# Patient Record
Sex: Female | Born: 1955
Health system: Southern US, Community
[De-identification: ages and names within clinical notes are randomized; demographics above are authoritative.]

## PROBLEM LIST (undated history)

## (undated) DIAGNOSIS — E119 Type 2 diabetes mellitus without complications: Secondary | ICD-10-CM

## (undated) DIAGNOSIS — D219 Benign neoplasm of connective and other soft tissue, unspecified: Secondary | ICD-10-CM

## (undated) DIAGNOSIS — M1990 Unspecified osteoarthritis, unspecified site: Secondary | ICD-10-CM

## (undated) DIAGNOSIS — R51 Headache: Secondary | ICD-10-CM

## (undated) DIAGNOSIS — Z8719 Personal history of other diseases of the digestive system: Secondary | ICD-10-CM

## (undated) DIAGNOSIS — Z86718 Personal history of other venous thrombosis and embolism: Secondary | ICD-10-CM

## (undated) DIAGNOSIS — Z8489 Family history of other specified conditions: Secondary | ICD-10-CM

## (undated) DIAGNOSIS — N870 Mild cervical dysplasia: Secondary | ICD-10-CM

## (undated) DIAGNOSIS — I83899 Varicose veins of unspecified lower extremities with other complications: Secondary | ICD-10-CM

## (undated) DIAGNOSIS — H269 Unspecified cataract: Secondary | ICD-10-CM

## (undated) DIAGNOSIS — L719 Rosacea, unspecified: Secondary | ICD-10-CM

## (undated) DIAGNOSIS — Z85528 Personal history of other malignant neoplasm of kidney: Secondary | ICD-10-CM

## (undated) DIAGNOSIS — E041 Nontoxic single thyroid nodule: Secondary | ICD-10-CM

## (undated) DIAGNOSIS — D131 Benign neoplasm of stomach: Secondary | ICD-10-CM

## (undated) DIAGNOSIS — R350 Frequency of micturition: Secondary | ICD-10-CM

## (undated) DIAGNOSIS — J309 Allergic rhinitis, unspecified: Secondary | ICD-10-CM

## (undated) DIAGNOSIS — E042 Nontoxic multinodular goiter: Secondary | ICD-10-CM

## (undated) DIAGNOSIS — R002 Palpitations: Secondary | ICD-10-CM

## (undated) DIAGNOSIS — E785 Hyperlipidemia, unspecified: Secondary | ICD-10-CM

## (undated) DIAGNOSIS — N2889 Other specified disorders of kidney and ureter: Secondary | ICD-10-CM

## (undated) DIAGNOSIS — F411 Generalized anxiety disorder: Secondary | ICD-10-CM

## (undated) DIAGNOSIS — R197 Diarrhea, unspecified: Secondary | ICD-10-CM

## (undated) DIAGNOSIS — D126 Benign neoplasm of colon, unspecified: Secondary | ICD-10-CM

## (undated) DIAGNOSIS — L900 Lichen sclerosus et atrophicus: Secondary | ICD-10-CM

## (undated) DIAGNOSIS — I10 Essential (primary) hypertension: Secondary | ICD-10-CM

## (undated) DIAGNOSIS — M8580 Other specified disorders of bone density and structure, unspecified site: Secondary | ICD-10-CM

## (undated) DIAGNOSIS — K219 Gastro-esophageal reflux disease without esophagitis: Secondary | ICD-10-CM

## (undated) DIAGNOSIS — N390 Urinary tract infection, site not specified: Secondary | ICD-10-CM

## (undated) HISTORY — DX: Benign neoplasm of connective and other soft tissue, unspecified: D21.9

## (undated) HISTORY — DX: Personal history of other venous thrombosis and embolism: Z86.718

## (undated) HISTORY — DX: Allergic rhinitis, unspecified: J30.9

## (undated) HISTORY — DX: Nontoxic multinodular goiter: E04.2

## (undated) HISTORY — DX: Rosacea, unspecified: L71.9

## (undated) HISTORY — DX: Diarrhea, unspecified: R19.7

## (undated) HISTORY — DX: Unspecified cataract: H26.9

## (undated) HISTORY — DX: Unspecified osteoarthritis, unspecified site: M19.90

## (undated) HISTORY — DX: Personal history of other malignant neoplasm of kidney: Z85.528

## (undated) HISTORY — DX: Benign neoplasm of stomach: D13.1

## (undated) HISTORY — PX: OTHER SURGICAL HISTORY: SHX169

## (undated) HISTORY — DX: Frequency of micturition: R35.0

## (undated) HISTORY — DX: Type 2 diabetes mellitus without complications: E11.9

## (undated) HISTORY — DX: Urinary tract infection, site not specified: N39.0

## (undated) HISTORY — DX: Benign neoplasm of colon, unspecified: D12.6

## (undated) HISTORY — DX: Palpitations: R00.2

## (undated) HISTORY — DX: Varicose veins of unspecified lower extremity with other complications: I83.899

## (undated) HISTORY — DX: Lichen sclerosus et atrophicus: L90.0

## (undated) HISTORY — DX: Mild cervical dysplasia: N87.0

## (undated) HISTORY — PX: COLPOSCOPY: SHX161

## (undated) HISTORY — DX: Essential (primary) hypertension: I10

## (undated) HISTORY — PX: BIOPSY THYROID: PRO38

## (undated) HISTORY — DX: Hyperlipidemia, unspecified: E78.5

## (undated) HISTORY — DX: Other specified disorders of bone density and structure, unspecified site: M85.80

## (undated) HISTORY — DX: Generalized anxiety disorder: F41.1

## (undated) HISTORY — DX: Gastro-esophageal reflux disease without esophagitis: K21.9

## (undated) HISTORY — DX: Nontoxic single thyroid nodule: E04.1

## (undated) HISTORY — DX: Personal history of other diseases of the digestive system: Z87.19

## (undated) HISTORY — DX: Headache: R51

---
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
## 1986-09-20 HISTORY — PX: OTHER SURGICAL HISTORY: SHX169

## 1992-09-20 HISTORY — PX: DERMOID CYST  EXCISION: SHX1452

## 1994-09-20 HISTORY — PX: OTHER SURGICAL HISTORY: SHX169

## 1995-11-16 HISTORY — PX: FLEXIBLE SIGMOIDOSCOPY: SHX1649

## 1997-12-23 ENCOUNTER — Ambulatory Visit (HOSPITAL_COMMUNITY): Admission: RE | Admit: 1997-12-23 | Discharge: 1997-12-23 | Payer: Self-pay | Admitting: Obstetrics and Gynecology

## 1999-05-29 ENCOUNTER — Encounter: Payer: Self-pay | Admitting: Obstetrics and Gynecology

## 1999-05-29 ENCOUNTER — Ambulatory Visit (HOSPITAL_COMMUNITY): Admission: RE | Admit: 1999-05-29 | Discharge: 1999-05-29 | Payer: Self-pay | Admitting: Obstetrics and Gynecology

## 1999-07-27 ENCOUNTER — Other Ambulatory Visit: Admission: RE | Admit: 1999-07-27 | Discharge: 1999-07-27 | Payer: Self-pay | Admitting: Obstetrics and Gynecology

## 1999-10-01 ENCOUNTER — Ambulatory Visit (HOSPITAL_COMMUNITY): Admission: RE | Admit: 1999-10-01 | Discharge: 1999-10-01 | Payer: Self-pay | Admitting: Gastroenterology

## 2001-01-23 ENCOUNTER — Ambulatory Visit (HOSPITAL_COMMUNITY): Admission: RE | Admit: 2001-01-23 | Discharge: 2001-01-23 | Payer: Self-pay | Admitting: Obstetrics and Gynecology

## 2001-01-23 ENCOUNTER — Encounter: Payer: Self-pay | Admitting: Obstetrics and Gynecology

## 2001-01-25 ENCOUNTER — Encounter: Admission: RE | Admit: 2001-01-25 | Discharge: 2001-01-25 | Payer: Self-pay | Admitting: Obstetrics and Gynecology

## 2001-01-25 ENCOUNTER — Encounter: Payer: Self-pay | Admitting: Obstetrics and Gynecology

## 2001-03-17 ENCOUNTER — Other Ambulatory Visit: Admission: RE | Admit: 2001-03-17 | Discharge: 2001-03-17 | Payer: Self-pay | Admitting: Obstetrics and Gynecology

## 2002-01-11 ENCOUNTER — Encounter (INDEPENDENT_AMBULATORY_CARE_PROVIDER_SITE_OTHER): Payer: Self-pay

## 2002-01-11 ENCOUNTER — Ambulatory Visit (HOSPITAL_COMMUNITY): Admission: RE | Admit: 2002-01-11 | Discharge: 2002-01-11 | Payer: Self-pay | Admitting: Obstetrics and Gynecology

## 2002-01-11 DIAGNOSIS — N840 Polyp of corpus uteri: Secondary | ICD-10-CM | POA: Insufficient documentation

## 2002-04-16 ENCOUNTER — Other Ambulatory Visit: Admission: RE | Admit: 2002-04-16 | Discharge: 2002-04-16 | Payer: Self-pay | Admitting: Obstetrics and Gynecology

## 2002-06-08 ENCOUNTER — Encounter: Admission: RE | Admit: 2002-06-08 | Discharge: 2002-06-08 | Payer: Self-pay | Admitting: Obstetrics and Gynecology

## 2002-06-08 ENCOUNTER — Encounter: Payer: Self-pay | Admitting: Obstetrics and Gynecology

## 2003-05-02 ENCOUNTER — Other Ambulatory Visit: Admission: RE | Admit: 2003-05-02 | Discharge: 2003-05-02 | Payer: Self-pay | Admitting: Obstetrics and Gynecology

## 2003-09-16 ENCOUNTER — Encounter: Admission: RE | Admit: 2003-09-16 | Discharge: 2003-09-16 | Payer: Self-pay | Admitting: Obstetrics and Gynecology

## 2003-09-21 HISTORY — PX: APPENDECTOMY: SHX54

## 2003-09-25 ENCOUNTER — Encounter: Admission: RE | Admit: 2003-09-25 | Discharge: 2003-09-25 | Payer: Self-pay | Admitting: Obstetrics and Gynecology

## 2004-02-17 ENCOUNTER — Encounter: Admission: RE | Admit: 2004-02-17 | Discharge: 2004-02-17 | Payer: Self-pay | Admitting: Internal Medicine

## 2004-05-29 ENCOUNTER — Other Ambulatory Visit: Admission: RE | Admit: 2004-05-29 | Discharge: 2004-05-29 | Payer: Self-pay | Admitting: Obstetrics and Gynecology

## 2004-08-10 ENCOUNTER — Ambulatory Visit: Payer: Self-pay | Admitting: Internal Medicine

## 2004-09-20 HISTORY — PX: OTHER SURGICAL HISTORY: SHX169

## 2004-10-14 ENCOUNTER — Ambulatory Visit: Payer: Self-pay | Admitting: Internal Medicine

## 2004-10-22 ENCOUNTER — Ambulatory Visit: Payer: Self-pay

## 2004-11-23 ENCOUNTER — Ambulatory Visit: Payer: Self-pay | Admitting: Internal Medicine

## 2004-11-23 ENCOUNTER — Encounter: Admission: RE | Admit: 2004-11-23 | Discharge: 2004-11-23 | Payer: Self-pay | Admitting: Obstetrics and Gynecology

## 2005-02-02 ENCOUNTER — Ambulatory Visit: Payer: Self-pay | Admitting: Internal Medicine

## 2005-03-10 ENCOUNTER — Ambulatory Visit: Payer: Self-pay | Admitting: Internal Medicine

## 2005-04-21 ENCOUNTER — Ambulatory Visit: Payer: Self-pay | Admitting: Internal Medicine

## 2005-04-22 ENCOUNTER — Ambulatory Visit: Payer: Self-pay | Admitting: Internal Medicine

## 2005-04-26 ENCOUNTER — Ambulatory Visit: Payer: Self-pay | Admitting: Internal Medicine

## 2005-04-29 ENCOUNTER — Ambulatory Visit: Payer: Self-pay | Admitting: Internal Medicine

## 2005-05-04 ENCOUNTER — Ambulatory Visit: Payer: Self-pay | Admitting: Internal Medicine

## 2005-05-07 ENCOUNTER — Ambulatory Visit: Payer: Self-pay | Admitting: Internal Medicine

## 2005-05-10 ENCOUNTER — Ambulatory Visit: Payer: Self-pay | Admitting: Internal Medicine

## 2005-05-14 ENCOUNTER — Ambulatory Visit: Payer: Self-pay | Admitting: Internal Medicine

## 2005-05-18 ENCOUNTER — Ambulatory Visit: Payer: Self-pay | Admitting: Internal Medicine

## 2005-05-21 ENCOUNTER — Ambulatory Visit: Payer: Self-pay | Admitting: Internal Medicine

## 2005-05-25 ENCOUNTER — Ambulatory Visit: Payer: Self-pay | Admitting: Internal Medicine

## 2005-05-28 ENCOUNTER — Ambulatory Visit: Payer: Self-pay | Admitting: Internal Medicine

## 2005-06-01 ENCOUNTER — Ambulatory Visit: Payer: Self-pay | Admitting: Internal Medicine

## 2005-06-04 ENCOUNTER — Ambulatory Visit: Payer: Self-pay | Admitting: Internal Medicine

## 2005-06-07 ENCOUNTER — Ambulatory Visit: Payer: Self-pay | Admitting: Internal Medicine

## 2005-06-09 ENCOUNTER — Other Ambulatory Visit: Admission: RE | Admit: 2005-06-09 | Discharge: 2005-06-09 | Payer: Self-pay | Admitting: Obstetrics and Gynecology

## 2005-06-09 ENCOUNTER — Ambulatory Visit: Payer: Self-pay | Admitting: Internal Medicine

## 2005-06-11 ENCOUNTER — Ambulatory Visit: Payer: Self-pay | Admitting: Internal Medicine

## 2005-06-14 ENCOUNTER — Ambulatory Visit: Payer: Self-pay | Admitting: Internal Medicine

## 2005-06-21 ENCOUNTER — Ambulatory Visit: Payer: Self-pay | Admitting: Internal Medicine

## 2005-06-25 ENCOUNTER — Ambulatory Visit: Payer: Self-pay | Admitting: Internal Medicine

## 2005-06-28 ENCOUNTER — Ambulatory Visit: Payer: Self-pay | Admitting: Internal Medicine

## 2005-06-29 ENCOUNTER — Ambulatory Visit: Payer: Self-pay | Admitting: Internal Medicine

## 2005-07-02 ENCOUNTER — Ambulatory Visit: Payer: Self-pay | Admitting: Internal Medicine

## 2005-07-05 ENCOUNTER — Ambulatory Visit: Payer: Self-pay | Admitting: Internal Medicine

## 2005-07-09 ENCOUNTER — Ambulatory Visit: Payer: Self-pay | Admitting: Internal Medicine

## 2005-07-12 ENCOUNTER — Ambulatory Visit: Payer: Self-pay | Admitting: Internal Medicine

## 2005-07-16 ENCOUNTER — Ambulatory Visit: Payer: Self-pay | Admitting: Internal Medicine

## 2005-07-19 ENCOUNTER — Ambulatory Visit: Payer: Self-pay | Admitting: Internal Medicine

## 2005-07-23 ENCOUNTER — Ambulatory Visit: Payer: Self-pay | Admitting: Internal Medicine

## 2005-07-26 ENCOUNTER — Ambulatory Visit: Payer: Self-pay | Admitting: Internal Medicine

## 2005-07-30 ENCOUNTER — Ambulatory Visit: Payer: Self-pay | Admitting: Internal Medicine

## 2005-08-02 ENCOUNTER — Ambulatory Visit: Payer: Self-pay | Admitting: Internal Medicine

## 2005-08-09 ENCOUNTER — Ambulatory Visit: Payer: Self-pay | Admitting: Internal Medicine

## 2005-08-16 ENCOUNTER — Ambulatory Visit: Payer: Self-pay | Admitting: Internal Medicine

## 2005-08-23 ENCOUNTER — Ambulatory Visit: Payer: Self-pay | Admitting: Internal Medicine

## 2005-08-30 ENCOUNTER — Ambulatory Visit: Payer: Self-pay | Admitting: Internal Medicine

## 2005-09-02 ENCOUNTER — Ambulatory Visit: Payer: Self-pay | Admitting: Internal Medicine

## 2005-09-06 ENCOUNTER — Ambulatory Visit: Payer: Self-pay | Admitting: Internal Medicine

## 2005-09-14 ENCOUNTER — Ambulatory Visit: Payer: Self-pay | Admitting: Internal Medicine

## 2005-09-20 HISTORY — PX: CYSTOSCOPY: SUR368

## 2005-09-21 ENCOUNTER — Ambulatory Visit: Payer: Self-pay | Admitting: Internal Medicine

## 2005-09-27 ENCOUNTER — Ambulatory Visit: Payer: Self-pay | Admitting: Internal Medicine

## 2005-09-30 ENCOUNTER — Ambulatory Visit: Payer: Self-pay | Admitting: Internal Medicine

## 2005-10-04 ENCOUNTER — Ambulatory Visit: Payer: Self-pay | Admitting: Internal Medicine

## 2005-10-14 ENCOUNTER — Ambulatory Visit: Payer: Self-pay | Admitting: Internal Medicine

## 2005-10-21 ENCOUNTER — Ambulatory Visit: Payer: Self-pay | Admitting: Internal Medicine

## 2005-10-28 ENCOUNTER — Ambulatory Visit: Payer: Self-pay | Admitting: Internal Medicine

## 2005-11-08 ENCOUNTER — Ambulatory Visit: Payer: Self-pay | Admitting: Internal Medicine

## 2005-11-11 ENCOUNTER — Ambulatory Visit: Payer: Self-pay | Admitting: Internal Medicine

## 2005-11-17 ENCOUNTER — Ambulatory Visit: Payer: Self-pay | Admitting: Internal Medicine

## 2005-11-26 ENCOUNTER — Ambulatory Visit: Payer: Self-pay | Admitting: Internal Medicine

## 2005-12-03 ENCOUNTER — Ambulatory Visit: Payer: Self-pay | Admitting: Internal Medicine

## 2005-12-10 ENCOUNTER — Ambulatory Visit: Payer: Self-pay | Admitting: Internal Medicine

## 2005-12-13 ENCOUNTER — Ambulatory Visit: Payer: Self-pay | Admitting: Internal Medicine

## 2005-12-14 ENCOUNTER — Encounter: Admission: RE | Admit: 2005-12-14 | Discharge: 2005-12-14 | Payer: Self-pay | Admitting: Obstetrics and Gynecology

## 2005-12-22 ENCOUNTER — Ambulatory Visit: Payer: Self-pay | Admitting: Internal Medicine

## 2005-12-28 ENCOUNTER — Ambulatory Visit: Payer: Self-pay | Admitting: Internal Medicine

## 2006-01-07 ENCOUNTER — Ambulatory Visit: Payer: Self-pay | Admitting: Internal Medicine

## 2006-01-14 ENCOUNTER — Ambulatory Visit: Payer: Self-pay | Admitting: Internal Medicine

## 2006-01-21 ENCOUNTER — Ambulatory Visit: Payer: Self-pay | Admitting: Internal Medicine

## 2006-01-25 ENCOUNTER — Ambulatory Visit: Payer: Self-pay | Admitting: Internal Medicine

## 2006-01-25 ENCOUNTER — Ambulatory Visit: Payer: Self-pay | Admitting: *Deleted

## 2006-02-11 ENCOUNTER — Ambulatory Visit: Payer: Self-pay | Admitting: Internal Medicine

## 2006-02-15 ENCOUNTER — Ambulatory Visit: Payer: Self-pay | Admitting: Internal Medicine

## 2006-02-25 ENCOUNTER — Ambulatory Visit: Payer: Self-pay | Admitting: Internal Medicine

## 2006-03-03 ENCOUNTER — Ambulatory Visit: Payer: Self-pay | Admitting: Internal Medicine

## 2006-03-11 ENCOUNTER — Ambulatory Visit: Payer: Self-pay | Admitting: Internal Medicine

## 2006-03-24 ENCOUNTER — Ambulatory Visit: Payer: Self-pay | Admitting: Internal Medicine

## 2006-04-01 ENCOUNTER — Ambulatory Visit: Payer: Self-pay | Admitting: Internal Medicine

## 2006-04-08 ENCOUNTER — Ambulatory Visit: Payer: Self-pay | Admitting: Internal Medicine

## 2006-04-15 ENCOUNTER — Ambulatory Visit: Payer: Self-pay | Admitting: Internal Medicine

## 2006-04-29 ENCOUNTER — Ambulatory Visit: Payer: Self-pay | Admitting: Internal Medicine

## 2006-05-06 ENCOUNTER — Ambulatory Visit: Payer: Self-pay | Admitting: Internal Medicine

## 2006-05-13 ENCOUNTER — Ambulatory Visit: Payer: Self-pay | Admitting: Internal Medicine

## 2006-05-20 ENCOUNTER — Ambulatory Visit: Payer: Self-pay | Admitting: Internal Medicine

## 2006-05-24 ENCOUNTER — Ambulatory Visit: Payer: Self-pay | Admitting: Internal Medicine

## 2006-06-10 ENCOUNTER — Other Ambulatory Visit: Admission: RE | Admit: 2006-06-10 | Discharge: 2006-06-10 | Payer: Self-pay | Admitting: Obstetrics and Gynecology

## 2006-06-10 ENCOUNTER — Ambulatory Visit: Payer: Self-pay | Admitting: Internal Medicine

## 2006-06-22 ENCOUNTER — Ambulatory Visit: Payer: Self-pay | Admitting: Internal Medicine

## 2006-06-30 ENCOUNTER — Ambulatory Visit: Payer: Self-pay | Admitting: Internal Medicine

## 2006-07-08 ENCOUNTER — Ambulatory Visit: Payer: Self-pay | Admitting: Internal Medicine

## 2006-07-22 ENCOUNTER — Ambulatory Visit: Payer: Self-pay | Admitting: Internal Medicine

## 2006-07-28 ENCOUNTER — Ambulatory Visit: Payer: Self-pay | Admitting: Internal Medicine

## 2006-08-05 ENCOUNTER — Ambulatory Visit: Payer: Self-pay | Admitting: Internal Medicine

## 2006-08-17 ENCOUNTER — Ambulatory Visit: Payer: Self-pay | Admitting: Internal Medicine

## 2006-08-22 ENCOUNTER — Ambulatory Visit: Payer: Self-pay | Admitting: Internal Medicine

## 2006-08-29 ENCOUNTER — Ambulatory Visit: Payer: Self-pay | Admitting: Internal Medicine

## 2006-09-02 ENCOUNTER — Ambulatory Visit: Payer: Self-pay | Admitting: Internal Medicine

## 2006-09-09 ENCOUNTER — Ambulatory Visit: Payer: Self-pay | Admitting: Internal Medicine

## 2006-09-16 ENCOUNTER — Ambulatory Visit: Payer: Self-pay | Admitting: Internal Medicine

## 2006-09-23 ENCOUNTER — Ambulatory Visit: Payer: Self-pay | Admitting: Internal Medicine

## 2006-09-30 ENCOUNTER — Ambulatory Visit: Payer: Self-pay | Admitting: Internal Medicine

## 2006-10-03 ENCOUNTER — Ambulatory Visit (HOSPITAL_COMMUNITY): Admission: RE | Admit: 2006-10-03 | Discharge: 2006-10-04 | Payer: Self-pay | Admitting: Urology

## 2006-10-14 ENCOUNTER — Ambulatory Visit: Payer: Self-pay | Admitting: Internal Medicine

## 2006-10-18 ENCOUNTER — Ambulatory Visit: Payer: Self-pay | Admitting: Internal Medicine

## 2006-11-04 ENCOUNTER — Ambulatory Visit: Payer: Self-pay | Admitting: Internal Medicine

## 2006-11-08 ENCOUNTER — Ambulatory Visit: Payer: Self-pay | Admitting: Pulmonary Disease

## 2006-11-08 LAB — CONVERTED CEMR LAB
ALT: 48 units/L — ABNORMAL HIGH (ref 0–40)
AST: 45 units/L — ABNORMAL HIGH (ref 0–37)
Albumin: 4.3 g/dL (ref 3.5–5.2)
Alkaline Phosphatase: 95 units/L (ref 39–117)
BUN: 9 mg/dL (ref 6–23)
Basophils Absolute: 0.1 10*3/uL (ref 0.0–0.1)
Basophils Relative: 1.1 % — ABNORMAL HIGH (ref 0.0–1.0)
Bilirubin, Direct: 0.1 mg/dL (ref 0.0–0.3)
CO2: 29 meq/L (ref 19–32)
Calcium: 10.2 mg/dL (ref 8.4–10.5)
Chloride: 105 meq/L (ref 96–112)
Creatinine, Ser: 0.7 mg/dL (ref 0.4–1.2)
Eosinophils Absolute: 0.2 10*3/uL (ref 0.0–0.6)
Eosinophils Relative: 2.6 % (ref 0.0–5.0)
GFR calc Af Amer: 114 mL/min
GFR calc non Af Amer: 94 mL/min
Glucose, Bld: 96 mg/dL (ref 70–99)
HCT: 36.7 % (ref 36.0–46.0)
Hemoglobin: 12.9 g/dL (ref 12.0–15.0)
Lymphocytes Relative: 29.8 % (ref 12.0–46.0)
MCHC: 35.2 g/dL (ref 30.0–36.0)
MCV: 81.6 fL (ref 78.0–100.0)
Mono Screen: NEGATIVE
Monocytes Absolute: 0.6 10*3/uL (ref 0.2–0.7)
Monocytes Relative: 7.6 % (ref 3.0–11.0)
Neutro Abs: 5.1 10*3/uL (ref 1.4–7.7)
Neutrophils Relative %: 58.9 % (ref 43.0–77.0)
Platelets: 260 10*3/uL (ref 150–400)
Potassium: 4.1 meq/L (ref 3.5–5.1)
RBC: 4.5 M/uL (ref 3.87–5.11)
RDW: 13.3 % (ref 11.5–14.6)
Sodium: 143 meq/L (ref 135–145)
Streptococcus, Group A Screen (Direct): NEGATIVE
Streptococcus, Group A Screen (Direct): NEGATIVE
TSH: 1.5 microintl units/mL (ref 0.35–5.50)
Total Bilirubin: 0.6 mg/dL (ref 0.3–1.2)
Total Protein: 7.4 g/dL (ref 6.0–8.3)
WBC: 8.5 10*3/uL (ref 4.5–10.5)

## 2006-11-11 ENCOUNTER — Ambulatory Visit: Payer: Self-pay | Admitting: Internal Medicine

## 2006-11-15 ENCOUNTER — Ambulatory Visit: Payer: Self-pay | Admitting: Internal Medicine

## 2006-11-16 ENCOUNTER — Ambulatory Visit: Payer: Self-pay | Admitting: Internal Medicine

## 2006-11-16 LAB — CONVERTED CEMR LAB
Bilirubin Urine: NEGATIVE
Cholesterol: 180 mg/dL (ref 0–200)
Crystals: NEGATIVE
Direct LDL: 107.6 mg/dL
HDL: 42.2 mg/dL (ref >39.0)
Ketones, ur: NEGATIVE mg/dL
Mucus, UA: NEGATIVE
Nitrite: NEGATIVE
RBC / HPF: NONE SEEN
Specific Gravity, Urine: <=1.005 (ref 1.000–1.03)
Total CHOL/HDL Ratio: 4.3
Total Protein, Urine: NEGATIVE mg/dL
Triglycerides: 283 mg/dL (ref 0–149)
Urine Glucose: NEGATIVE mg/dL
Urobilinogen, UA: 0.2 (ref 0.0–1.0)
VLDL: 57 mg/dL — ABNORMAL HIGH (ref 0–40)
pH: 6 (ref 5.0–8.0)

## 2006-11-21 ENCOUNTER — Ambulatory Visit: Payer: Self-pay | Admitting: Internal Medicine

## 2006-11-25 ENCOUNTER — Ambulatory Visit: Payer: Self-pay | Admitting: Internal Medicine

## 2006-12-02 ENCOUNTER — Ambulatory Visit: Payer: Self-pay | Admitting: Internal Medicine

## 2006-12-06 ENCOUNTER — Encounter: Payer: Self-pay | Admitting: Cardiology

## 2006-12-06 ENCOUNTER — Ambulatory Visit: Payer: Self-pay

## 2006-12-08 ENCOUNTER — Ambulatory Visit: Payer: Self-pay | Admitting: Internal Medicine

## 2006-12-16 ENCOUNTER — Ambulatory Visit: Payer: Self-pay | Admitting: Internal Medicine

## 2006-12-23 ENCOUNTER — Ambulatory Visit: Payer: Self-pay | Admitting: Internal Medicine

## 2006-12-27 ENCOUNTER — Ambulatory Visit: Payer: Self-pay | Admitting: Internal Medicine

## 2007-01-03 ENCOUNTER — Ambulatory Visit: Payer: Self-pay | Admitting: Internal Medicine

## 2007-01-03 ENCOUNTER — Encounter: Admission: RE | Admit: 2007-01-03 | Discharge: 2007-01-03 | Payer: Self-pay | Admitting: Obstetrics and Gynecology

## 2007-01-10 ENCOUNTER — Ambulatory Visit: Payer: Self-pay | Admitting: Internal Medicine

## 2007-01-20 ENCOUNTER — Ambulatory Visit: Payer: Self-pay | Admitting: Internal Medicine

## 2007-02-03 ENCOUNTER — Ambulatory Visit: Payer: Self-pay | Admitting: Internal Medicine

## 2007-02-16 ENCOUNTER — Ambulatory Visit: Payer: Self-pay | Admitting: Internal Medicine

## 2007-02-24 ENCOUNTER — Ambulatory Visit: Payer: Self-pay | Admitting: Internal Medicine

## 2007-03-08 ENCOUNTER — Ambulatory Visit: Payer: Self-pay | Admitting: Internal Medicine

## 2007-03-08 LAB — CONVERTED CEMR LAB
Bilirubin Urine: NEGATIVE
Crystals: NEGATIVE
Hemoglobin, Urine: NEGATIVE
Ketones, ur: NEGATIVE mg/dL
Leukocytes, UA: NEGATIVE
Mucus, UA: NEGATIVE
Nitrite: NEGATIVE
RBC / HPF: NONE SEEN
Specific Gravity, Urine: 1.02 (ref 1.000–1.03)
Total Protein, Urine: NEGATIVE mg/dL
Urine Glucose: NEGATIVE mg/dL
Urobilinogen, UA: 0.2 (ref 0.0–1.0)
pH: 6 (ref 5.0–8.0)

## 2007-03-23 ENCOUNTER — Ambulatory Visit: Payer: Self-pay | Admitting: Internal Medicine

## 2007-04-06 ENCOUNTER — Encounter: Payer: Self-pay | Admitting: Endocrinology

## 2007-04-06 DIAGNOSIS — J309 Allergic rhinitis, unspecified: Secondary | ICD-10-CM

## 2007-04-06 DIAGNOSIS — E785 Hyperlipidemia, unspecified: Secondary | ICD-10-CM | POA: Insufficient documentation

## 2007-04-06 DIAGNOSIS — F411 Generalized anxiety disorder: Secondary | ICD-10-CM

## 2007-04-06 DIAGNOSIS — I10 Essential (primary) hypertension: Secondary | ICD-10-CM | POA: Insufficient documentation

## 2007-04-06 HISTORY — DX: Essential (primary) hypertension: I10

## 2007-04-06 HISTORY — DX: Hyperlipidemia, unspecified: E78.5

## 2007-04-06 HISTORY — DX: Generalized anxiety disorder: F41.1

## 2007-04-06 HISTORY — DX: Allergic rhinitis, unspecified: J30.9

## 2007-04-07 ENCOUNTER — Ambulatory Visit: Payer: Self-pay | Admitting: Internal Medicine

## 2007-04-14 ENCOUNTER — Ambulatory Visit: Payer: Self-pay | Admitting: Internal Medicine

## 2007-04-26 ENCOUNTER — Ambulatory Visit: Payer: Self-pay | Admitting: Internal Medicine

## 2007-04-28 ENCOUNTER — Ambulatory Visit: Payer: Self-pay | Admitting: Internal Medicine

## 2007-04-28 LAB — CONVERTED CEMR LAB
ALT: 54 units/L — ABNORMAL HIGH (ref 0–35)
AST: 52 units/L — ABNORMAL HIGH (ref 0–37)
Albumin: 4.4 g/dL (ref 3.5–5.2)
Alkaline Phosphatase: 84 units/L (ref 39–117)
Amylase: 76 units/L (ref 27–131)
BUN: 12 mg/dL (ref 6–23)
Basophils Absolute: 0.1 10*3/uL (ref 0.0–0.1)
Basophils Relative: 1 % (ref 0.0–1.0)
Bilirubin Urine: NEGATIVE
Bilirubin, Direct: 0.1 mg/dL (ref 0.0–0.3)
CO2: 29 meq/L (ref 19–32)
Calcium: 9.9 mg/dL (ref 8.4–10.5)
Chloride: 103 meq/L (ref 96–112)
Creatinine, Ser: 0.6 mg/dL (ref 0.4–1.2)
Crystals: NEGATIVE
Eosinophils Absolute: 0.2 10*3/uL (ref 0.0–0.6)
Eosinophils Relative: 3.1 % (ref 0.0–5.0)
GFR calc Af Amer: 136 mL/min
GFR calc non Af Amer: 112 mL/min
Glucose, Bld: 121 mg/dL — ABNORMAL HIGH (ref 70–99)
H Pylori IgG: NEGATIVE
HCT: 34.7 % — ABNORMAL LOW (ref 36.0–46.0)
Hemoglobin, Urine: NEGATIVE
Hemoglobin: 12 g/dL (ref 12.0–15.0)
Ketones, ur: NEGATIVE mg/dL
Lipase: 40 units/L (ref 11.0–59.0)
Lymphocytes Relative: 34.5 % (ref 12.0–46.0)
MCHC: 34.6 g/dL (ref 30.0–36.0)
MCV: 82 fL (ref 78.0–100.0)
Monocytes Absolute: 0.6 10*3/uL (ref 0.2–0.7)
Monocytes Relative: 8.1 % (ref 3.0–11.0)
Mucus, UA: NEGATIVE
Neutro Abs: 3.9 10*3/uL (ref 1.4–7.7)
Neutrophils Relative %: 53.3 % (ref 43.0–77.0)
Nitrite: NEGATIVE
Platelets: 263 10*3/uL (ref 150–400)
Potassium: 3.8 meq/L (ref 3.5–5.1)
RBC: 4.24 M/uL (ref 3.87–5.11)
RDW: 13.8 % (ref 11.5–14.6)
Sodium: 140 meq/L (ref 135–145)
Specific Gravity, Urine: 1.01 (ref 1.000–1.03)
Total Bilirubin: 0.6 mg/dL (ref 0.3–1.2)
Total Protein, Urine: NEGATIVE mg/dL
Total Protein: 7.2 g/dL (ref 6.0–8.3)
Urine Glucose: NEGATIVE mg/dL
Urobilinogen, UA: 0.2 (ref 0.0–1.0)
WBC: 7.3 10*3/uL (ref 4.5–10.5)
pH: 6 (ref 5.0–8.0)

## 2007-05-05 ENCOUNTER — Ambulatory Visit: Payer: Self-pay | Admitting: Internal Medicine

## 2007-05-08 ENCOUNTER — Ambulatory Visit: Payer: Self-pay | Admitting: Internal Medicine

## 2007-05-08 ENCOUNTER — Encounter: Admission: RE | Admit: 2007-05-08 | Discharge: 2007-05-08 | Payer: Self-pay | Admitting: Internal Medicine

## 2007-05-19 ENCOUNTER — Ambulatory Visit: Payer: Self-pay | Admitting: Internal Medicine

## 2007-05-26 ENCOUNTER — Ambulatory Visit: Payer: Self-pay | Admitting: Internal Medicine

## 2007-06-09 ENCOUNTER — Ambulatory Visit: Payer: Self-pay | Admitting: Internal Medicine

## 2007-06-12 ENCOUNTER — Other Ambulatory Visit: Admission: RE | Admit: 2007-06-12 | Discharge: 2007-06-12 | Payer: Self-pay | Admitting: Obstetrics and Gynecology

## 2007-06-20 ENCOUNTER — Ambulatory Visit: Payer: Self-pay | Admitting: Internal Medicine

## 2007-07-06 DIAGNOSIS — Z8719 Personal history of other diseases of the digestive system: Secondary | ICD-10-CM | POA: Insufficient documentation

## 2007-07-06 HISTORY — DX: Personal history of other diseases of the digestive system: Z87.19

## 2007-07-07 ENCOUNTER — Ambulatory Visit: Payer: Self-pay | Admitting: Internal Medicine

## 2007-07-14 ENCOUNTER — Ambulatory Visit: Payer: Self-pay | Admitting: Internal Medicine

## 2007-07-28 ENCOUNTER — Ambulatory Visit: Payer: Self-pay | Admitting: Internal Medicine

## 2007-08-04 ENCOUNTER — Ambulatory Visit: Payer: Self-pay | Admitting: Internal Medicine

## 2007-08-04 ENCOUNTER — Encounter: Payer: Self-pay | Admitting: Internal Medicine

## 2007-08-04 DIAGNOSIS — K219 Gastro-esophageal reflux disease without esophagitis: Secondary | ICD-10-CM

## 2007-08-04 DIAGNOSIS — D126 Benign neoplasm of colon, unspecified: Secondary | ICD-10-CM

## 2007-08-04 DIAGNOSIS — D131 Benign neoplasm of stomach: Secondary | ICD-10-CM | POA: Insufficient documentation

## 2007-08-04 HISTORY — DX: Benign neoplasm of stomach: D13.1

## 2007-08-04 HISTORY — DX: Gastro-esophageal reflux disease without esophagitis: K21.9

## 2007-08-04 HISTORY — DX: Benign neoplasm of colon, unspecified: D12.6

## 2007-08-10 LAB — HM COLONOSCOPY

## 2007-08-11 ENCOUNTER — Ambulatory Visit: Payer: Self-pay | Admitting: Internal Medicine

## 2007-08-24 ENCOUNTER — Ambulatory Visit: Payer: Self-pay | Admitting: Internal Medicine

## 2007-09-01 ENCOUNTER — Encounter: Admission: RE | Admit: 2007-09-01 | Discharge: 2007-09-01 | Payer: Self-pay | Admitting: Otolaryngology

## 2007-09-01 ENCOUNTER — Ambulatory Visit: Payer: Self-pay | Admitting: Internal Medicine

## 2007-09-07 ENCOUNTER — Ambulatory Visit: Payer: Self-pay | Admitting: Internal Medicine

## 2007-09-15 ENCOUNTER — Ambulatory Visit: Payer: Self-pay | Admitting: Internal Medicine

## 2007-09-25 ENCOUNTER — Ambulatory Visit (HOSPITAL_COMMUNITY): Admission: RE | Admit: 2007-09-25 | Discharge: 2007-09-25 | Payer: Self-pay | Admitting: Internal Medicine

## 2007-10-06 ENCOUNTER — Ambulatory Visit: Payer: Self-pay | Admitting: Internal Medicine

## 2007-10-20 ENCOUNTER — Ambulatory Visit: Payer: Self-pay | Admitting: Internal Medicine

## 2007-10-25 ENCOUNTER — Ambulatory Visit: Payer: Self-pay | Admitting: Internal Medicine

## 2007-10-30 ENCOUNTER — Ambulatory Visit: Payer: Self-pay | Admitting: Internal Medicine

## 2007-11-06 ENCOUNTER — Ambulatory Visit: Payer: Self-pay | Admitting: Internal Medicine

## 2007-11-06 ENCOUNTER — Ambulatory Visit: Payer: Self-pay | Admitting: Pulmonary Disease

## 2007-11-15 ENCOUNTER — Telehealth (INDEPENDENT_AMBULATORY_CARE_PROVIDER_SITE_OTHER): Payer: Self-pay | Admitting: *Deleted

## 2007-11-17 ENCOUNTER — Ambulatory Visit: Payer: Self-pay | Admitting: Internal Medicine

## 2007-11-21 ENCOUNTER — Encounter: Payer: Self-pay | Admitting: Internal Medicine

## 2007-11-30 DIAGNOSIS — N946 Dysmenorrhea, unspecified: Secondary | ICD-10-CM | POA: Insufficient documentation

## 2007-11-30 DIAGNOSIS — N390 Urinary tract infection, site not specified: Secondary | ICD-10-CM | POA: Insufficient documentation

## 2007-11-30 DIAGNOSIS — L719 Rosacea, unspecified: Secondary | ICD-10-CM | POA: Insufficient documentation

## 2007-11-30 DIAGNOSIS — N809 Endometriosis, unspecified: Secondary | ICD-10-CM | POA: Insufficient documentation

## 2007-11-30 HISTORY — DX: Urinary tract infection, site not specified: N39.0

## 2007-11-30 HISTORY — DX: Rosacea, unspecified: L71.9

## 2007-12-15 ENCOUNTER — Ambulatory Visit: Payer: Self-pay | Admitting: Internal Medicine

## 2007-12-22 ENCOUNTER — Ambulatory Visit: Payer: Self-pay | Admitting: Internal Medicine

## 2007-12-28 ENCOUNTER — Ambulatory Visit: Payer: Self-pay | Admitting: Internal Medicine

## 2008-01-02 ENCOUNTER — Ambulatory Visit: Payer: Self-pay | Admitting: Internal Medicine

## 2008-01-03 ENCOUNTER — Ambulatory Visit: Payer: Self-pay | Admitting: Internal Medicine

## 2008-01-04 ENCOUNTER — Encounter: Admission: RE | Admit: 2008-01-04 | Discharge: 2008-01-04 | Payer: Self-pay | Admitting: Obstetrics and Gynecology

## 2008-01-17 ENCOUNTER — Ambulatory Visit: Payer: Self-pay | Admitting: Internal Medicine

## 2008-01-30 ENCOUNTER — Encounter: Payer: Self-pay | Admitting: Internal Medicine

## 2008-02-02 ENCOUNTER — Ambulatory Visit: Payer: Self-pay | Admitting: Internal Medicine

## 2008-02-15 ENCOUNTER — Ambulatory Visit: Payer: Self-pay | Admitting: Internal Medicine

## 2008-03-08 ENCOUNTER — Ambulatory Visit: Payer: Self-pay | Admitting: Internal Medicine

## 2008-04-19 ENCOUNTER — Ambulatory Visit: Payer: Self-pay | Admitting: Internal Medicine

## 2008-05-03 ENCOUNTER — Ambulatory Visit: Payer: Self-pay | Admitting: Internal Medicine

## 2008-05-17 ENCOUNTER — Ambulatory Visit: Payer: Self-pay | Admitting: Internal Medicine

## 2008-05-31 ENCOUNTER — Ambulatory Visit: Payer: Self-pay | Admitting: Internal Medicine

## 2008-06-07 ENCOUNTER — Ambulatory Visit: Payer: Self-pay | Admitting: Internal Medicine

## 2008-06-21 ENCOUNTER — Ambulatory Visit: Payer: Self-pay | Admitting: Internal Medicine

## 2008-06-28 ENCOUNTER — Ambulatory Visit: Payer: Self-pay | Admitting: Internal Medicine

## 2008-07-04 DIAGNOSIS — Z860100 Personal history of colon polyps, unspecified: Secondary | ICD-10-CM | POA: Insufficient documentation

## 2008-07-05 ENCOUNTER — Ambulatory Visit: Payer: Self-pay | Admitting: Internal Medicine

## 2008-07-08 LAB — CONVERTED CEMR LAB
ALT: 49 units/L — ABNORMAL HIGH (ref 0–35)
AST: 36 units/L (ref 0–37)
Albumin: 4 g/dL (ref 3.5–5.2)
Alkaline Phosphatase: 100 units/L (ref 39–117)
BUN: 14 mg/dL (ref 6–23)
Basophils Absolute: 0.1 10*3/uL (ref 0.0–0.1)
Basophils Relative: 1 % (ref 0.0–3.0)
Bilirubin Urine: NEGATIVE
Bilirubin, Direct: 0.1 mg/dL (ref 0.0–0.3)
CO2: 28 meq/L (ref 19–32)
Calcium: 9.1 mg/dL (ref 8.4–10.5)
Chloride: 105 meq/L (ref 96–112)
Cholesterol: 117 mg/dL (ref 0–200)
Creatinine, Ser: 0.7 mg/dL (ref 0.4–1.2)
Crystals: NEGATIVE
Direct LDL: 56.4 mg/dL
Eosinophils Absolute: 0.2 10*3/uL (ref 0.0–0.7)
Eosinophils Relative: 3.6 % (ref 0.0–5.0)
GFR calc Af Amer: 113 mL/min
GFR calc non Af Amer: 93 mL/min
Glucose, Bld: 105 mg/dL — ABNORMAL HIGH (ref 70–99)
HCT: 37 % (ref 36.0–46.0)
HDL: 27.6 mg/dL — ABNORMAL LOW (ref >39.0)
Hemoglobin, Urine: NEGATIVE
Hemoglobin: 12.4 g/dL (ref 12.0–15.0)
Ketones, ur: NEGATIVE mg/dL
Lymphocytes Relative: 42.7 % (ref 12.0–46.0)
MCHC: 33.4 g/dL (ref 30.0–36.0)
MCV: 80.1 fL (ref 78.0–100.0)
Monocytes Absolute: 0.5 10*3/uL (ref 0.1–1.0)
Monocytes Relative: 8.2 % (ref 3.0–12.0)
Mucus, UA: NEGATIVE
Neutro Abs: 2.8 10*3/uL (ref 1.4–7.7)
Neutrophils Relative %: 44.5 % (ref 43.0–77.0)
Nitrite: NEGATIVE
Platelets: 246 10*3/uL (ref 150–400)
Potassium: 3.5 meq/L (ref 3.5–5.1)
RBC / HPF: NONE SEEN
RBC: 4.62 M/uL (ref 3.87–5.11)
RDW: 13.2 % (ref 11.5–14.6)
Sodium: 141 meq/L (ref 135–145)
Specific Gravity, Urine: <=1.005 (ref 1.000–1.03)
TSH: 0.87 microintl units/mL (ref 0.35–5.50)
Total Bilirubin: 0.5 mg/dL (ref 0.3–1.2)
Total CHOL/HDL Ratio: 4.2
Total Protein, Urine: NEGATIVE mg/dL
Total Protein: 7.1 g/dL (ref 6.0–8.3)
Triglycerides: 264 mg/dL (ref 0–149)
Urine Glucose: NEGATIVE mg/dL
Urobilinogen, UA: 0.2 (ref 0.0–1.0)
VLDL: 53 mg/dL — ABNORMAL HIGH (ref 0–40)
Vit D, 1,25-Dihydroxy: 58 (ref 30–89)
WBC: 6.2 10*3/uL (ref 4.5–10.5)
pH: 5.5 (ref 5.0–8.0)

## 2008-07-11 ENCOUNTER — Encounter: Payer: Self-pay | Admitting: Internal Medicine

## 2008-07-11 ENCOUNTER — Encounter: Admission: RE | Admit: 2008-07-11 | Discharge: 2008-07-11 | Payer: Self-pay | Admitting: Internal Medicine

## 2008-07-18 ENCOUNTER — Ambulatory Visit: Payer: Self-pay | Admitting: Internal Medicine

## 2008-07-18 ENCOUNTER — Other Ambulatory Visit: Admission: RE | Admit: 2008-07-18 | Discharge: 2008-07-18 | Payer: Self-pay | Admitting: Obstetrics and Gynecology

## 2008-07-18 ENCOUNTER — Encounter: Payer: Self-pay | Admitting: Obstetrics and Gynecology

## 2008-07-18 ENCOUNTER — Ambulatory Visit: Payer: Self-pay | Admitting: Obstetrics and Gynecology

## 2008-07-26 ENCOUNTER — Ambulatory Visit: Payer: Self-pay | Admitting: Internal Medicine

## 2008-07-30 ENCOUNTER — Encounter: Admission: RE | Admit: 2008-07-30 | Discharge: 2008-07-30 | Payer: Self-pay | Admitting: Internal Medicine

## 2008-07-30 ENCOUNTER — Encounter (INDEPENDENT_AMBULATORY_CARE_PROVIDER_SITE_OTHER): Payer: Self-pay | Admitting: Interventional Radiology

## 2008-07-30 ENCOUNTER — Other Ambulatory Visit: Admission: RE | Admit: 2008-07-30 | Discharge: 2008-07-30 | Payer: Self-pay | Admitting: Interventional Radiology

## 2008-07-30 ENCOUNTER — Encounter: Payer: Self-pay | Admitting: Internal Medicine

## 2008-08-02 ENCOUNTER — Ambulatory Visit: Payer: Self-pay | Admitting: Internal Medicine

## 2008-08-06 ENCOUNTER — Telehealth: Payer: Self-pay | Admitting: Internal Medicine

## 2008-08-07 ENCOUNTER — Telehealth: Payer: Self-pay | Admitting: Internal Medicine

## 2008-08-08 ENCOUNTER — Ambulatory Visit: Payer: Self-pay | Admitting: Internal Medicine

## 2008-08-22 ENCOUNTER — Telehealth (INDEPENDENT_AMBULATORY_CARE_PROVIDER_SITE_OTHER): Payer: Self-pay | Admitting: *Deleted

## 2008-08-30 ENCOUNTER — Ambulatory Visit: Payer: Self-pay | Admitting: Internal Medicine

## 2008-09-02 ENCOUNTER — Ambulatory Visit: Payer: Self-pay | Admitting: Internal Medicine

## 2008-09-11 ENCOUNTER — Ambulatory Visit: Payer: Self-pay | Admitting: Internal Medicine

## 2008-09-11 DIAGNOSIS — J019 Acute sinusitis, unspecified: Secondary | ICD-10-CM | POA: Insufficient documentation

## 2008-09-11 DIAGNOSIS — R350 Frequency of micturition: Secondary | ICD-10-CM | POA: Insufficient documentation

## 2008-09-11 LAB — CONVERTED CEMR LAB
Bacteria, UA: NEGATIVE
Bilirubin Urine: NEGATIVE
Crystals: NEGATIVE
Ketones, ur: NEGATIVE mg/dL
Nitrite: NEGATIVE
Specific Gravity, Urine: 1.02 (ref 1.000–1.03)
Squamous Epithelial / HPF: NEGATIVE /lpf
Urine Glucose: NEGATIVE mg/dL
Urobilinogen, UA: 0.2 (ref 0.0–1.0)
pH: 6 (ref 5.0–8.0)

## 2008-09-12 ENCOUNTER — Encounter: Payer: Self-pay | Admitting: Internal Medicine

## 2008-09-27 ENCOUNTER — Ambulatory Visit: Payer: Self-pay | Admitting: Internal Medicine

## 2008-10-11 ENCOUNTER — Ambulatory Visit: Payer: Self-pay | Admitting: Internal Medicine

## 2008-11-08 ENCOUNTER — Ambulatory Visit: Payer: Self-pay | Admitting: Internal Medicine

## 2008-11-13 ENCOUNTER — Ambulatory Visit: Payer: Self-pay | Admitting: Internal Medicine

## 2008-11-13 ENCOUNTER — Ambulatory Visit: Payer: Self-pay | Admitting: Cardiology

## 2008-11-13 DIAGNOSIS — R519 Headache, unspecified: Secondary | ICD-10-CM | POA: Insufficient documentation

## 2008-11-13 DIAGNOSIS — R51 Headache: Secondary | ICD-10-CM | POA: Insufficient documentation

## 2008-11-13 HISTORY — DX: Headache: R51

## 2008-12-06 ENCOUNTER — Ambulatory Visit: Payer: Self-pay | Admitting: Internal Medicine

## 2008-12-13 ENCOUNTER — Ambulatory Visit: Payer: Self-pay | Admitting: Internal Medicine

## 2009-01-03 ENCOUNTER — Ambulatory Visit: Payer: Self-pay | Admitting: Internal Medicine

## 2009-01-10 ENCOUNTER — Ambulatory Visit: Payer: Self-pay | Admitting: Internal Medicine

## 2009-01-17 ENCOUNTER — Ambulatory Visit: Payer: Self-pay | Admitting: Internal Medicine

## 2009-01-17 ENCOUNTER — Encounter: Admission: RE | Admit: 2009-01-17 | Discharge: 2009-01-17 | Payer: Self-pay | Admitting: Obstetrics and Gynecology

## 2009-02-07 ENCOUNTER — Ambulatory Visit: Payer: Self-pay | Admitting: Internal Medicine

## 2009-02-14 ENCOUNTER — Ambulatory Visit: Payer: Self-pay | Admitting: Internal Medicine

## 2009-02-21 ENCOUNTER — Ambulatory Visit: Payer: Self-pay | Admitting: Internal Medicine

## 2009-03-03 ENCOUNTER — Ambulatory Visit: Payer: Self-pay | Admitting: Obstetrics and Gynecology

## 2009-03-10 ENCOUNTER — Ambulatory Visit: Payer: Self-pay | Admitting: Endocrinology

## 2009-03-10 ENCOUNTER — Ambulatory Visit: Payer: Self-pay | Admitting: Internal Medicine

## 2009-03-10 DIAGNOSIS — E042 Nontoxic multinodular goiter: Secondary | ICD-10-CM | POA: Insufficient documentation

## 2009-03-10 DIAGNOSIS — R002 Palpitations: Secondary | ICD-10-CM | POA: Insufficient documentation

## 2009-03-10 HISTORY — DX: Palpitations: R00.2

## 2009-03-10 HISTORY — DX: Nontoxic multinodular goiter: E04.2

## 2009-03-28 ENCOUNTER — Ambulatory Visit: Payer: Self-pay | Admitting: Internal Medicine

## 2009-04-04 ENCOUNTER — Ambulatory Visit: Payer: Self-pay | Admitting: Internal Medicine

## 2009-04-07 ENCOUNTER — Encounter: Admission: RE | Admit: 2009-04-07 | Discharge: 2009-04-07 | Payer: Self-pay | Admitting: Endocrinology

## 2009-04-14 ENCOUNTER — Telehealth: Payer: Self-pay | Admitting: Internal Medicine

## 2009-05-16 ENCOUNTER — Ambulatory Visit: Payer: Self-pay | Admitting: Internal Medicine

## 2009-05-22 ENCOUNTER — Ambulatory Visit: Payer: Self-pay | Admitting: Internal Medicine

## 2009-05-29 ENCOUNTER — Ambulatory Visit: Payer: Self-pay | Admitting: Internal Medicine

## 2009-05-30 ENCOUNTER — Ambulatory Visit: Payer: Self-pay | Admitting: Internal Medicine

## 2009-06-06 ENCOUNTER — Ambulatory Visit: Payer: Self-pay | Admitting: Internal Medicine

## 2009-06-13 ENCOUNTER — Ambulatory Visit: Payer: Self-pay | Admitting: Internal Medicine

## 2009-06-19 ENCOUNTER — Ambulatory Visit: Payer: Self-pay | Admitting: Internal Medicine

## 2009-06-19 DIAGNOSIS — H6690 Otitis media, unspecified, unspecified ear: Secondary | ICD-10-CM | POA: Insufficient documentation

## 2009-06-19 DIAGNOSIS — L989 Disorder of the skin and subcutaneous tissue, unspecified: Secondary | ICD-10-CM | POA: Insufficient documentation

## 2009-07-04 ENCOUNTER — Ambulatory Visit: Payer: Self-pay | Admitting: Internal Medicine

## 2009-07-11 ENCOUNTER — Ambulatory Visit: Payer: Self-pay | Admitting: Internal Medicine

## 2009-07-25 ENCOUNTER — Ambulatory Visit: Payer: Self-pay | Admitting: Internal Medicine

## 2009-08-04 ENCOUNTER — Telehealth (INDEPENDENT_AMBULATORY_CARE_PROVIDER_SITE_OTHER): Payer: Self-pay | Admitting: *Deleted

## 2009-08-05 ENCOUNTER — Other Ambulatory Visit: Admission: RE | Admit: 2009-08-05 | Discharge: 2009-08-05 | Payer: Self-pay | Admitting: Obstetrics and Gynecology

## 2009-08-05 ENCOUNTER — Ambulatory Visit: Payer: Self-pay | Admitting: Obstetrics and Gynecology

## 2009-08-07 ENCOUNTER — Ambulatory Visit: Payer: Self-pay | Admitting: Internal Medicine

## 2009-08-29 ENCOUNTER — Ambulatory Visit: Payer: Self-pay | Admitting: Internal Medicine

## 2009-08-29 LAB — CONVERTED CEMR LAB
ALT: 57 units/L — ABNORMAL HIGH (ref 0–35)
AST: 43 units/L — ABNORMAL HIGH (ref 0–37)
Albumin: 4.2 g/dL (ref 3.5–5.2)
Alkaline Phosphatase: 100 units/L (ref 39–117)
BUN: 11 mg/dL (ref 6–23)
Basophils Absolute: 0.1 10*3/uL (ref 0.0–0.1)
Basophils Relative: 1.3 % (ref 0.0–3.0)
Bilirubin Urine: NEGATIVE
Bilirubin, Direct: 0.1 mg/dL (ref 0.0–0.3)
CO2: 29 meq/L (ref 19–32)
Calcium: 9.4 mg/dL (ref 8.4–10.5)
Chloride: 107 meq/L (ref 96–112)
Cholesterol: 160 mg/dL (ref 0–200)
Creatinine, Ser: 0.8 mg/dL (ref 0.4–1.2)
Eosinophils Absolute: 0.3 10*3/uL (ref 0.0–0.7)
Eosinophils Relative: 4 % (ref 0.0–5.0)
GFR calc non Af Amer: 79.59 mL/min (ref >60)
Glucose, Bld: 98 mg/dL (ref 70–99)
HCT: 38.4 % (ref 36.0–46.0)
HDL: 45.3 mg/dL (ref >39.00)
Hemoglobin, Urine: NEGATIVE
Hemoglobin: 12.7 g/dL (ref 12.0–15.0)
Ketones, ur: NEGATIVE mg/dL
LDL Cholesterol: 94 mg/dL (ref 0–99)
Leukocytes, UA: NEGATIVE
Lymphocytes Relative: 34.6 % (ref 12.0–46.0)
Lymphs Abs: 2.5 10*3/uL (ref 0.7–4.0)
MCHC: 33.1 g/dL (ref 30.0–36.0)
MCV: 84.8 fL (ref 78.0–100.0)
Monocytes Absolute: 0.5 10*3/uL (ref 0.1–1.0)
Monocytes Relative: 6.9 % (ref 3.0–12.0)
Neutro Abs: 3.7 10*3/uL (ref 1.4–7.7)
Neutrophils Relative %: 53.2 % (ref 43.0–77.0)
Nitrite: NEGATIVE
Platelets: 234 10*3/uL (ref 150.0–400.0)
Potassium: 3.9 meq/L (ref 3.5–5.1)
RBC: 4.53 M/uL (ref 3.87–5.11)
RDW: 13.3 % (ref 11.5–14.6)
Sodium: 143 meq/L (ref 135–145)
Specific Gravity, Urine: 1.01 (ref 1.000–1.030)
TSH: 0.74 microintl units/mL (ref 0.35–5.50)
Total Bilirubin: 0.7 mg/dL (ref 0.3–1.2)
Total CHOL/HDL Ratio: 4
Total Protein, Urine: NEGATIVE mg/dL
Total Protein: 7.3 g/dL (ref 6.0–8.3)
Triglycerides: 105 mg/dL (ref 0.0–149.0)
Urine Glucose: NEGATIVE mg/dL
Urobilinogen, UA: 0.2 (ref 0.0–1.0)
VLDL: 21 mg/dL (ref 0.0–40.0)
WBC: 7.1 10*3/uL (ref 4.5–10.5)
pH: 7 (ref 5.0–8.0)

## 2009-09-01 ENCOUNTER — Encounter: Payer: Self-pay | Admitting: Internal Medicine

## 2009-09-03 LAB — CONVERTED CEMR LAB
HCV Ab: NEGATIVE
Hep A IgM: NEGATIVE
Hep B C IgM: NEGATIVE
Hepatitis B Surface Ag: NEGATIVE

## 2009-09-15 ENCOUNTER — Ambulatory Visit: Payer: Self-pay | Admitting: Internal Medicine

## 2009-10-01 ENCOUNTER — Telehealth: Payer: Self-pay | Admitting: Internal Medicine

## 2009-10-10 ENCOUNTER — Ambulatory Visit: Payer: Self-pay | Admitting: Internal Medicine

## 2009-11-07 ENCOUNTER — Ambulatory Visit: Payer: Self-pay | Admitting: Internal Medicine

## 2009-11-26 ENCOUNTER — Ambulatory Visit: Payer: Self-pay | Admitting: Internal Medicine

## 2009-11-26 DIAGNOSIS — B373 Candidiasis of vulva and vagina: Secondary | ICD-10-CM | POA: Insufficient documentation

## 2009-11-26 DIAGNOSIS — B3731 Acute candidiasis of vulva and vagina: Secondary | ICD-10-CM | POA: Insufficient documentation

## 2009-12-20 ENCOUNTER — Ambulatory Visit: Payer: Self-pay | Admitting: Family Medicine

## 2010-03-03 ENCOUNTER — Ambulatory Visit: Payer: Self-pay | Admitting: Obstetrics and Gynecology

## 2010-03-09 ENCOUNTER — Encounter: Admission: RE | Admit: 2010-03-09 | Discharge: 2010-03-09 | Payer: Self-pay | Admitting: Obstetrics and Gynecology

## 2010-03-09 LAB — HM MAMMOGRAPHY

## 2010-03-10 ENCOUNTER — Ambulatory Visit: Payer: Self-pay | Admitting: Obstetrics and Gynecology

## 2010-07-23 ENCOUNTER — Telehealth: Payer: Self-pay | Admitting: Internal Medicine

## 2010-08-04 ENCOUNTER — Telehealth: Payer: Self-pay | Admitting: Internal Medicine

## 2010-08-12 ENCOUNTER — Ambulatory Visit: Payer: Self-pay | Admitting: Obstetrics and Gynecology

## 2010-08-12 ENCOUNTER — Other Ambulatory Visit: Admission: RE | Admit: 2010-08-12 | Discharge: 2010-08-12 | Payer: Self-pay | Admitting: Obstetrics and Gynecology

## 2010-08-12 ENCOUNTER — Encounter: Payer: Self-pay | Admitting: Internal Medicine

## 2010-08-24 ENCOUNTER — Ambulatory Visit: Payer: Self-pay | Admitting: Obstetrics and Gynecology

## 2010-08-31 ENCOUNTER — Telehealth: Payer: Self-pay | Admitting: Internal Medicine

## 2010-09-16 ENCOUNTER — Encounter: Payer: Self-pay | Admitting: Internal Medicine

## 2010-09-19 ENCOUNTER — Inpatient Hospital Stay: Payer: Self-pay | Admitting: Internal Medicine

## 2010-09-22 ENCOUNTER — Encounter: Payer: Self-pay | Admitting: Internal Medicine

## 2010-09-23 ENCOUNTER — Other Ambulatory Visit: Payer: Self-pay | Admitting: Internal Medicine

## 2010-09-23 ENCOUNTER — Ambulatory Visit
Admission: RE | Admit: 2010-09-23 | Discharge: 2010-09-23 | Payer: Self-pay | Source: Home / Self Care | Attending: Internal Medicine | Admitting: Internal Medicine

## 2010-09-23 DIAGNOSIS — N10 Acute tubulo-interstitial nephritis: Secondary | ICD-10-CM | POA: Insufficient documentation

## 2010-09-23 DIAGNOSIS — R197 Diarrhea, unspecified: Secondary | ICD-10-CM | POA: Insufficient documentation

## 2010-09-23 HISTORY — DX: Diarrhea, unspecified: R19.7

## 2010-09-23 LAB — CBC WITH DIFFERENTIAL/PLATELET
Basophils Absolute: 0.1 10*3/uL (ref 0.0–0.1)
Basophils Relative: 0.4 % (ref 0.0–3.0)
Eosinophils Absolute: 0.4 10*3/uL (ref 0.0–0.7)
Eosinophils Relative: 3.1 % (ref 0.0–5.0)
HCT: 34.8 % — ABNORMAL LOW (ref 36.0–46.0)
Hemoglobin: 11.8 g/dL — ABNORMAL LOW (ref 12.0–15.0)
Lymphocytes Relative: 23.4 % (ref 12.0–46.0)
Lymphs Abs: 2.8 10*3/uL (ref 0.7–4.0)
MCHC: 34 g/dL (ref 30.0–36.0)
MCV: 82 fl (ref 78.0–100.0)
Monocytes Absolute: 0.8 10*3/uL (ref 0.1–1.0)
Monocytes Relative: 7 % (ref 3.0–12.0)
Neutro Abs: 8 10*3/uL — ABNORMAL HIGH (ref 1.4–7.7)
Neutrophils Relative %: 66.1 % (ref 43.0–77.0)
Platelets: 387 10*3/uL (ref 150.0–400.0)
RBC: 4.24 Mil/uL (ref 3.87–5.11)
RDW: 14.9 % — ABNORMAL HIGH (ref 11.5–14.6)
WBC: 12.1 10*3/uL — ABNORMAL HIGH (ref 4.5–10.5)

## 2010-09-23 LAB — URINALYSIS, ROUTINE W REFLEX MICROSCOPIC
Bilirubin Urine: NEGATIVE
Ketones, ur: NEGATIVE
Leukocytes, UA: NEGATIVE
Nitrite: NEGATIVE
Specific Gravity, Urine: <=1.005 (ref 1.000–1.030)
Total Protein, Urine: NEGATIVE
Urine Glucose: NEGATIVE
Urobilinogen, UA: 0.2 (ref 0.0–1.0)
pH: 5.5 (ref 5.0–8.0)

## 2010-09-23 LAB — BASIC METABOLIC PANEL
BUN: 10 mg/dL (ref 6–23)
CO2: 28 mEq/L (ref 19–32)
Calcium: 9.4 mg/dL (ref 8.4–10.5)
Chloride: 102 mEq/L (ref 96–112)
Creatinine, Ser: 0.5 mg/dL (ref 0.4–1.2)
GFR: 136.35 mL/min (ref >60.00)
Glucose, Bld: 136 mg/dL — ABNORMAL HIGH (ref 70–99)
Potassium: 4.3 mEq/L (ref 3.5–5.1)
Sodium: 138 mEq/L (ref 135–145)

## 2010-09-24 ENCOUNTER — Encounter: Payer: Self-pay | Admitting: Internal Medicine

## 2010-09-24 LAB — HEMOGLOBIN A1C: Hgb A1c MFr Bld: 7.3 % — ABNORMAL HIGH (ref 4.6–6.5)

## 2010-09-25 ENCOUNTER — Ambulatory Visit: Admit: 2010-09-25 | Payer: Self-pay | Admitting: Gynecology

## 2010-10-11 ENCOUNTER — Encounter: Payer: Self-pay | Admitting: Internal Medicine

## 2010-10-18 LAB — CONVERTED CEMR LAB: Pap Smear: NORMAL

## 2010-10-19 ENCOUNTER — Ambulatory Visit: Admit: 2010-10-19 | Payer: Self-pay | Admitting: Internal Medicine

## 2010-10-20 NOTE — Assessment & Plan Note (Signed)
Summary: CONGESTION/ EAR ACHE/SORE THROAT/ NO FEVER/NWS  #   Vital Signs:  Patient profile:   55 year old female Height:      62 inches Weight:      186.50 pounds BMI:     34.23 O2 Sat:      96 % on Room air Temp:     97.6 degrees F oral Pulse rate:   77 / minute BP sitting:   142 / 90  (left arm) Cuff size:   regular  Vitals Entered ByZella Ball Ewing (November 26, 2009 2:39 PM)  O2 Flow:  Room air CC: congestion, ear ache, sore throat, yeast infection/RE   Primary Care Provider:  Oliver Barre  CC:  congestion, ear ache, sore throat, and yeast infection/RE.  History of Present Illness: here with 3 days acute onset mild to mod facial pain, pressure, fever and congestion with greenish d/c;  no ST; and Pt denies CP, sob, doe, wheezing, orthopnea, pnd, worsening LE edema, palps, dizziness or syncope   Problems Prior to Update: 1)  Sinusitis- Acute-nos  (ICD-461.9) 2)  Skin Lesion  (ICD-709.9) 3)  Otitis Media, Left  (ICD-382.9) 4)  Goiter, Multinodular  (ICD-241.1) 5)  Palpitations  (ICD-785.1) 6)  Headache  (ICD-784.0) 7)  Urinary Frequency  (ICD-788.41) 8)  Sinusitis- Acute-nos  (ICD-461.9) 9)  Preventive Health Care  (ICD-V70.0) 10)  Colonic Polyps, Hx of  (ICD-V12.72) 11)  Gastric Polyp  (ICD-211.1) 12)  Colonic Polyps  (ICD-211.3) 13)  Endometrial Polyp  (ICD-621.0) 14)  Dysmenorrhea  (ICD-625.3) 15)  Rosacea  (ICD-695.3) 16)  Endometriosis  (ICD-617.9) 17)  Uti's, Recurrent  (ICD-599.0) 18)  Hx of Ischemic Colitis, Hx of  (ICD-V12.79) 19)  Hypertension  (ICD-401.9) 20)  Hyperlipidemia  (ICD-272.4) 21)  Gerd  (ICD-530.81) 22)  Anxiety  (ICD-300.00) 23)  ? of Abnormal Chest Xray  (ICD-793.1) 24)  Allergic Rhinitis  (ICD-477.9)  Medications Prior to Update: 1)  Klor-Con M20 20 Meq  Tbcr (Potassium Chloride Crys Cr) .... Take 1 Two Times A Day 2)  Crestor 20 Mg  Tabs (Rosuvastatin Calcium) .... Take 1 By Mouth Once Daily 3)  Adult Aspirin Ec Low Strength 81 Mg  Tbec  (Aspirin) .... Take 1 By Mouth Qd 4)  Diovan Hct 320-25 Mg  Tabs (Valsartan-Hydrochlorothiazide) .... Take 1 By Mouth Once Daily 5)  Prilosec 20 Mg  Cpdr (Omeprazole) .... Take 1 Tablet By Mouth Two Times A Day 6)  Mucinex 600 Mg  Tb12 (Guaifenesin) .... Two Times A Day As Needed 7)  Afrin Nasal Spray 0.05 %  Soln (Oxymetazoline Hcl) .... As Needed 8)  Zyrtec Allergy 10 Mg  Tabs (Cetirizine Hcl) .... Take 1 By Mouth Qd 9)  Allergy Vaccine 1:10 Gh 10)  Multivitamins  Tabs (Multiple Vitamin) .... Take 1 By Mouth Once Daily 11)  Vitamin D 1000 Unit Tabs (Cholecalciferol) .... Take 1 By Mouth Once Daily 12)  Ciprodex 0.3-0.1 % Susp (Ciprofloxacin-Dexamethasone) .Marland Kitchen.. 1 - 2 Drops in Ear With Cotton, Twice Daily 13)  Zithromax Z-Pak 250 Mg Tabs (Azithromycin) .... Take As Directed  Current Medications (verified): 1)  Klor-Con M20 20 Meq  Tbcr (Potassium Chloride Crys Cr) .... Take 1 Two Times A Day 2)  Crestor 20 Mg  Tabs (Rosuvastatin Calcium) .... Take 1 By Mouth Once Daily 3)  Adult Aspirin Ec Low Strength 81 Mg  Tbec (Aspirin) .... Take 1 By Mouth Qd 4)  Diovan Hct 320-25 Mg  Tabs (Valsartan-Hydrochlorothiazide) .... Take 1 By Mouth  Once Daily 5)  Prilosec 20 Mg  Cpdr (Omeprazole) .... Take 1 Tablet By Mouth Two Times A Day 6)  Mucinex 600 Mg  Tb12 (Guaifenesin) .... Two Times A Day As Needed 7)  Afrin Nasal Spray 0.05 %  Soln (Oxymetazoline Hcl) .... As Needed 8)  Zyrtec Allergy 10 Mg  Tabs (Cetirizine Hcl) .... Take 1 By Mouth Qd 9)  Allergy Vaccine 1:10 Gh 10)  Multivitamins  Tabs (Multiple Vitamin) .... Take 1 By Mouth Once Daily 11)  Vitamin D 1000 Unit Tabs (Cholecalciferol) .... Take 1 By Mouth Once Daily 12)  Ciprodex 0.3-0.1 % Susp (Ciprofloxacin-Dexamethasone) .Marland Kitchen.. 1 - 2 Drops in Ear With Cotton, Twice Daily 13)  Doxycycline Hyclate 100 Mg Caps (Doxycycline Hyclate) .Marland Kitchen.. 1po Two Times A Day 14)  Fluconazole 100 Mg Tabs (Fluconazole) .Marland Kitchen.. 1 By Mouth Two Times A Day  Allergies  (verified): 1)  ! Lipitor 2)  ! Ceftin 3)  ! * Avelox 4)  ! Cipro 5)  ! * Latex 6)  ! Theressa Stamps  Past History:  Past Medical History: Last updated: 07/04/2008 Hx of ISCHEMIC COLITIS, HX OF (ICD-V12.79) HYPERTENSION (ICD-401.9) HYPERLIPIDEMIA (ICD-272.4) GERD (ICD-530.81) ANXIETY (ICD-300.00) ALLERGIC RHINITIS (ICD-477.9) hx of endometriosis glucose intolerance Colonic polyps, hx of - adenomatous hx of gastric polyp rosacea recurrent UTI's  Past Surgical History: Last updated: 11/30/2007 Dermoid Cyst; Rt Ovary (1994) Uterine Fibroids Removed (1996) Right Foot Neuroma (1988) Removed Skull Cyst (1973) Flex Sigmoidoscopy (11/16/1995) EKG (11/15/2006) Appendectomy (2005) Bladder Tack Cystoscopy (2007) Lt pectoral/subclavicular abscess drainage (2006)  Social History: Last updated: 07/04/2008 Patient never smoked.  Positive history of passive tobacco smoke exposure- as a child single no children 4 yrs college work - Artist - [employer] Alcohol use-yes  Risk Factors: Smoking Status: never (01/03/2008) Passive Smoke Exposure: yes (01/03/2008)  Review of Systems       all otherwise negative per pt -  except incidently with vaginal yeast infection it seems with typical d/c  Physical Exam  General:  alert and overweight-appearing.  , miild ill  Head:  normocephalic and atraumatic.   Eyes:  vision grossly intact, pupils equal, and pupils round.   Ears:  bilat tm's mild red, sinus tender bilat Nose:  nasal dischargemucosal pallor and airflow obstruction.   Mouth:  pharyngeal erythema and fair dentition.   Neck:  supple and cervical lymphadenopathy.   Lungs:  normal respiratory effort and normal breath sounds.   Heart:  normal rate and regular rhythm.   Abdomen:  soft, non-tender, and normal bowel sounds.   Genitalia:  deferrred Extremities:  no edema, no erythema    Impression & Recommendations:  Problem # 1:  SINUSITIS- ACUTE-NOS (ICD-461.9)  Her  updated medication list for this problem includes:    Mucinex 600 Mg Tb12 (Guaifenesin) .Marland Kitchen..Marland Kitchen Two times a day as needed    Afrin Nasal Spray 0.05 % Soln (Oxymetazoline hcl) .Marland Kitchen... As needed    Doxycycline Hyclate 100 Mg Caps (Doxycycline hyclate) .Marland Kitchen... 1po two times a day treat as above, f/u any worsening signs or symptoms   Problem # 2:  VAGINITIS, CANDIDAL (ICD-112.1)  Her updated medication list for this problem includes:    Fluconazole 100 Mg Tabs (Fluconazole) .Marland Kitchen... 1 by mouth two times a day treat as above, f/u any worsening signs or symptoms   Problem # 3:  HYPERTENSION (ICD-401.9)  Her updated medication list for this problem includes:    Diovan Hct 320-25 Mg Tabs (Valsartan-hydrochlorothiazide) .Marland Kitchen... Take 1 by mouth once  daily  BP today: 142/90 Prior BP: 120/80 (09/15/2009)  Labs Reviewed: K+: 3.9 (08/29/2009) Creat: : 0.8 (08/29/2009)   Chol: 160 (08/29/2009)   HDL: 45.30 (08/29/2009)   LDL: 94 (08/29/2009)   TG: 105.0 (08/29/2009) mild elev today, likely situational, ok to follow, continue same treatment   Complete Medication List: 1)  Klor-con M20 20 Meq Tbcr (Potassium chloride crys cr) .... Take 1 two times a day 2)  Crestor 20 Mg Tabs (Rosuvastatin calcium) .... Take 1 by mouth once daily 3)  Adult Aspirin Ec Low Strength 81 Mg Tbec (Aspirin) .... Take 1 by mouth qd 4)  Diovan Hct 320-25 Mg Tabs (Valsartan-hydrochlorothiazide) .... Take 1 by mouth once daily 5)  Prilosec 20 Mg Cpdr (Omeprazole) .... Take 1 tablet by mouth two times a day 6)  Mucinex 600 Mg Tb12 (Guaifenesin) .... Two times a day as needed 7)  Afrin Nasal Spray 0.05 % Soln (Oxymetazoline hcl) .... As needed 8)  Zyrtec Allergy 10 Mg Tabs (Cetirizine hcl) .... Take 1 by mouth qd 9)  Allergy Vaccine 1:10 Gh  10)  Multivitamins Tabs (Multiple vitamin) .... Take 1 by mouth once daily 11)  Vitamin D 1000 Unit Tabs (Cholecalciferol) .... Take 1 by mouth once daily 12)  Ciprodex 0.3-0.1 % Susp  (Ciprofloxacin-dexamethasone) .Marland Kitchen.. 1 - 2 drops in ear with cotton, twice daily 13)  Doxycycline Hyclate 100 Mg Caps (Doxycycline hyclate) .Marland Kitchen.. 1po two times a day 14)  Fluconazole 100 Mg Tabs (Fluconazole) .Marland Kitchen.. 1 by mouth two times a day  Patient Instructions: 1)  Please take all new medications as prescribed 2)  Continue all previous medications as before this visit  3)  You can also use Mucinex OTC or it's generic for congestion  4)  Please schedule a follow-up appointment as needed.  Prescriptions: FLUCONAZOLE 100 MG TABS (FLUCONAZOLE) 1 by mouth two times a day  #14 x 0   Entered and Authorized by:   Corwin Levins MD   Signed by:   Corwin Levins MD on 11/26/2009   Method used:   Print then Give to Patient   RxID:   [id number] DOXYCYCLINE HYCLATE 100 MG CAPS (DOXYCYCLINE HYCLATE) 1po two times a day  #20 x 0   Entered and Authorized by:   Corwin Levins MD   Signed by:   Corwin Levins MD on 11/26/2009   Method used:   Print then Give to Patient   RxID:   [id number]

## 2010-10-20 NOTE — Miscellaneous (Signed)
Summary: Injection Record/[facility]  Injection Record/[facility]   Imported By: Sherian Rein 02/10/2010 12:28:14  _____________________________________________________________________  External Attachment:    Type:   Image     Comment:   External Document

## 2010-10-20 NOTE — Progress Notes (Signed)
Summary: cough / congestion  Phone Note Call from Patient Call back at [phone]   Caller: Patient Call For: young Summary of Call: coughing [facility] [address] [facility] [phone] Initial call taken by: Rickard Patience,  October 01, 2009 11:28 AM  Follow-up for Phone Call        called, spoke with pt.  c/o sore throat, chest and head congestion, dry cough, chest tightness, SOB with activity, occasionally loosing voice, and a little bit of yellow mucus since the weekend.  denies fever and wheezing.  Taking a decongestant at night and mucinex-helping a little bit.  requesting med to be called in.  Will forward to CY-please advise. Thanks! Allergies-lipitor, ceftin, avelox, cipro CV [facility] Follow-up by: Gweneth Dimitri RN,  October 01, 2009 11:56 AM  Additional Follow-up for Phone Call Additional follow up Details #1::        Per CDY- give Zpak # 1 take as directed no refills.Reynaldo Minium CMA  October 01, 2009 1:28 PM      Appended Document: cough / congestion-Send RX Medications Added ZITHROMAX Z-PAK 250 MG TABS (AZITHROMYCIN) take as directed          Clinical Lists Changes  Medications: Added new medication of ZITHROMAX Z-PAK 250 MG TABS (AZITHROMYCIN) take as directed - Signed Rx of ZITHROMAX Z-PAK 250 MG TABS (AZITHROMYCIN) take as directed;  #1 pak x 0;  Signed;  Entered by: Reynaldo Minium CMA;  Authorized by: Waymon Budge MD;  Method used: [id number] [address] [place], [place]  [address], Ph: [phone] or [phone], Fax: [phone]    Prescriptions: ZITHROMAX Z-PAK 250 MG TABS (AZITHROMYCIN) take as directed  #1 pak x 0   Entered by:   Reynaldo Minium CMA   Authorized by:   Waymon Budge MD   Signed by:   Reynaldo Minium CMA on 10/02/2009   Method used:   [facility]        [id number] ([facility]ail[facility]       [address]       [place], [place]  [address]       Ph:  [phone] or [phone]       Fax: [phone]   RxID:   [id number]

## 2010-10-20 NOTE — Progress Notes (Signed)
Summary: ALLERGY  Phone Note Outgoing Call   Call placed by: T.Scott Call placed to: Patient Details for Reason: Last shot 11-26-09 Summary of Call: When Mrs.Brackeen came in for her last shot (11-26-09) she said she'd be back when she felt better. She's been having alot of upper respiratory problems. We have not seen or heard from her since. I had called her a couple of times before her March shot because it had been a few wks. then since she'd been in for a shot. FYI Initial call taken by: Dimas Millin,  July 23, 2010 10:21 AM  Follow-up for Phone Call        Noted Follow-up by: Waymon Budge MD,  July 23, 2010 12:51 PM

## 2010-10-20 NOTE — Progress Notes (Signed)
Summary: Rx refill req  Phone Note Refill Request Message from:  Patient on August 04, 2010 10:36 AM  Refills Requested: Medication #1:  DIOVAN HCT 320-25 MG  TABS TAKE 1 by mouth once daily   Dosage confirmed as above?Dosage Confirmed   Supply Requested: 3 months Pt is requesting 3 mth Rx to [facility] until CPX in Feb 2011   Method Requested: Electronic Initial call taken by: Margaret Pyle, CMA,  August 04, 2010 10:37 AM    Prescriptions: DIOVAN HCT 320-25 MG  TABS (VALSARTAN-HYDROCHLOROTHIAZIDE) TAKE 1 by mouth once daily  #90 x 0   Entered by:   Margaret Pyle, CMA   Authorized by:   Corwin Levins MD   Signed by:   Margaret Pyle, CMA on 08/04/2010   Method used:   Electronically to        [facility] (mail[facility]             , [place]         Ph: [phone]       Fax: [phone]   RxID:   [id number]

## 2010-10-20 NOTE — Assessment & Plan Note (Signed)
Summary: sinusitis   Vital Signs:  Patient profile:   55 year old female Height:      62 inches Weight:      185 pounds BMI:     33.96 O2 Sat:      96 % on Room air Temp:     97.6 degrees F oral Pulse rate:   98 / minute BP sitting:   136 / 84  (left arm) Cuff size:   regular  Vitals Entered By: Payton Spark CMA (December 20, 2009 12:47 PM)  O2 Flow:  Room air CC: Chest congestion, cough, and R ear pain x 2 days.    Primary Care Provider:  Corwin Levins MD  CC:  Chest congestion, cough, and and R ear pain x 2 days. Marland Kitchen  History of Present Illness: 55 yo WF presents for head and chest congestion that started 4 days ago.  She has R sided facial and ear pressure.  She had chills last night.  Has fatigue.  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  Has a sore throat.  Has some rhinorrhea.  She has a HA.  No documented fevers.  No GI upset.  Cough is dry.  Able to sleep.  She is taking Mucinex and saline.  Avoids decongestants due to elevated BPs.    Has hx of asthma and allergies.  She has not had SOB or wheezing.  Current Medications (verified): 1)  Klor-Con M20 20 Meq  Tbcr (Potassium Chloride Crys Cr) .... Take 1 Two Times A Day 2)  Crestor 20 Mg  Tabs (Rosuvastatin Calcium) .... Take 1 By Mouth Once Daily 3)  Adult Aspirin Ec Low Strength 81 Mg  Tbec (Aspirin) .... Take 1 By Mouth Qd 4)  Diovan Hct 320-25 Mg  Tabs (Valsartan-Hydrochlorothiazide) .... Take 1 By Mouth Once Daily 5)  Prilosec 20 Mg  Cpdr (Omeprazole) .... Take 1 Tablet By Mouth Two Times A Day 6)  Mucinex 600 Mg  Tb12 (Guaifenesin) .... Two Times A Day As Needed 7)  Afrin Nasal Spray 0.05 %  Soln (Oxymetazoline Hcl) .... As Needed 8)  Zyrtec Allergy 10 Mg  Tabs (Cetirizine Hcl) .... Take 1 By Mouth Qd 9)  Allergy Vaccine 1:10 Gh 10)  Multivitamins  Tabs (Multiple Vitamin) .... Take 1 By Mouth Once Daily 11)  Vitamin D 1000 Unit Tabs (Cholecalciferol) .... Take 1 By Mouth Once Daily 12)  Fluconazole 100 Mg Tabs (Fluconazole) .Marland Kitchen.. 1 By  Mouth Two Times A Day  Allergies (verified): 1)  ! Lipitor 2)  ! Ceftin 3)  ! * Avelox 4)  ! Cipro 5)  ! * Latex 6)  ! Theressa Stamps  Past History:  Past Medical History: Reviewed history from 07/04/2008 and no changes required. Hx of ISCHEMIC COLITIS, HX OF (ICD-V12.79) HYPERTENSION (ICD-401.9) HYPERLIPIDEMIA (ICD-272.4) GERD (ICD-530.81) ANXIETY (ICD-300.00) ALLERGIC RHINITIS (ICD-477.9) hx of endometriosis glucose intolerance Colonic polyps, hx of - adenomatous hx of gastric polyp rosacea recurrent UTI's  Past Surgical History: Reviewed history from 11/30/2007 and no changes required. Dermoid Cyst; Rt Ovary (1994) Uterine Fibroids Removed (1996) Right Foot Neuroma (1988) Removed Skull Cyst (4401) Flex Sigmoidoscopy (11/16/1995) EKG (11/15/2006) Appendectomy (2005) Bladder Tack Cystoscopy (2007) Lt pectoral/subclavicular abscess drainage (2006)  Family History: Reviewed history from 09/15/2009 and no changes required. Parents -COPD, allergies, asthma, heart disease Lung cancer- mother, grandfather uncle with crohn's disease no thyroid dz brother with severe PAD  Social History: Reviewed history from 07/04/2008 and no changes required. Patient never smoked.  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   Head:  normocephalic and atraumatic.  R sided maxillary sinus TTP Eyes:  conjunctiva clear Ears:  EACs patent; TMs translucent and gray with good cone of light and bony landmarks.  Nose:  clear rhinorrhea Mouth:  throat is mildly injected, clear postnasal drip Neck:  no masses.   Lungs:  Normal respiratory effort, chest expands symmetrically. Lungs are clear to auscultation, no crackles or wheezes. dry cough Heart:  Normal rate and regular  rhythm. S1 and S2 normal without gallop, murmur, click, rub or other extra sounds. Skin:  color normal.   Cervical Nodes:  shotty anterior cervical chain LA   Impression & Recommendations:  Problem # 1:  SINUSITIS- ACUTE-NOS (ICD-461.9) Secondary to underlying allergies. Treat with Amoxicillin x 10 days.  Stay on allergy meds.  Use Benzonate for cough.  F/U with Dr Jonny Ruiz if not improved in 10 days. The following medications were removed from the medication list:    Doxycycline Hyclate 100 Mg Caps (Doxycycline hyclate) .Marland Kitchen... 1po two times a day Her updated medication list for this problem includes:    Mucinex 600 Mg Tb12 (Guaifenesin) .Marland Kitchen..Marland Kitchen Two times a day as needed    Afrin Nasal Spray 0.05 % Soln (Oxymetazoline hcl) .Marland Kitchen... As needed    Amoxicillin 875 Mg Tabs (Amoxicillin) .Marland Kitchen... 1 tab by mouth q 12 hrs x 10 days    Benzonatate 200 Mg Caps (Benzonatate) .Marland Kitchen... 1 capsule by mouth three times a day as needed cough  Complete Medication List: 1)  Klor-con M20 20 Meq Tbcr (Potassium chloride crys cr) .... Take 1 two times a day 2)  Crestor 20 Mg Tabs (Rosuvastatin calcium) .... Take 1 by mouth once daily 3)  Adult Aspirin Ec Low Strength 81 Mg Tbec (Aspirin) .... Take 1 by mouth qd 4)  Diovan Hct 320-25 Mg Tabs (Valsartan-hydrochlorothiazide) .... Take 1 by mouth once daily 5)  Prilosec 20 Mg Cpdr (Omeprazole) .... Take 1 tablet by mouth two times a day 6)  Mucinex 600 Mg Tb12 (Guaifenesin) .... Two times a day as needed 7)  Afrin Nasal Spray 0.05 % Soln (Oxymetazoline hcl) .... As needed 8)  Zyrtec Allergy 10 Mg Tabs (Cetirizine hcl) .... Take 1 by mouth qd 9)  Allergy Vaccine 1:10 Gh  10)  Multivitamins Tabs (Multiple vitamin) .... Take 1 by mouth once daily 11)  Vitamin D 1000 Unit Tabs (Cholecalciferol) .... Take 1 by mouth once daily 12)  Fluconazole 100 Mg Tabs (Fluconazole) .Marland Kitchen.. 1 by mouth two times a day 13)  Amoxicillin 875 Mg Tabs (Amoxicillin) .Marland Kitchen.. 1 tab by mouth q 12 hrs x 10  days 14)  Benzonatate 200 Mg Caps (Benzonatate) .Marland Kitchen.. 1 capsule by mouth three times a day as needed cough  Patient Instructions: 1)  Take 10 days of high dose Amoxicillin for sinusitis. 2)  Use Benzonate as needed for cough. 3)  Stay on Zyrtec in the evenings for underlying allergies.   4)  Call Dr Jonny Ruiz if not improved in 10 days. Prescriptions: BENZONATATE 200 MG CAPS (BENZONATATE) 1 capsule by mouth three times a day as needed cough  #24 x 0   Entered and Authorized by:   Seymour Bars DO   Signed by:   Seymour Bars DO  on 12/20/2009   Method used:   [facility]        [id number]       [address]       [place], [place]  [address]       Ph: [phone] or [phone]       Fax: [phone]   RxID:   [id number] AMOXICILLIN 875 MG TABS (AMOXICILLIN) 1 tab by mouth q 12 hrs x 10 days  #20 x 0   Entered and Authorized by:   Seymour Bars DO   Signed by:   Seymour Bars DO on 12/20/2009   Method used:   [facility]        [id number]       [address]       [place], [place]  [address]       Ph: [phone] or [phone]       Fax: [phone]   RxID:   [id number]

## 2010-10-22 ENCOUNTER — Encounter (INDEPENDENT_AMBULATORY_CARE_PROVIDER_SITE_OTHER): Payer: Self-pay | Admitting: *Deleted

## 2010-10-22 ENCOUNTER — Other Ambulatory Visit: Payer: Self-pay | Admitting: Internal Medicine

## 2010-10-22 ENCOUNTER — Other Ambulatory Visit: Payer: 59

## 2010-10-22 DIAGNOSIS — Z0000 Encounter for general adult medical examination without abnormal findings: Secondary | ICD-10-CM

## 2010-10-22 DIAGNOSIS — R7309 Other abnormal glucose: Secondary | ICD-10-CM

## 2010-10-22 LAB — BASIC METABOLIC PANEL
CO2: 28 mEq/L (ref 19–32)
Calcium: 9.5 mg/dL (ref 8.4–10.5)
GFR: 86.71 mL/min (ref >60.00)
Potassium: 4.3 mEq/L (ref 3.5–5.1)
Sodium: 136 mEq/L (ref 135–145)

## 2010-10-22 LAB — HEPATIC FUNCTION PANEL
Albumin: 4.3 g/dL (ref 3.5–5.2)
Bilirubin, Direct: 0.1 mg/dL (ref 0.0–0.3)
Total Protein: 7.3 g/dL (ref 6.0–8.3)

## 2010-10-22 LAB — URINALYSIS
Ketones, ur: NEGATIVE
Specific Gravity, Urine: <=1.005 (ref 1.000–1.030)
Total Protein, Urine: NEGATIVE
Urine Glucose: NEGATIVE
pH: 6.5 (ref 5.0–8.0)

## 2010-10-22 LAB — CBC WITH DIFFERENTIAL/PLATELET
Basophils Relative: 0.9 % (ref 0.0–3.0)
Hemoglobin: 12.6 g/dL (ref 12.0–15.0)
Lymphocytes Relative: 34.2 % (ref 12.0–46.0)
MCHC: 35.3 g/dL (ref 30.0–36.0)
Monocytes Relative: 6.8 % (ref 3.0–12.0)
Neutro Abs: 4.3 10*3/uL (ref 1.4–7.7)
RBC: 4.28 Mil/uL (ref 3.87–5.11)

## 2010-10-22 LAB — LIPID PANEL
Cholesterol: 124 mg/dL (ref 0–200)
Triglycerides: 149 mg/dL (ref 0.0–149.0)

## 2010-10-22 NOTE — Letter (Signed)
[facility]   [facility]   Imported By: Lennie Odor 09/29/2010 10:51:41  _____________________________________________________________________  External Attachment:    Type:   Image     Comment:   External Document

## 2010-10-22 NOTE — Progress Notes (Signed)
  Phone Note Call from Patient Call back at Home Phone [phone]   Caller: Patient--[phone] @work  Call For: Corwin Levins MD Summary of Call: Pt states she has 4 pills left of Diovan. [facility] informed pt they did not receive fax 11/15 for request for refill.[phone] [address]/[place]). Initial call taken by: Verdell Face,  August 31, 2010 10:14 AM    Prescriptions: DIOVAN HCT 320-25 MG  TABS (VALSARTAN-HYDROCHLOROTHIAZIDE) TAKE 1 by mouth once daily  #90 x 1   Entered by:   Zella Ball Ewing CMA (AAMA)   Authorized by:   Corwin Levins MD   Signed by:   Scharlene Gloss CMA (AAMA) on 08/31/2010   Method used:   [facility]ed to ...       [id number]       [address]       [place], [place]  [address]       Ph: [phone] or [phone]       Fax: [phone]   RxID:   [id number]

## 2010-10-22 NOTE — Letter (Signed)
Summary: Patient Discharge Instruction / [facility]  Patient Discharge Instruction / [facility]   Imported By: Lennie Odor 09/29/2010 16:36:24  _____________________________________________________________________  External Attachment:    Type:   Image     Comment:   External Document

## 2010-10-22 NOTE — Assessment & Plan Note (Signed)
Summary: post hosp/cd   Vital Signs:  Patient profile:   55 year old female Height:      61.5 inches Weight:      173.50 pounds BMI:     32.37 O2 Sat:      98 % on Room air Temp:     97.8 degrees F oral Pulse rate:   80 / minute BP sitting:   140 / 90  (left arm) Cuff size:   regular  Vitals Entered By: Zella Ball Ewing CMA Duncan Dull) (September 23, 2010 2:18 PM)  O2 Flow:  Room air CC: Post Hospital/RE   Primary Care Provider:  Corwin Levins MD  CC:  Post Hospital/RE.  History of Present Illness: here with rather involved recent hx;  pt relates how she has had recurrent vaginal yeast infections over the past 4 months with fluconazole oral and vag creams;  had an intiial UTI in november 2011 tx with macrobid (? etiology, not clear if cx done),  improved then seemed to recur again dec 29 with cloudy urine, but GYN was out of the office, urine cx was obtained however (? results) and pt tx and took 2 days bactrim but seemed to get worse with fever, chills, sweats and n/v; presented to ER at [facility] and admiitted  - tx for left pyelonephritis, dehydration and prerenal insufficiency;  tx with IVF and IV antibx (not sure of name); hosp course complicated by further diarrhea from the contrast used for CT abd (no results available, but neg per pt for abscess ,obstruction, stone);  dishcarged jan 3, but despite one dose of spectracef given at d/c  has had subjective fever, chills, nausea and loose/watery stools (better with immodium, no blood) last PM and this AM;  fortunately no rash, or increased abd pain or flank pain.  Denies further vomiting since last PM and tool po ok today, or dizziness/orthostasis today.  No hx of c diff.   Pt denies CP, worsening sob, doe, wheezing, orthopnea, pnd, worsening LE edema, palps, dizziness or syncope  Pt denies new neuro symptoms such as headache, facial or extremity weakness Pt denies polydipsia, polyuria.    Problems Prior to Update: 1)  Diarrhea, Acute   (ICD-787.91) 2)  Pyelonephritis, Acute  (ICD-590.10) 3)  Vaginitis, Candidal  (ICD-112.1) 4)  Sinusitis- Acute-nos  (ICD-461.9) 5)  Skin Lesion  (ICD-709.9) 6)  Otitis Media, Left  (ICD-382.9) 7)  Goiter, Multinodular  (ICD-241.1) 8)  Palpitations  (ICD-785.1) 9)  Headache  (ICD-784.0) 10)  Urinary Frequency  (ICD-788.41) 11)  Sinusitis- Acute-nos  (ICD-461.9) 12)  Preventive Health Care  (ICD-V70.0) 13)  Colonic Polyps, Hx of  (ICD-V12.72) 14)  Gastric Polyp  (ICD-211.1) 15)  Colonic Polyps  (ICD-211.3) 16)  Endometrial Polyp  (ICD-621.0) 17)  Dysmenorrhea  (ICD-625.3) 18)  Rosacea  (ICD-695.3) 19)  Endometriosis  (ICD-617.9) 20)  Uti's, Recurrent  (ICD-599.0) 21)  Hx of Ischemic Colitis, Hx of  (ICD-V12.79) 22)  Hypertension  (ICD-401.9) 23)  Hyperlipidemia  (ICD-272.4) 24)  Gerd  (ICD-530.81) 25)  Anxiety  (ICD-300.00) 26)  ? of Abnormal Chest Xray  (ICD-793.1) 27)  Allergic Rhinitis  (ICD-477.9)  Medications Prior to Update: 1)  Klor-Con M20 20 Meq  Tbcr (Potassium Chloride Crys Cr) .... Take 1 Two Times A Day 2)  Crestor 20 Mg  Tabs (Rosuvastatin Calcium) .... Take 1 By Mouth Once Daily 3)  Adult Aspirin Ec Low Strength 81 Mg  Tbec (Aspirin) .... Take 1 By Mouth Qd 4)  Diovan Hct 320-25 Mg  Tabs (Valsartan-Hydrochlorothiazide) .... Take 1 By Mouth Once Daily 5)  Prilosec 20 Mg  Cpdr (Omeprazole) .... Take 1 Tablet By Mouth Two Times A Day 6)  Mucinex 600 Mg  Tb12 (Guaifenesin) .... Two Times A Day As Needed 7)  Afrin Nasal Spray 0.05 %  Soln (Oxymetazoline Hcl) .... As Needed 8)  Zyrtec Allergy 10 Mg  Tabs (Cetirizine Hcl) .... Take 1 By Mouth Qd 9)  Allergy Vaccine 1:10 Gh 10)  Multivitamins  Tabs (Multiple Vitamin) .... Take 1 By Mouth Once Daily 11)  Vitamin D 1000 Unit Tabs (Cholecalciferol) .... Take 1 By Mouth Once Daily 12)  Fluconazole 100 Mg Tabs (Fluconazole) .Marland Kitchen.. 1 By Mouth Two Times A Day 13)  Amoxicillin 875 Mg Tabs (Amoxicillin) .Marland Kitchen.. 1 Tab By Mouth Q 12 Hrs  X 10 Days 14)  Benzonatate 200 Mg Caps (Benzonatate) .Marland Kitchen.. 1 Capsule By Mouth Three Times A Day As Needed Cough  Current Medications (verified): 1)  Klor-Con M20 20 Meq  Tbcr (Potassium Chloride Crys Cr) .... Take 1 Two Times A Day 2)  Crestor 20 Mg  Tabs (Rosuvastatin Calcium) .... Take 1 By Mouth Once Daily 3)  Adult Aspirin Ec Low Strength 81 Mg  Tbec (Aspirin) .... Take 1 By Mouth Qd 4)  Diovan Hct 320-25 Mg  Tabs (Valsartan-Hydrochlorothiazide) .... Take 1 By Mouth Once Daily 5)  Prilosec 20 Mg  Cpdr (Omeprazole) .... Take 1 Tablet By Mouth Two Times A Day 6)  Mucinex 600 Mg  Tb12 (Guaifenesin) .... Two Times A Day As Needed 7)  Afrin Nasal Spray 0.05 %  Soln (Oxymetazoline Hcl) .... As Needed 8)  Zyrtec Allergy 10 Mg  Tabs (Cetirizine Hcl) .... Take 1 By Mouth Qd 9)  Allergy Vaccine 1:10 Gh 10)  Multivitamins  Tabs (Multiple Vitamin) .... Take 1 By Mouth Once Daily 11)  Vitamin D 1000 Unit Tabs (Cholecalciferol) .... Take 1 By Mouth Once Daily 12)  Cefditoren Pivoxil 400 Mg Tabs (Cefditoren Pivoxil) .Marland Kitchen.. 1 By Mouth Two Times A Day For 6 Days 13)  Promethazine Hcl 25 Mg Tabs (Promethazine Hcl) .Marland Kitchen.. 1po Q 6 Hrs As Needed Nausea  Allergies (verified): 1)  ! Lipitor 2)  ! Ceftin 3)  ! * Avelox 4)  ! Cipro 5)  ! * Latex 6)  ! Theressa Stamps  Past History:  Past Medical History: Last updated: 07/04/2008 Hx of ISCHEMIC COLITIS, HX OF (ICD-V12.79) HYPERTENSION (ICD-401.9) HYPERLIPIDEMIA (ICD-272.4) GERD (ICD-530.81) ANXIETY (ICD-300.00) ALLERGIC RHINITIS (ICD-477.9) hx of endometriosis glucose intolerance Colonic polyps, hx of - adenomatous hx of gastric polyp rosacea recurrent UTI's  Past Surgical History: Last updated: 11/30/2007 Dermoid Cyst; Rt Ovary (1994) Uterine Fibroids Removed (1996) Right Foot Neuroma (1988) Removed Skull Cyst (1973) Flex Sigmoidoscopy (11/16/1995) EKG (11/15/2006) Appendectomy (2005) Bladder Tack Cystoscopy (2007) Lt pectoral/subclavicular abscess  drainage (2006)  Social History: Last updated: 07/04/2008 Patient never smoked.  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   Eyes:  vision grossly intact, pupils equal, and pupils round.   Ears:  R ear normal and L ear normal.   Nose:  no external deformity  and no nasal discharge.   Mouth:  no gingival abnormalities and pharynx pink and moist.   Neck:  supple and no masses.   Lungs:  normal respiratory effort and normal breath sounds.   Heart:  normal rate and regular rhythm.   Abdomen:  soft and normal bowel sounds.  with mild tender low mid abd , without guarding or rebound Msk:  no joint tenderness and no joint swelling.   Extremities:  no edema, no erythema    Impression & Recommendations:  Problem # 1:  PYELONEPHRITIS, ACUTE (ICD-590.10)  improved overall except last night (first night home post d/c) had subjective fever, chills, ongoing nausea and cont'd loose/watery stools - will advise cont the spectracef for now, but check f/u urine studies today,  phenergan for noausea ; consider change to levoquin if feels worse in the next 1-2 days;  also check cbc, bmp today  Orders: TLB-BMP (Basic Metabolic Panel-BMET) (80048-METABOL) TLB-CBC Platelet - w/Differential (85025-CBCD) TLB-Udip w/ Micro (81001-URINE) T-Culture, Urine ([id number])  Problem # 2:  DIARRHEA, ACUTE (ICD-787.91)  with hosp exposure will check c diff by PCR,  but may be related to mult antibx recently;  for align OTC probiotic, and immodium prn  Orders: T-C diff by PCR (96295)  Problem # 3:  HYPERTENSION (ICD-401.9)  Her updated  medication list for this problem includes:    Diovan Hct 320-25 Mg Tabs (Valsartan-hydrochlorothiazide) .Marland Kitchen... Take 1 by mouth once daily  BP today: 140/90 Prior BP: 136/84 (12/20/2009)  Labs Reviewed: K+: 3.9 (08/29/2009) Creat: : 0.8 (08/29/2009)   Chol: 160 (08/29/2009)   HDL: 45.30 (08/29/2009)   LDL: 94 (08/29/2009)   TG: 105.0 (08/29/2009) stable overall by hx and exam, ok to continue meds/tx as is   Problem # 4:  VAGINITIS, CANDIDAL (ICD-112.1)  The following medications were removed from the medication list:    Fluconazole 100 Mg Tabs (Fluconazole) .Marland Kitchen... 1 by mouth two times a day asympt now per pt;  Continue all previous medications as before this visit , pt to call if further med needed or f/u GYN  Complete Medication List: 1)  Klor-con M20 20 Meq Tbcr (Potassium chloride crys cr) .... Take 1 two times a day 2)  Crestor 20 Mg Tabs (Rosuvastatin calcium) .... Take 1 by mouth once daily 3)  Adult Aspirin Ec Low Strength 81 Mg Tbec (Aspirin) .... Take 1 by mouth qd 4)  Diovan Hct 320-25 Mg Tabs (Valsartan-hydrochlorothiazide) .... Take 1 by mouth once daily 5)  Prilosec 20 Mg Cpdr (Omeprazole) .... Take 1 tablet by mouth two times a day 6)  Mucinex 600 Mg Tb12 (Guaifenesin) .... Two times a day as needed 7)  Afrin Nasal Spray 0.05 % Soln (Oxymetazoline hcl) .... As needed 8)  Zyrtec Allergy 10 Mg Tabs (Cetirizine hcl) .... Take 1 by mouth qd 9)  Allergy Vaccine 1:10 Gh  10)  Multivitamins Tabs (Multiple vitamin) .... Take 1 by mouth once daily 11)  Vitamin D 1000 Unit Tabs (Cholecalciferol) .... Take 1 by mouth once daily 12)  Cefditoren Pivoxil 400 Mg Tabs (Cefditoren pivoxil) .Marland Kitchen.. 1 by mouth two times a day for 6 days 13)  Promethazine Hcl 25 Mg Tabs (Promethazine hcl) .Marland Kitchen.. 1po q 6 hrs as needed nausea  Patient Instructions: 1)  Please go to the Lab in the basement for your blood and/or urine tests today , and stool test 2)  Please call the number on the [facility] Card for  results of your testing  3)  Please take all new medications as prescribed - the phenergan for nausea 4)  Continue all previous medications as before this visit  5)  you can also take the probiotic OTC, and immodium as needed  6)  If you feel obviously worse in the next few days, we can consider change to different antibiotic, although I hope to not do this 7)  Please schedule a follow-up appointment in 1 month as planned for your yearly exam 8)  Please call if you need the fluconazole for yeast infectoin Prescriptions: PROMETHAZINE HCL 25 MG TABS (PROMETHAZINE HCL) 1po q 6 hrs as needed nausea  #40 x 1   Entered and Authorized by:   Corwin Levins MD   Signed by:   Corwin Levins MD on 09/23/2010   Method used:   Print then Give to Patient   RxID:   [id number]    Orders Added: 1)  T-C diff by PCR [81755] 2)  TLB-BMP (Basic Metabolic Panel-BMET) [80048-METABOL] 3)  TLB-CBC Platelet - w/Differential [85025-CBCD] 4)  TLB-Udip w/ Micro [81001-URINE] 5)  T-Culture, Urine [[id number]] 6)  Est. Patient Level IV [84132]

## 2010-10-26 ENCOUNTER — Encounter (INDEPENDENT_AMBULATORY_CARE_PROVIDER_SITE_OTHER): Payer: 59 | Admitting: Internal Medicine

## 2010-10-26 ENCOUNTER — Encounter: Payer: Self-pay | Admitting: Internal Medicine

## 2010-10-26 DIAGNOSIS — E119 Type 2 diabetes mellitus without complications: Secondary | ICD-10-CM | POA: Insufficient documentation

## 2010-10-26 DIAGNOSIS — Z0000 Encounter for general adult medical examination without abnormal findings: Secondary | ICD-10-CM

## 2010-10-28 ENCOUNTER — Other Ambulatory Visit: Payer: Self-pay | Admitting: Internal Medicine

## 2010-10-28 DIAGNOSIS — E042 Nontoxic multinodular goiter: Secondary | ICD-10-CM

## 2010-11-04 ENCOUNTER — Ambulatory Visit
Admission: RE | Admit: 2010-11-04 | Discharge: 2010-11-04 | Disposition: A | Discharge status: Home / Self Care | Payer: 59 | Source: Ambulatory Visit | Attending: Internal Medicine | Admitting: Internal Medicine

## 2010-11-04 ENCOUNTER — Encounter: Payer: Self-pay | Admitting: Internal Medicine

## 2010-11-04 DIAGNOSIS — E042 Nontoxic multinodular goiter: Secondary | ICD-10-CM

## 2010-11-04 DIAGNOSIS — E041 Nontoxic single thyroid nodule: Secondary | ICD-10-CM | POA: Insufficient documentation

## 2010-11-04 HISTORY — DX: Nontoxic single thyroid nodule: E04.1

## 2010-11-05 ENCOUNTER — Other Ambulatory Visit: Payer: Self-pay | Admitting: Internal Medicine

## 2010-11-05 ENCOUNTER — Other Ambulatory Visit: Payer: Self-pay | Admitting: Emergency Medicine

## 2010-11-05 DIAGNOSIS — E041 Nontoxic single thyroid nodule: Secondary | ICD-10-CM

## 2010-11-05 NOTE — Assessment & Plan Note (Signed)
Summary: PHYSICAL--STC   Vital Signs:  Patient profile:   55 year old female Height:      61.5 inches Weight:      174 pounds BMI:     32.46 O2 Sat:      95 % on Room air Temp:     98.0 degrees F oral Pulse rate:   78 / minute BP sitting:   98 / 60  (left arm) Cuff size:   regular  Vitals Entered By: Margaret Pyle, CMA (October 26, 2010 1:30 PM)  O2 Flow:  Room air  CC: Physical   Primary Care Provider:  Corwin Levins MD  CC:  Physical.  History of Present Illness: here for wellness, and f/u - overall doing ok;  Pt denies CP, worsening sob, doe, wheezing, orthopnea, pnd, worsening LE edema, palps, dizziness or syncope  Pt denies new neuro symptoms such as headache, facial or extremity weakness  Pt denies polydipsia, polyuria, or low sugar symptoms such as shakiness improved with eating.  Overall good compliance with meds, trying to follow low chol, DM diet.   Never took the metformin, joined the [facility] and trying to bemore acitve and lost several lbs - wt down to 174 down from usual about 184;  Overall good compliance with meds, and good tolerability.  No fever, wt loss, night sweats, loss of appetite or other constitutional symptoms  Denies worsening depressive symptoms, suicidal ideation, or panic.   Pt states good ability with ADL's, low fall risk, home safety reviewed and adequate, no significant change in hearing or vision, trying to follow lower chol diet, and occasionally active only with regular excercise.   did give up on her allergy shot with dr young due to not feeling much and cost of transportation.  Also with recurrent yeast infections for over the past yr - tx per GYN - dr Oletha Blend  Preventive Screening-Counseling & Management      Drug Use:  no.    Problems Prior to Update: 1)  Diarrhea, Acute  (ICD-787.91) 2)  Pyelonephritis, Acute  (ICD-590.10) 3)  Vaginitis, Candidal  (ICD-112.1) 4)  Sinusitis- Acute-nos  (ICD-461.9) 5)  Skin Lesion  (ICD-709.9) 6)   Otitis Media, Left  (ICD-382.9) 7)  Goiter, Multinodular  (ICD-241.1) 8)  Palpitations  (ICD-785.1) 9)  Headache  (ICD-784.0) 10)  Urinary Frequency  (ICD-788.41) 11)  Sinusitis- Acute-nos  (ICD-461.9) 12)  Preventive Health Care  (ICD-V70.0) 13)  Colonic Polyps, Hx of  (ICD-V12.72) 14)  Gastric Polyp  (ICD-211.1) 15)  Colonic Polyps  (ICD-211.3) 16)  Endometrial Polyp  (ICD-621.0) 17)  Dysmenorrhea  (ICD-625.3) 18)  Rosacea  (ICD-695.3) 19)  Endometriosis  (ICD-617.9) 20)  Uti's, Recurrent  (ICD-599.0) 21)  Hx of Ischemic Colitis, Hx of  (ICD-V12.79) 22)  Hypertension  (ICD-401.9) 23)  Hyperlipidemia  (ICD-272.4) 24)  Gerd  (ICD-530.81) 25)  Anxiety  (ICD-300.00) 26)  ? of Abnormal Chest Xray  (ICD-793.1) 27)  Allergic Rhinitis  (ICD-477.9)  Medications Prior to Update: 1)  Klor-Con M20 20 Meq  Tbcr (Potassium Chloride Crys Cr) .... Take 1 Two Times A Day 2)  Crestor 20 Mg  Tabs (Rosuvastatin Calcium) .... Take 1 By Mouth Once Daily 3)  Adult Aspirin Ec Low Strength 81 Mg  Tbec (Aspirin) .... Take 1 By Mouth Qd 4)  Diovan Hct 320-25 Mg  Tabs (Valsartan-Hydrochlorothiazide) .... Take 1 By Mouth Once Daily 5)  Prilosec 20 Mg  Cpdr (Omeprazole) .... Take 1 Tablet By Mouth Two Times A  Day 6)  Mucinex 600 Mg  Tb12 (Guaifenesin) .... Two Times A Day As Needed 7)  Afrin Nasal Spray 0.05 %  Soln (Oxymetazoline Hcl) .... As Needed 8)  Zyrtec Allergy 10 Mg  Tabs (Cetirizine Hcl) .... Take 1 By Mouth Qd 9)  Allergy Vaccine 1:10 Gh 10)  Multivitamins  Tabs (Multiple Vitamin) .... Take 1 By Mouth Once Daily 11)  Vitamin D 1000 Unit Tabs (Cholecalciferol) .... Take 1 By Mouth Once Daily 12)  Cefditoren Pivoxil 400 Mg Tabs (Cefditoren Pivoxil) .Marland Kitchen.. 1 By Mouth Two Times A Day For 6 Days 13)  Promethazine Hcl 25 Mg Tabs (Promethazine Hcl) .Marland Kitchen.. 1po Q 6 Hrs As Needed Nausea 14)  Metformin Hcl 500 Mg Tabs (Metformin Hcl) .Marland Kitchen.. 1po Once Daily  Current Medications (verified): 1)  Klor-Con M20 20  Meq  Tbcr (Potassium Chloride Crys Cr) .... Take 1 Two Times A Day 2)  Crestor 20 Mg  Tabs (Rosuvastatin Calcium) .... Take 1 By Mouth Once Daily 3)  Adult Aspirin Ec Low Strength 81 Mg  Tbec (Aspirin) .... Take 1 By Mouth Qd 4)  Diovan Hct 320-25 Mg  Tabs (Valsartan-Hydrochlorothiazide) .... Take 1 By Mouth Once Daily 5)  Prilosec 20 Mg  Cpdr (Omeprazole) .... Take 1 Tablet By Mouth Two Times A Day 6)  Mucinex 600 Mg  Tb12 (Guaifenesin) .... Two Times A Day As Needed 7)  Afrin Nasal Spray 0.05 %  Soln (Oxymetazoline Hcl) .... As Needed 8)  Zyrtec Allergy 10 Mg  Tabs (Cetirizine Hcl) .... Take 1 By Mouth Qd 9)  Allergy Vaccine 1:10 Gh 10)  Multivitamins  Tabs (Multiple Vitamin) .... Take 1 By Mouth Once Daily 11)  Vitamin D 1000 Unit Tabs (Cholecalciferol) .... Take 1 By Mouth Once Daily  Allergies (verified): 1)  ! Lipitor 2)  ! Ceftin 3)  ! * Avelox 4)  ! Cipro 5)  ! * Latex 6)  ! * Crab  Past History:  Past Surgical History: Last updated: 11/30/2007 Dermoid Cyst; Rt Ovary (1994) Uterine Fibroids Removed (1996) Right Foot Neuroma (1988) Removed Skull Cyst (1973) Flex Sigmoidoscopy (11/16/1995) EKG (11/15/2006) Appendectomy (2005) Bladder Tack Cystoscopy (2007) Lt pectoral/subclavicular abscess drainage (2006)  Family History: Last updated: 09/15/2009 Parents -COPD, allergies, asthma, heart disease Lung cancer- mother, grandfather uncle with crohn's disease no thyroid dz brother with severe PAD  Social History: Last updated: 10/26/2010 Patient never smoked.  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         all otherwise negative per pt -  Physical Exam  General:  alert and overweight-appearing. Head:  normocephalic and atraumatic.   Eyes:  vision grossly intact, pupils equal, and pupils round.   Ears:  R ear normal and L ear normal.   Nose:  no external deformity and no nasal discharge.   Mouth:  no gingival abnormalities and pharynx pink and moist.   Neck:  supple and no masses.   Lungs:  normal respiratory effort and normal breath sounds.   Heart:  normal rate and regular rhythm.   Abdomen:  soft, non-tender, and normal bowel sounds.   Msk:  no joint tenderness and no joint swelling.   Extremities:  no edema, no erythema  Neurologic:  strength normal in all extremities and gait normal.   Skin:  color normal and no rashes.   Psych:  not anxious appearing and not depressed appearing.     Impression & Recommendations:  Problem # 1:  Preventive Health Care (ICD-V70.0) Overall doing well, age appropriate education and counseling updated, referral for preventive services and immunizations addressed, dietary counseling and  smoking status adressed , most recent labs reviewed I have personally reviewed and have noted 1.The patient's medical and social history 2.Their use of alcohol, tobacco or illicit drugs 3.Their current medications and supplements 4. Functional ability including ADL's, fall risk, home safety risk, hearing & visual impairment  5.Diet and physical activities 6.Evidence for depression or mood disorders The patients weight, height, BMI  have been recorded in the chart I have made referrals, counseling and provided education to the patient based review of the above   Problem # 2:  GOITER, MULTINODULAR (ICD-241.1)  due for f/u  - will order repeat thyroid u/s  Orders: Radiology Referral (Radiology)  Problem # 3:  DIABETES MELLITUS, TYPE II (ICD-250.00)  The following medications were removed from the medication list:    Metformin Hcl 500 Mg Tabs (Metformin hcl) .Marland Kitchen... 1po once daily Her updated medication list for this problem includes:    Adult Aspirin Ec Low Strength 81 Mg Tbec (Aspirin) .Marland Kitchen... Take 1 by mouth qd    Diovan Hct 320-25 Mg Tabs (Valsartan-hydrochlorothiazide) .Marland Kitchen... Take 1 by mouth once daily  Labs Reviewed: Creat: 0.7 (10/22/2010)    Reviewed HgBA1c results: 6.6 (10/22/2010)  7.3 (09/23/2010) stable overall by hx and exam, ok to continue meds/tx as is  - no meds needed at this time - Pt to cont DM diet, excercise, wt control efforts  Complete Medication List: 1)  Klor-con M20 20 Meq Tbcr (Potassium chloride crys cr) .... Take 1 two times a day 2)  Crestor 20 Mg Tabs (Rosuvastatin calcium) .... Take 1 by mouth once daily 3)  Adult Aspirin Ec Low Strength 81 Mg Tbec (Aspirin) .... Take 1 by mouth qd 4)  Diovan Hct 320-25 Mg Tabs (Valsartan-hydrochlorothiazide) .... Take 1 by mouth once daily 5)  Prilosec 20 Mg Cpdr (Omeprazole) .... Take 1 tablet by mouth two times a day 6)  Mucinex 600 Mg Tb12 (Guaifenesin) .... Two times a day as needed 7)  Afrin Nasal Spray 0.05 % Soln  (Oxymetazoline hcl) .... As needed 8)  Zyrtec Allergy 10 Mg Tabs (Cetirizine hcl) .... Take 1 by mouth qd 9)  Allergy Vaccine 1:10 Gh  10)  Multivitamins Tabs (Multiple vitamin) .... Take 1 by mouth once daily 11)  Vitamin D 1000 Unit Tabs (Cholecalciferol) .... Take 1 by mouth once daily  Patient Instructions: 1)  You will be contacted about the referral(s) to: thyroid u/s 2)  Continue  all previous medications as before this visit 3)  Please schedule a follow-up appointment in 6 months with: 4)  BMP prior to visit, ICD-9: 250.02 5)  Lipid Panel prior to visit, ICD-9: 6)  HbgA1C prior to visit, ICD-9: Prescriptions: DIOVAN HCT 320-25 MG  TABS (VALSARTAN-HYDROCHLOROTHIAZIDE) TAKE 1 by mouth once daily  #90 x 3   Entered and Authorized by:   Corwin Levins MD   Signed by:   Corwin Levins MD on 10/26/2010   Method used:   Print then Give to Patient   RxID:   [id number] CRESTOR 20 MG  TABS (ROSUVASTATIN CALCIUM) TAKE 1 by mouth once daily  #90 x 3   Entered and Authorized by:   Corwin Levins MD   Signed by:   Corwin Levins MD on 10/26/2010   Method used:   Print then Give to Patient   RxID:   [id number] KLOR-CON M20 20 MEQ  TBCR (POTASSIUM CHLORIDE CRYS CR) TAKE 1 two times a day  #180 x 3   Entered and Authorized by:   Corwin Levins MD   Signed by:   Corwin Levins MD on 10/26/2010   Method used:   Print then Give to Patient   RxID:   [id number]    Orders Added: 1)  Radiology Referral [Radiology] 2)  Est. Patient 40-64 years [01027]   Immunization History:  Influenza Immunization History:    Influenza:  historical (06/20/2010)   Immunization History:  Influenza Immunization History:    Influenza:  Historical (06/20/2010)

## 2010-11-11 NOTE — Miscellaneous (Signed)
Summary: Orders Update   Clinical Lists Changes  Problems: Added new problem of THYROID NODULE, LEFT (ICD-241.0) Orders: Added new Referral order of Misc. Referral (Misc. Ref) - Signed

## 2010-11-16 ENCOUNTER — Other Ambulatory Visit: Payer: Self-pay | Admitting: Surgery

## 2010-12-01 ENCOUNTER — Other Ambulatory Visit: Payer: Self-pay | Admitting: Interventional Radiology

## 2010-12-01 ENCOUNTER — Ambulatory Visit
Admission: RE | Admit: 2010-12-01 | Discharge: 2010-12-01 | Disposition: A | Discharge status: Home / Self Care | Payer: 59 | Source: Ambulatory Visit | Attending: Internal Medicine | Admitting: Internal Medicine

## 2010-12-01 ENCOUNTER — Other Ambulatory Visit (HOSPITAL_COMMUNITY)
Admission: RE | Admit: 2010-12-01 | Discharge: 2010-12-01 | Disposition: A | Discharge status: Home / Self Care | Payer: 59 | Source: Ambulatory Visit | Attending: Interventional Radiology | Admitting: Interventional Radiology

## 2010-12-01 DIAGNOSIS — E041 Nontoxic single thyroid nodule: Secondary | ICD-10-CM

## 2010-12-01 DIAGNOSIS — E049 Nontoxic goiter, unspecified: Secondary | ICD-10-CM | POA: Insufficient documentation

## 2010-12-02 ENCOUNTER — Other Ambulatory Visit: Payer: Self-pay | Admitting: Otolaryngology

## 2010-12-02 ENCOUNTER — Other Ambulatory Visit: Payer: 59

## 2010-12-02 ENCOUNTER — Ambulatory Visit (HOSPITAL_BASED_OUTPATIENT_CLINIC_OR_DEPARTMENT_OTHER)
Admission: RE | Admit: 2010-12-02 | Discharge: 2010-12-02 | Disposition: A | Discharge status: Home / Self Care | Payer: 59 | Source: Ambulatory Visit | Attending: Otolaryngology | Admitting: Otolaryngology

## 2010-12-02 DIAGNOSIS — D2330 Other benign neoplasm of skin of unspecified part of face: Secondary | ICD-10-CM | POA: Insufficient documentation

## 2010-12-10 ENCOUNTER — Telehealth: Payer: Self-pay

## 2010-12-10 NOTE — Telephone Encounter (Signed)
Please ask pt to present for UA and culture  To robin to arrange

## 2010-12-10 NOTE — Telephone Encounter (Signed)
Called patient and informed to come to the lab to check urine. The patient agreed and will come 12/11/2010. Scheduled pt. For UA and culture in the lab on 12/11/2010

## 2010-12-10 NOTE — Telephone Encounter (Signed)
Pt called requesting UA/ABX for UTI sxs, frequency, urgency and decrease in volume. PT states there are no appt available until mid next week, please advise.

## 2010-12-11 ENCOUNTER — Other Ambulatory Visit: Payer: 59

## 2010-12-11 NOTE — Op Note (Signed)
  NAME:  [name] [name], [name] [name] NO.:  [id number]  MEDICAL RECORD NO.:  [id number]            PATIENT TYPE:  LOCATION:                                 FACILITY:  PHYSICIAN:  Kristine Garbe. Ezzard Standing, M.D. DATE OF BIRTH:  DATE OF PROCEDURE: DATE OF DISCHARGE:                              OPERATIVE REPORT   PREOPERATIVE DIAGNOSIS:  Left intranasal lesion, status post biopsy by Dr. Terri Piedra, which showed benign fibrous histiocytoma.  POSTOPERATIVE DIAGNOSIS:  Left intranasal lesion, status post biopsy by Dr. Terri Piedra, which showed benign fibrous histiocytoma.  OPERATION:  Wide excision of left intranasal lesion.  SURGEON:  Kristine Garbe. Ezzard Standing, MD  ANESTHESIA:  Local, Xylocaine with epinephrine, 4 mL.  COMPLICATIONS:  None.  CLINICAL NOTE:  [name] [name] is a 55 year old female who has had a nodule in the left nostril for several months now.  She had excisional biopsy of this performed by Dr. Terri Piedra and final pathology report revealed spindle cell proliferation consistent with a benign fibrous histiocytoma.  Apparently, this can be locally aggressive, and it was recommended that the patient has wide excision of this biopsy site.  On examination, she has a small scab or sore into the cartilaginous septum of the left nostril.  She was taken to the operating room at this time for a wide local excision of this previous biopsy site.  DESCRIPTION OF PROCEDURE:  The area was injected with 3-4 mL of Xylocaine with epinephrine for local anesthetic.  The previous biopsy site was marked and then this area was excised with about 6-8 mm margins around the previous biopsy site to create a defect of about 1.5 to 2 cm in size.  Excision was carried down to the cartilage and perichondrium of the septal cartilage.  Perichondrium was excised along with the overlying tissue.  Specimen was marked with a long suture at the 12 o'clock position and a short suture at the 6 o'clock position to  mark the superior and inferior aspects of the excision site.  The area was then cauterized using silver nitrite for hemostasis.  After obtaining adequate hemostasis, bacitracin ointment and a cotton ball packing and dressing was applied to the nose.  [name] tolerated this well.  RECOMMENDATIONS:  She is discharged to home later today, placed her on Keflex 5 mg b.i.d. for a weak.  Instructed to keep the area clean and apply antibiotic ointment once or twice a day and will have a followup in my office in 1 week for recheck to review pathology.          ______________________________ Kristine Garbe Ezzard Standing, M.D.     CEN/MEDQ  D:  12/02/2010  T:  12/03/2010  Job:  [id number]  cc:   Gelene Mink A. Worthy Rancher, M.D.  Electronically Signed by Dillard Cannon M.D. on 12/11/2010 11:12:29 AM

## 2011-01-25 ENCOUNTER — Ambulatory Visit (INDEPENDENT_AMBULATORY_CARE_PROVIDER_SITE_OTHER): Payer: 59 | Admitting: Obstetrics and Gynecology

## 2011-01-25 DIAGNOSIS — B373 Candidiasis of vulva and vagina: Secondary | ICD-10-CM

## 2011-01-25 DIAGNOSIS — N898 Other specified noninflammatory disorders of vagina: Secondary | ICD-10-CM

## 2011-01-25 DIAGNOSIS — B3731 Acute candidiasis of vulva and vagina: Secondary | ICD-10-CM

## 2011-01-25 DIAGNOSIS — K648 Other hemorrhoids: Secondary | ICD-10-CM

## 2011-02-02 NOTE — Assessment & Plan Note (Signed)
[facility]                             PULMONARY OFFICE NOTE   NAME:[name], [name] LEWING                     MRN:          [id number]  DATE:07/07/2007                            DOB:          May 06, 1956    PROBLEMS:  1. Allergic rhinitis.  2. Pharyngitis.  3. Eustachian dysfunction.  4. Esophageal reflux.  5. Biliary colic.  6. Food intolerance to CRAB.  7. LATEX allergy/rash.   PRIMARY CARE PHYSICIAN:  Dr. Oliver [name].   GI PHYSICIAN:  Dr. Yancey [name].   HISTORY:  Still occasional sore throats, otherwise doing quite well. She  is aware of reflux diagnosis and Dr. Marina [name] is watching her gallbladder.  She asks about ENT evaluation because of the sore throats. We had  discussed this previously. Sinus CT in 2007 had shown mild sphenoid  sinus mucosal thickening. She has some second hand smoke exposure.  Several members of her family have been heavy smokers with lung cancers,  and she is concerned about her risk, although she has never been a  smoker herself. She denies any chest symptoms currently and wants to  wait on a chest x-ray.   MEDICATIONS:  1. Crestor 20 mg.  2. Potassium 20 mEq b.i.d.  3. Diovan 320/25.  4. Cetirizine 10 mg.  5. Vitamins.  6. Guaifenesin.  7. Diphenhydramine 25 mg b.i.d.  8. Sudafed PE b.i.d.  9. Aspirin 81 mg.  10.Nitrofurantoin.  11.Prilosec 20 mg.   Drug intolerant LIPITOR with muscle ache.   OBJECTIVE:  Weight 183 pounds, blood pressure 126/76, pulse 90, room air  saturation 98%. Tympanic membranes are clear. Nasal mucosa is  unremarkable. There is minor erythema, but mostly on the soft palette,  but also some on the posterior pharyngeal wall. Voice quality is normal.  There is no cervical adenopathy, strider, or thyromegaly. Chest is  clear.   IMPRESSION:  Allergic rhinitis and food allergy.   PLAN:  1. Continue her allergy vaccine at 1 to 10. She offers that this is      doing well for her.  2. Follow  up for GI issues with Dr. Marina [name].  3. Schedule return 6 months, earlier p.r.n.  4. We discussed warning symptoms that could suggest chest cancer,      emphasizing that she might feel nothing, and if she has concern,      she should bring it up again with her physicians. She was quite      comfortable with this approach.     [name] D. Maple Hudson, MD, [name] [name], FACP  Electronically Signed    CDY/MedQ  DD: 07/08/2007  DT: 07/10/2007  Job #: [id number]

## 2011-02-02 NOTE — Assessment & Plan Note (Signed)
[facility]                         GASTROENTEROLOGY OFFICE NOTE   NAME:[name], [name] TAMURA                     MRN:          [id number]  DATE:09/07/2007                            DOB:          [birth year]    HISTORY:  [name] [name] presents today for followup.  She is a 55 year old with  hypertension, hyperlipidemia, gastroesophageal reflux disease, and  remote ischemic colitis.  She was evaluated in the office June 20, 2007 for epigastric and chest pain.  See that dictation for details.  She underwent upper endoscopy to evaluate the pain.  This was normal,  except for the presence of benign fundic gland polyps (biopsy proven).  She also underwent surveillance colonoscopy, which revealed 1 diminutive  adenoma.  This was removed.  Followup in 5 years recommended.  She has  had previous negative gallbladder ultrasound.  Symptoms were possibly  thought due to reflux that she was having some breakthrough.  Her  Prilosec was increased to 20 mg b.i.d. and followup at this time  recommended.  The patient states that, for the most part, she has been  doing well for about 3 consecutive evenings when she developed squeezing  chest discomfort, which lasted approximately 15 to 20 minutes.  She does  mention that she has had difficulty taking her second dose of proton  pump inhibitor and it seems that reflux symptoms and chest discomfort  are worse when she does not take that second dose of medication.  She  has tried antacids as a rescue strategy with mixed results.  Chest  discomfort has been fairly localized.  No abdominal pain.  No radiation  of discomfort.  Some nausea and indigestion, as well as belching.  No  dysphagia.  She inquires about possible occult gallbladder disease.   CURRENT MEDICATIONS:  Crestor.  Potassium chloride.  Diovan.  Cetirizine.  Vitamin D.  Diphenhydramine.  Sudafed.  Baby aspirin.  Glucosamine chondroitin.  Prilosec.  Allergy shots.   PHYSICAL EXAM:  Well-appearing female in no acute distress.  Her blood pressure is 130/88, heart rate is 72, weight 179.2 pounds.  HEENT:  Sclerae anicteric.  ABDOMEN:  Soft without tenderness, mass, or hernia.  Good bowel sounds  heard.   IMPRESSION:  1. Gastroesophageal reflux disease.  Some breakthrough symptoms with      dietary indiscretion and medication noncompliance.  2. Recurrent chest pain as described above, possibly due to reflux or      esophageal spasm.  Occult gallbladder disease is possible, though      less likely.  3. History of adenomatous colon polyps on recent colonoscopy.  4. General medical problems.   RECOMMENDATIONS:  1. The patient advised to take proton pump inhibitor b.i.d. 30 minutes      before meals.  2. Reflux precautions with attention to diet.  3. Rescue antacids p.r.n.  4. Schedule HIDA scan (after the first of the year per the patient      preference) to rule out      chronic cholecystitis.  5. Routine office followup in 6 weeks.     Wilhemina Bonito. Marina Goodell, MD  Electronically  Signed    JNP/MedQ  DD: 09/07/2007  DT: 09/07/2007  Job #: [id number]   cc:   Corwin Levins, MD

## 2011-02-02 NOTE — Assessment & Plan Note (Signed)
[facility]                         GASTROENTEROLOGY OFFICE NOTE   NAME:[name] [name], [name] [name]                     MRN:          [id number]  DATE:06/20/2007                            DOB:          04-20-1956    REASON FOR CONSULTATION:  Epigastric and chest pain.   HISTORY OF PRESENT ILLNESS:  This is a 55 year old white female with  hypertension, hyperlipidemia, chronic gastroesophageal reflux disease,  and  remote history of ischemic colitis.  She is referred today through  the courtesy of Dr. Jonny Ruiz regarding epigastric and chest pain.  The  patient reports longstanding problems with heartburn and indigestion for  which she has been on proton pump inhibitors intermittently.  On proton  pump inhibitors her symptoms are generally well-controlled.  No  dysphagia.  She underwent upper endoscopy in 2001 with Dr. Sharrell Ku.  This was normal.  She reports being in her usual state of  health until the early part of August when she was awoken with sharp  pain and burning discomfort in the lower chest epigastric region.  She  thought I was having a heart attack.  There was associated belching  and nausea.  The discomfort resolved in 15-20 minutes and did not  return.  She had been using nonsteroidal anti-inflammatory drugs which  she discontinued.  As well she had been off her proton pump inhibitor  which she resumed.  She did well until about two weeks ago when she had  recurrence of the same pain.  This time it was associated with nausea  and vomiting.  Discomfort seemed to favor the right side a bit.  Again  after about 20 minutes the discomfort resolved.  She has not had  recurrence.  She did undergo some workup including laboratories April 28, 2007.  Urinalysis was unremarkable.  Amylase and lipase were normal.  Liver function tests revealed a minimal elevation of transaminases that  were, otherwise, normal.  CBC and basic metabolic panel were  unremarkable.  She also underwent an abdominal ultrasound which was  performed May 08, 2007.  She was found to have diffuse fatty  infiltration of the liver, also cystic lesion in the right kidney.  The  gallbladder was sonographically normal.  She is now referred.  She has  had no further episodes of pain.  A GI Review of Systems is, otherwise,  remarkable for mild intermittent altered bowel habits, gas with  bloating, and 10-pound weight gain over the past 10 months.   PAST MEDICAL HISTORY:  1. Hypertension.  2. Hyperlipidemia.  3. Gastroesophageal reflux disease.  4. Remote history of ischemic colitis.  5. Status post appendectomy.  6. Status post bladder tach for urinary incontinence.   ALLERGIES:  LIPITOR which resulted in myalgias.   CURRENT MEDICATIONS:  1. Crestor 20 mg daily.  2. Potassium chloride 20 mEq b.i.d.  3. Diovan 320/25 mg daily.  4. Cefatrizine 10 mg daily.  5. Vitamin D.  6. Guaifenesin.  7. Diphenhydramine 25 mg b.i.d.  8. Sudafed.  9. Baby aspirin.  10.Nitrofurantoin 100 mg at night.  11.Glucosamine chondroitin.  12.Prilosec 20  mg daily.   FAMILY HISTORY:  No family history of gastrointestinal malignancy.  Uncle with Crohn's disease.   SOCIAL HISTORY:  The patient is single without children.  She lives  alone.  She attended college for four years.  Works as a Artist  at [employer].  She does not smoke, occasionally uses alcohol.   REVIEW OF SYSTEMS:  Per diagnostic evaluation form.   PHYSICAL EXAMINATION:  GENERAL:  A well-appearing female in no acute  distress.  VITAL SIGNS:  Blood pressure 122/82, heart rate 84, weight 183 pounds, 5  feet 2 inches.  HEENT:  Sclerae anicteric, conjunctivae are pink.  Oral mucosa is  intact.  NECK:  No adenopathy.  LUNGS:  Clear.  HEART:  Regular.  ABDOMEN:  Soft without tenderness, mass or hernia.  EXTREMITIES:  Without edema.   IMPRESSION:  1. Recent problems with severe short-lived epigastric  and lower chest      discomfort most reminiscent of biliary colic.  Negative ultrasound.      Symptoms possibly due to breakthrough reflux symptoms or peptic      ulcer disease.  2. Chronic gastroesophageal reflux disease with negative endoscopy in      2001.  3. Colon cancer screening baseline risk.  No prior evaluation.  4. General medical problems.   RECOMMENDATIONS:  1. Increased proton pump inhibitor to b.i.d. dosage.  2. Schedule upper endoscopy to evaluate pain.  3. Schedule colonoscopy for colorectal neoplasia screening.  The      nature of both procedures as well as the risks, benefits, and      alternatives were reviewed.  She understood and agreed to proceed.  4. If endoscopic evaluation is negative and pain returns on b.i.d.      proton pump inhibitor      therapy, consider biliary HIDA scan.  5. Ongoing general medical care with Dr. Jonny Ruiz.     Wilhemina Bonito. Marina Goodell, MD  Electronically Signed    JNP/MedQ  DD: 06/20/2007  DT: 06/21/2007  Job #: [id number]   cc:   Corwin Levins, MD

## 2011-02-02 NOTE — Assessment & Plan Note (Signed)
[facility]                         GASTROENTEROLOGY OFFICE NOTE   NAME:[name] [name], [name] [name]                     MRN:          [id number]  DATE:10/25/2007                            DOB:          04-19-1956    HISTORY:  [name] [name] presents today for followup. She is a 55 year old with  hypertension, hyperlipidemia, gastroesophageal reflux disease, and  remote ischemic colitis. She was evaluated back in September for  epigastric and chest pain. Upper endoscopy was normal except for benign  fundic gland polyps. Surveillance colonoscopy at that time revealed a  dimunitive adenoma only. She had a negative gallbladder ultrasound. At  the time of followup in December, the patient was having some  breakthrough reflux symptoms with dietary indiscretion and medication  non-compliance. Also, some recurrent chest pain possibly due to reflux  or spasm. Occult gallbladder disease was considered. The patient was  advised to take her proton pump inhibitor 30 minutes before meals. She  states that she has been compliant. We discussed reflux precautions and  weight loss. Also, rescue antacids. A HIDA scan was performed in early  January and returned normal. She presents now for followup. The patient  tells me that she does continue with some heartburn and indigestion  despite the adjusted dose of Prilosec. She has had no success with  weight loss. She has actually had some weight gain. No dysphagia. She  has had some indigestion and chest discomfort on a couple of occasions  using ibuprofen. She also mentions some water brash. She did ask if it  was okay to use Pepcid periodically. No abdominal pain. No other issues  or problems.   SHE REMAINS ALLERGIC OR INTOLERANT TO LIPITOR AND AUGMENTIN.   CURRENT MEDICATIONS:  1. Crestor.  2. Potassium chloride.  3. Diovan.  4. Vitamin D.  5. Guaifenesin.  6. Benadryl.  7. Sudafed.  8. Baby aspirin.  9. Glucosamine  chondroitin.  10.Prilosec.  11.Allergy shots.  12.Cetirizine.   PHYSICAL EXAMINATION:  Finds a well-appearing female in no acute  distress. Blood pressure 118/78, heart rate 84, weight 182.8 pounds.  HEENT: Sclerae anicteric.  LUNGS:  Are clear.  HEART: Is regular.  ABDOMEN: Obese, and soft without tenderness, mass or hernia.   IMPRESSION:  1. Gastroesophageal reflux disease, ongoing, despite b.i.d. proton      pump inhibitor.  2. NONSTEROIDAL ANTI-INFLAMMATORY DRUG INTOLERANCE.  3. General medical problems as previously outlined.   RECOMMENDATIONS:  1. Increase Prilosec from 20 mg b.i.d. to 40 mg b.i.d.  2. Reflux precautions with strict attention to weight loss.  3. Try different nonsteroidal's or Tylenol for joint pain.  4. Ongoing general medical care with Dr. Jonny Ruiz.  5. Surveillance colonoscopy in five years as recommended.  6. Interval GI followup p.r.n.     Wilhemina Bonito. Marina Goodell, MD  Electronically Signed    JNP/MedQ  DD: 10/25/2007  DT: 10/26/2007  Job #: [id number]   cc:   Corwin Levins, MD

## 2011-02-05 NOTE — Op Note (Signed)
[facility]  Patient:    [name] [name], [name] [name] Visit Number: [id number] MRN: [id number]          Service Type: DSU Location: [facility] Attending Physician:  Sharon Mt Dictated by:   Rande Brunt. Eda Paschal, M.D. Proc. Date: 01/11/02 Admit Date:  01/11/2002 Discharge Date: 01/11/2002                             Operative Report  PREOPERATIVE DIAGNOSES:       Dysfunctional uterine bleeding with endometrial polyps suspected.  POSTOPERATIVE DIAGNOSES:      Dysfunctional uterine bleeding with endometrial polyps.  NAME OF OPERATION:            Hysteroscopy, dilatation and curettage, excision of endometrial polyps.  SURGEON:                      Daniel L. Eda Paschal, M.D.  ANESTHESIA:                   IV Versed, Fentanyl, paracervical block.  INDICATIONS:                  The patient is a 55 year old female who presented to the office after a period of amenorrhea with persistent spotting. FSH was normal.  Sonohysterogram revealed an intrauterine cavity defect.  As a result of this she comes to the operating room for [facility], hysteroscopy, and excision of endometrial polyps.  PROCEDURE:                    Patient was taken to the operating room, placed in the dorsal lithotomy position, prepped and draped in the usual sterile manner.  She was given IV Fentanyl and Versed by anesthesia.  Dr. Arby Barrette requested I give her a paracervical block as well and she got a 20 cc 1% plain paracervical block.  Anesthesia was excellent.  Her cervix was dilated to a #33 Pratt dilator.  A hysteroscopic examination was done with the hysteroscopic resectoscope using 3% Sorbitol to expand the intrauterine cavity and a camera for magnification.  Pictures were taken.  The endometrial polyps were resected with a curette rather than with the wire loop because it was easier to do so.  A vigorous curettage was done.  She was rehysteroscoped and all the intrauterine defects were  removed.  The procedure was terminated. Estimated blood loss  was less than 100 cc with none replaced.  Fluid deficit was less than 100 cc.  The patient tolerated procedure well and left the operating room in satisfactory condition. Dictated by:   Rande Brunt. Eda Paschal, M.D. Attending Physician:  Sharon Mt DD:  01/11/02 TD:  01/11/02 Job: [id number]

## 2011-02-05 NOTE — Assessment & Plan Note (Signed)
 [facility]                             PULMONARY OFFICE NOTE   NAME:[name], [name]                     MRN:          [id number]  DATE:12/27/2006                            DOB:          [birth year]    PROBLEMS:  1. Allergic rhinitis.  2. Pharyngitis.  3. Eustachian dysfunction.  4. Probable esophageal reflux.  5. Food intolerance to CRAB.  6. LATEX allergy/rash.   HISTORY OF PRESENT ILLNESS:  She was seen by our nurse practitioner in  February complaining of persistent sore throat and fatigue treated with  Magic Mouthwash.  We reviewed previous lack of success with steroid  nasal sprays and Singulair, avoiding contact with LATEX and suspect  foods.  Continue his allergy vaccine at 1:10 here with no problems.  Mild head congestion in the past week trying Mucinex.  She has been  concerned that Sudafed might raise blood pressure, and we discussed  short term limited use of Sudafed PE.  She likes Zyrtec.  Feels ears  squeak and pop.  So far hearing is okay.   MEDICATIONS:  1. Zyrtec.  2. Potassium 20 mEq b.i.d.  3. Crestor 20 mg.  4. Aspirin 81 mg.  5. Prilosec 20 mg.  6. Allergy vaccine.  7. Macrobid.  8. Tums.  9. Mucinex.   ALLERGIES:  Drug intolerant to LIPITOR. CEFTIN, and AVELOX cause  diarrhea.   SUBJECTIVE:  VITAL SIGNS:  Weight 177 pounds, BP 122/74, pulse 76, room  air saturation 95%.  HEENT:  Tympanic membranes are retracted.  I do not find adenopathy.  Nasal airway is not obstructed.  Pharynx is maybe minimally reddened.  CHEST:  Sounds quiet.  I do not hear wheeze or cough.  HEART:  Heart sounds are regular without murmur.  EXTREMITIES:  There is no edema.   Chest x-ray in October had shown no active cardiopulmonary disease.  A  limited CT scan of the sinuses on Jan 25, 2006 has shown mild sphenoid  sinus disease.   IMPRESSION:  1. Rhinitis/rhinosinusitis.  2. Eustachian dysfunction.  3. Contact dermatitis to LATEX.  4. Food sensitivity to CRAB.   PLAN:  1. Continue allergy vaccine at 1:10.  2. Continue environmental precautions and avoidance of problem      exposures.  3. Trial of Sudafed PE as tolerated along with baseline medications as      before.  4. Schedule return in one year with me but earlier if p.r.n.     Clinton D. Maple Hudson, MD, Tonny Bollman, FACP  Electronically Signed    CDY/MedQ  DD: 12/27/2006  DT: 12/28/2006  Job #: [id number]

## 2011-02-05 NOTE — Op Note (Signed)
[facility]  Patient:    [name] [name], [name] [name] Visit Number: [id number] MRN: [id number]          Service Type: DSU Location: [facility] Attending Physician:  Sharon Mt Dictated by:   Rande Brunt. Eda Paschal, M.D. Proc. Date: 01/11/02 Admit Date:  01/11/2002                             Operative Report  CONTINUATION:  FINDINGS:                     _______ was within normal limits.  The cervix was clean.  The uterus was enlarged by multiple myomas to about eight to nine weeks size.  Adnexa were palpably normal.  At the time of hysteroscopy, the patient had several endometrial polyps within the cavity.  One of them was approximately 1.5 cm and one was approximately three quarters of a centimeter. It came off the right and left lateral walls of the fundus.  Once they had been removed, the rest of the exploration of the endometrial cavity and endocervical canal were normal. Dictated by:   Rande Brunt. Eda Paschal, M.D. Attending Physician:  Sharon Mt DD:  01/11/02 TD:  01/12/02 Job: [id number]

## 2011-02-05 NOTE — Assessment & Plan Note (Signed)
[facility]                               PULMONARY OFFICE NOTE   NAME:Tasker, SAUMYA HUKILL                     MRN:          [id number]  DATE:06/30/2006                            DOB:          11-Nov-1955    PRIMARY CARE PHYSICIAN:  Corwin Levins, M.D.   PROBLEMS:  1. Allergic rhinitis.  2. Eustachian dysfunction.  3. Probable esophageal reflux.  4. Food intolerance to crab.  5. Latex allergy/rash.   HISTORY:  She is now on Mucinex complaining that for the past week she has  had increased cough, scratchy throat, sneezing, with scant clear sputum.  There has been no fever, no real sore throat, and no exposure to anybody  that she recognized as sick.  She describes history of second hand smoke  exposure in childhood although she never smoked herself.  Several smokers in  the family died with complications of lung disease and lung cancer and she  hopes to avoid that by maintaining general health and lung function as well  as she can which is her primary concern with the respiratory complaints.  She continues allergy vaccine here at 1:10 with no problems and has felt  that it helped her.   MEDICATIONS:  1. Zyrtec 10 mg.  2. HCTZ 25 mg.  3. Diovan 320 mg.  4. Potassium 20 mEq b.i.d.  5. Crestor 20 mg.  6. Aspirin 81 mg.  7. Prilosec 20 mg.   DRUG INTOLERANCE:  1. LIPITOR.  2. CEFTIN.  3. AVELOX which she says caused diarrhea.   PHYSICAL EXAMINATION:  VITAL SIGNS: Weight 165 pounds, blood pressure  120/66, pulse regular at 92, room air saturation 96%.  HEENT:  There is turbinate edema and a lot of clear mucous bridging.  No  adenopathy.  Pharynx is not red.  LUNGS:  Clear.  HEART:  Regular without murmur.   IMPRESSION:  Mild rhinitis and tracheitis.  Nonspecific pattern but this is  probably viral,   PLAN:  1. Saline lavage.  2. Entex PSE one b.i.d. p.r.n. for congestion.  3. Continue allergy vaccine at 1:10.  4. Order chest x-ray.  5. Schedule return in one year or earlier p.r.n.  6. Consider pulmonary function tests.       Clinton D. Maple Hudson, MD, FCCP, FACP      CDY/MedQ  DD:  06/30/2006  DT:  07/02/2006  Job #:  [id number]

## 2011-02-05 NOTE — Assessment & Plan Note (Signed)
[facility]                             PULMONARY OFFICE NOTE   NAME:[name] [name], [name] [name]                     MRN:          [id number]  DATE:11/08/2006                            DOB:          11-27-1955    HISTORY OF PRESENT ILLNESS:  The patient is a 55 year old white female  patient of Dr. Roxy Cedar  who has a known history of allergic rhinitis and  eustachian tube dysfunction who presents for an acute office visit. The  patient complains of an a two week history of intermittent sore throat,  painful swallowing and some raised bumps along the posterior part of her  throat. The patient also complains that approximately two weeks ago on  February the 1. She was at a restaurant and started feeling somewhat  nauseous and felt like she was going to hyperventilate and when she  stood up she felt faint and passed out. She did not have any associated  chest pain, palpitations, headache, visual changes or extremity  weakness. She has had no further symptoms since this episode. Did not  seek medical attention. The patient denies any recent travel, antibiotic  use or rash. The patient does complain that she has been quite fatigued.   PAST MEDICAL HISTORY:  Reviewed.   CURRENT MEDICATIONS:  Reviewed.   PHYSICAL EXAMINATION:  The patient is a pleasant female in no acute  distress. She is afebrile with stable vital signs. O2 saturation is 95%  on room air.  HEENT: Conjunctivae non-injected. Nasal mucosa is normal. Tympanic  membranes are normal. EACs are clear. Posterior pharynx is with some  mild erythema and a few scattered bumps along the posterior pharynx.  NECK: No tonsillar edema is noted. Supple without cervical adenopathy.  No JVD.  LUNGS: Lung sounds are clear to auscultation bilaterally without any  wheezing or crackles.  CARDIAC: Regular rate and rhythm without murmur, rub or gallop.  ABDOMEN: Soft, with no hepatomegaly. The patient's spleen possibly  is  just slightly palpable. Bowel sounds are positive throughout all four  quadrants. No guarding or rebound noted.  EXTREMITIES: Warm without calf cyanosis, clubbing or edema.  SKIN: Is warm without rash.   LABORATORY DATA:  Negative strep test.   IMPRESSION/PLAN:  1. Persistent sore throat with persistent fatigue. The patient will be      tested for a mono spot. Labs included a CBC, CMET are pending. The      patient may use Magic Mouthwash as needed for sore throat. If      symptoms persist, may need referral to ENT.  2. A pre-syncope/syncopal episode at possibly two weeks ago. The      patient had no associated symptoms. Possibly could be vaso-vagal.      However, the patient has been recommended to followup with her      primary care physician, Dr. Jonny Ruiz and has been made an appointment      on      November 15, 2006 at 2:15.  The patient is advised that if she      develops any chest pain, palpitations or dizziness  she is to      contact the emergency department.      Rubye Oaks, NP  Electronically Signed      Clinton D. Maple Hudson, MD, Tonny Bollman, FACP  Electronically Signed   TP/MedQ  DD: 11/08/2006  DT: 11/08/2006  Job #: [id number]

## 2011-02-05 NOTE — Assessment & Plan Note (Signed)
[facility]                             PULMONARY OFFICE NOTE   NAME:[name] [name], [name] [name]                     MRN:          [id number]  DATE:08/29/2006                            DOB:          02-Aug-1956    HISTORY OF PRESENT ILLNESS:  The patient is a 55 year old female patient  of Dr. Roxy Cedar who has a known history of allergic rhinitis and  eustachian tube dysfunction presents with a one week history of nasal  congestion, postnasal drip, sore throat and cough.  The patient denies  any chest pain, hemoptysis, recent travel.  The patient has taken a left  over Z-Pak which she took the last dose today.   PAST MEDICAL HISTORY:  Her past medical history is reviewed.   CURRENT MEDICATIONS:  Current medications are reviewed.   PHYSICAL EXAMINATION:  GENERAL APPEARANCE: The patient is a pleasant  female in no acute distress.  VITAL SIGNS: She is afebrile with stable vital signs.  HEENT:  Nasal mucosa with some mild redness. Nontender sinuses to  percussion.  Posterior pharynx with some mild redness, no exudates  noted.  NECK:  Supple without adenopathy.  LUNGS:  Sounds are clear.  CARDIAC:  Regular rate.  ABDOMEN:  Soft, benign.  EXTREMITIES:  Warm without any edema.   LABORATORY DATA:  Strep test is negative.   IMPRESSION:  Acute upper respiratory tract infection.   PLAN:  Patient is given Mucinex DM twice daily.  Nasal hygiene regimen  with saline and Afrin Nasal spray x5 days.  Endal HD, 8 ounces, one to  two teaspoons every 4 to 6 hours as needed for cough.  The patient was  given Xopenex nebulizer treatment in the office.  The patient is to  return back with Dr. Maple Hudson as needed or sooner p.r.n.     Rubye Oaks, NP  Electronically Signed      Clinton D. Maple Hudson, MD, Tonny Bollman, FACP  Electronically Signed   TP/MedQ  DD: 08/29/2006  DT: 08/30/2006  Job #: [id number]

## 2011-02-05 NOTE — Op Note (Signed)
NAME:  [name] [name], [name] [name] NO.:  [id number]   MEDICAL RECORD NO.:  [id number]          PATIENT TYPE:  AMB   LOCATION:  DAY                          FACILITY:  [facility]   PHYSICIAN:  Maretta Bees. Vonita Moss, M.D.DATE OF BIRTH:  1956/07/13   DATE OF PROCEDURE:  10/03/2006  DATE OF DISCHARGE:                               OPERATIVE REPORT   PREOPERATIVE DIAGNOSIS:  Stress urinary incontinence.   POSTOPERATIVE DIAGNOSIS:  Stress urinary incontinence.   PROCEDURE:  Obturator sling and cystoscopy.   SURGEON:  Dr. Larey Dresser.   ANESTHESIA:  General.   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  She has a ovarian cyst but Dr.  Eda Paschal is not planning any immediate surgery and she wanted  correction of her stress incontinence.  She was advised about obturator  sling and the risks of infection, retention, erosion or bladder injury.   PROCEDURE:  The patient brought to the operating room, placed in  lithotomy position.  The lower abdomen, external genitalia and perineum  prepped and draped in usual fashion.  Foley catheter was inserted and  the bladder was drained of urine and catheter plug inserted.  With the  aid of a weighted speculum, the anterior vaginal wall was noted along  with the position of the bladder neck by palpation of the Foley.  A  suburethral incision was made in the vaginal mucosa to easily dissect  this tissue laterally to the endopelvic fascia by using the  Strully scissors and blunt dissection.  Stab wounds were made  bilaterally at the level of the clitoris over the medial aspect of the  obturator fossa on each side.  The Obtryx needle passer was used to  march behind the obturator bone and enter the endopelvic fascia and come  out mid urethrally on each side with no button holing of tissue and  having emptied the bladder before this, a needle was passed.  The sling  was loaded on each needle  and brought back up through the stab wounds  and then she underwent cystoscopy and there was no evidence of bladder  or urethral injury or perforation.  The sling was then placed in good  position in mid urethra with a hemostat that easily slid between the  urethra and the slings so it was not too tight.  Wound was irrigated  with antibiotic solution.  The excess sling material was excised and  removed.  At this point the Foley catheter back in and the sling in good  position, the vaginal mucosa was closed with running 2-0 Vicryl.  Dermabond was used to secure the stab wounds bilaterally.  Foley  catheter was connected to closed drainage and a Estrace impregnated  vaginal pack was placed without difficulty.  She was then taken to  recovery room in good condition having tolerated the procedure well.  Sponge,  needle, instrument count was correct.  Estimated blood loss was 10 mL.  Latex precautions were utilized during the case.   INDICATIONS:  Thank      Maretta Bees. Vonita Moss,  M.D.  Electronically Signed     LJP/MEDQ  D:  10/03/2006  T:  10/03/2006  Job:  [id number]   cc:   Reuel Boom L. Eda Paschal, M.D.  Fax: [phone]

## 2011-02-05 NOTE — Assessment & Plan Note (Signed)
[facility]                               PULMONARY OFFICE NOTE   NAME:[name] [name] [name] [name]                     MRN:          [id number]  DATE:05/24/2006                            DOB:          12/01/1955    HISTORY OF PRESENT ILLNESS:  The patient is a 55 year old white female  patient of Dr. Maple Hudson with a known history of allergic rhinitis, chronic  Eustachian dysfunction that presents for an acute office visit.  The patient  complains that she was seen at her family doctor four days ago for a nasal  congestion, ear pain, and was diagnosed with a right otitis media.  The  patient did have some ear drainage as well.  She was prescribed Biaxin and  Floxin ear drops.  She reports she has had only minimal improvement in  symptoms and has had some mild dizziness.  She denies any headache, visual  changes, chest pain, presyncopal or syncopal episodes, or extremity  weakness.   PHYSICAL EXAMINATION:  GENERAL:  Patient is a pleasant 55 year old in no  acute distress.  VITAL SIGNS:  She is afebrile with stable vital signs.  O2 saturations 96%  on room air.  HEENT:  Left TM is normal.  Right TM has some mild fluid, no erythema is  noted.  NECK:  Supple.  LUNGS:  Clear.  CARDIAC:  Regular rate and rhythm.  ABDOMEN:  Soft, nontender.  EXTREMITIES:  Warm without any edema.   IMPRESSION AND PLAN:  Recent otitis media and rhinitis symptoms.  Patient is  to finish her Biaxin and Floxin as scheduled.  Add Mucinex twice daily along  with some saline nasal spray.  Meclizine as needed for dizziness.  The  patient is to return to see Dr. Maple Hudson in two weeks or sooner if needed.  If  symptoms do not improve, will need to return sooner.                                   Rubye Oaks, NP                                Clinton D. Maple Hudson, MD, [facility], FACP   TP/MedQ  DD:  05/24/2006  DT:  05/25/2006  Job #:  [id number]

## 2011-02-26 ENCOUNTER — Other Ambulatory Visit: Payer: Self-pay | Admitting: Obstetrics and Gynecology

## 2011-02-26 DIAGNOSIS — Z1231 Encounter for screening mammogram for malignant neoplasm of breast: Secondary | ICD-10-CM

## 2011-03-15 ENCOUNTER — Ambulatory Visit
Admission: RE | Admit: 2011-03-15 | Discharge: 2011-03-15 | Disposition: A | Discharge status: Home / Self Care | Payer: 59 | Source: Ambulatory Visit | Attending: Obstetrics and Gynecology | Admitting: Obstetrics and Gynecology

## 2011-03-15 DIAGNOSIS — Z1231 Encounter for screening mammogram for malignant neoplasm of breast: Secondary | ICD-10-CM

## 2011-04-20 ENCOUNTER — Other Ambulatory Visit: Payer: Self-pay | Admitting: Internal Medicine

## 2011-04-20 ENCOUNTER — Other Ambulatory Visit: Payer: 59

## 2011-04-20 DIAGNOSIS — IMO0001 Reserved for inherently not codable concepts without codable children: Secondary | ICD-10-CM

## 2011-04-26 ENCOUNTER — Ambulatory Visit: Payer: 59 | Admitting: Internal Medicine

## 2011-06-15 ENCOUNTER — Ambulatory Visit: Payer: 59 | Admitting: Internal Medicine

## 2011-07-04 ENCOUNTER — Encounter: Payer: Self-pay | Admitting: Internal Medicine

## 2011-07-04 DIAGNOSIS — Z0001 Encounter for general adult medical examination with abnormal findings: Secondary | ICD-10-CM | POA: Insufficient documentation

## 2011-07-04 DIAGNOSIS — Z0000 Encounter for general adult medical examination without abnormal findings: Secondary | ICD-10-CM | POA: Insufficient documentation

## 2011-07-08 ENCOUNTER — Ambulatory Visit: Payer: 59 | Admitting: Internal Medicine

## 2011-07-09 ENCOUNTER — Other Ambulatory Visit (INDEPENDENT_AMBULATORY_CARE_PROVIDER_SITE_OTHER): Payer: 59

## 2011-07-09 DIAGNOSIS — IMO0001 Reserved for inherently not codable concepts without codable children: Secondary | ICD-10-CM

## 2011-07-09 LAB — LIPID PANEL
Cholesterol: 172 mg/dL (ref 0–200)
HDL: 51.5 mg/dL (ref >39.00)
LDL Cholesterol: 87 mg/dL (ref 0–99)
Triglycerides: 166 mg/dL — ABNORMAL HIGH (ref 0.0–149.0)
VLDL: 33.2 mg/dL (ref 0.0–40.0)

## 2011-07-09 LAB — BASIC METABOLIC PANEL
Chloride: 103 mEq/L (ref 96–112)
GFR: 96.99 mL/min (ref >60.00)
Potassium: 3.7 mEq/L (ref 3.5–5.1)
Sodium: 141 mEq/L (ref 135–145)

## 2011-07-15 ENCOUNTER — Ambulatory Visit (INDEPENDENT_AMBULATORY_CARE_PROVIDER_SITE_OTHER): Payer: 59 | Admitting: Internal Medicine

## 2011-07-15 ENCOUNTER — Encounter: Payer: Self-pay | Admitting: Internal Medicine

## 2011-07-15 DIAGNOSIS — F411 Generalized anxiety disorder: Secondary | ICD-10-CM

## 2011-07-15 DIAGNOSIS — I10 Essential (primary) hypertension: Secondary | ICD-10-CM

## 2011-07-15 DIAGNOSIS — E785 Hyperlipidemia, unspecified: Secondary | ICD-10-CM

## 2011-07-15 DIAGNOSIS — E119 Type 2 diabetes mellitus without complications: Secondary | ICD-10-CM

## 2011-07-15 DIAGNOSIS — Z0000 Encounter for general adult medical examination without abnormal findings: Secondary | ICD-10-CM

## 2011-07-15 NOTE — Patient Instructions (Signed)
Continue all other medications as before Please continue your excellent attention to better diet, activity and weight loss Please return in 6 mo with Lab testing done 3-5 days before

## 2011-07-15 NOTE — Progress Notes (Signed)
Subjective:    Patient ID: [name] [name], female    DOB: 04-27-1956, 55 y.o.   MRN: [id number]  HPI  Here to f/u; overall doing ok,  Pt denies chest pain, increased sob or doe, wheezing, orthopnea, PND, increased LE swelling, palpitations, dizziness or syncope.  Pt denies new neurological symptoms such as new headache, or facial or extremity weakness or numbness   Pt denies polydipsia, polyuria, or low sugar symptoms such as weakness or confusion improved with po intake.  Pt states overall good compliance with meds, trying to follow lower cholesterol, diabetic diet, wt overall stable but little exercise however.  Has been working very hard on diet and activity to avoid meds for DM.  Overall good compliance with treatment, and good medicine tolerability.   Pt denies fever, wt loss, night sweats, loss of appetite, or other constitutional symptoms.  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  Past Medical History  Diagnosis Date  . ALLERGIC RHINITIS 04/06/2007  . ANXIETY 04/06/2007  . COLONIC POLYPS, HX OF 07/04/2008  . COLONIC POLYPS 08/04/2007  . DIABETES MELLITUS, TYPE II 10/26/2010  . DIARRHEA, ACUTE 09/23/2010  . Dysmenorrhea 11/30/2007  . ENDOMETRIAL POLYP 01/11/2002  . ENDOMETRIOSIS 11/30/2007  . GASTRIC POLYP 08/04/2007  . GERD 08/04/2007  . GOITER, MULTINODULAR 03/10/2009  . Headache 11/13/2008  . HYPERLIPIDEMIA 04/06/2007  . HYPERTENSION 04/06/2007  . ISCHEMIC COLITIS, HX OF 07/06/2007  . OTITIS MEDIA, LEFT 06/19/2009  . Palpitations 03/10/2009  . PYELONEPHRITIS, ACUTE 09/23/2010  . Rosacea 11/30/2007  . SINUSITIS- ACUTE-NOS 09/11/2008  . SKIN LESION 06/19/2009  . THYROID NODULE, LEFT 11/04/2010  . Urinary frequency 09/11/2008  . UTI'S, RECURRENT 11/30/2007  . VAGINITIS, CANDIDAL 11/26/2009   Past Surgical History  Procedure Date  . Dermoid cyst  excision 1994    right ovary  . Uterine  fibroids removed 1996  . Right foot neuroma 1988  . Removed skull cyst 1973  . Flexible sigmoidoscopy 11/16/95  . Appendectomy 2005  . Bladder tack   . Cystoscopy 2007  . Lt. pectoral/subclavicular abscess drainage 2006    reports that she has never smoked. She does not have any smokeless tobacco history on file. She reports that she drinks alcohol. She reports that she does not use illicit drugs. family history includes Allergies in her other; Asthma in her other; COPD in her other; Cancer in her maternal grandmother and mother; Crohn's disease in her other; and Heart disease in her other. Allergies  Allergen Reactions  . Atorvastatin     REACTION: extreme muscle aches  . Cefuroxime Axetil     REACTION: DIARRHEA  . Ciprofloxacin     REACTION: diarrhea  . Latex     REACTION: rash  . Moxifloxacin     REACTION: DIARRHEA   Current Outpatient Prescriptions on File Prior to Visit  Medication Sig Dispense Refill  . aspirin 81 MG EC tablet Take 81 mg by mouth daily.        . cetirizine (ZYRTEC) 10 MG tablet Take 10 mg by mouth daily.        . cholecalciferol (VITAMIN D) 1000 UNITS tablet Take 1,000 Units by mouth daily.        Marland Kitchen guaiFENesin (MUCINEX) 600 MG 12 hr tablet Take 1,200 mg by mouth 2 (two) times daily.        . Multiple Vitamin (MULTIVITAMIN) capsule Take 1  capsule by mouth daily.        Marland Kitchen omeprazole (PRILOSEC) 20 MG capsule Take 20 mg by mouth 2 (two) times daily.        Marland Kitchen oxymetazoline (AFRIN) 0.05 % nasal spray Place 2 sprays into the nose as needed.        . potassium chloride SA (KLOR-CON M20) 20 MEQ tablet Take 20 mEq by mouth 2 (two) times daily.        . rosuvastatin (CRESTOR) 20 MG tablet Take 20 mg by mouth daily.        . valsartan-hydrochlorothiazide (DIOVAN-HCT) 320-25 MG per tablet Take 1 tablet by mouth daily.         Review of Systems Review of Systems  Constitutional: Negative for diaphoresis and unexpected weight change.  HENT: Negative for drooling and  tinnitus.   Eyes: Negative for photophobia and visual disturbance.  Respiratory: Negative for choking and stridor.   Gastrointestinal: Negative for vomiting and blood in stool.  Genitourinary: Negative for hematuria and decreased urine volume.  Musculoskeletal: Negative for gait problem.     Objective:   Physical Exam BP 112/72  Pulse 71  Temp(Src) 98.1 F (36.7 C) (Oral)  Ht 5' 1.5" (1.562 m)  Wt 178 lb (80.74 kg)  BMI 33.09 kg/m2  SpO2 97% Physical Exam  VS noted Constitutional: Pt appears well-developed and well-nourished.  HENT: Head: Normocephalic.  Right Ear: External ear normal.  Left Ear: External ear normal.  Eyes: Conjunctivae and EOM are normal. Pupils are equal, round, and reactive to light.  Neck: Normal range of motion. Neck supple.  Cardiovascular: Normal rate and regular rhythm.   Pulmonary/Chest: Effort normal and breath sounds normal.  Neurological: Pt is alert. No cranial nerve deficit.  Skin: Skin is warm. No erythema.  Psychiatric: Pt behavior is normal. Thought content normal.     Assessment & Plan:

## 2011-07-17 ENCOUNTER — Encounter: Payer: Self-pay | Admitting: Internal Medicine

## 2011-07-17 NOTE — Assessment & Plan Note (Signed)
stable overall by hx and exam, most recent data reviewed with pt, and pt to continue medical treatment as before Lab Results  Component Value Date   HGBA1C 6.3 07/09/2011

## 2011-07-17 NOTE — Assessment & Plan Note (Signed)
stable overall by hx and exam, most recent data reviewed with pt, and pt to continue medical treatment as before BP Readings from Last 3 Encounters:  07/15/11 112/72  10/26/10 98/60  09/23/10 140/90

## 2011-07-17 NOTE — Assessment & Plan Note (Signed)
stable overall by hx and exam, most recent data reviewed with pt, and pt to continue medical treatment as before  Lab Results  Component Value Date   LDLCALC 87 07/09/2011

## 2011-07-17 NOTE — Assessment & Plan Note (Signed)
stable overall by hx and exam, most recent data reviewed with pt, and pt to continue medical treatment as before  Lab Results  Component Value Date   WBC 7.8 10/22/2010   HGB 12.6 10/22/2010   HCT 35.7* 10/22/2010   PLT 226.0 10/22/2010   GLUCOSE 95 07/09/2011   CHOL 172 07/09/2011   TRIG 166.0* 07/09/2011   HDL 51.50 07/09/2011   LDLDIRECT 56.4 07/05/2008   LDLCALC 87 07/09/2011   ALT 28 10/22/2010   AST 23 10/22/2010   NA 141 07/09/2011   K 3.7 07/09/2011   CL 103 07/09/2011   CREATININE 0.7 07/09/2011   BUN 15 07/09/2011   CO2 29 07/09/2011   TSH 0.91 10/22/2010   HGBA1C 6.3 07/09/2011

## 2011-08-18 ENCOUNTER — Encounter: Payer: Self-pay | Admitting: *Deleted

## 2011-08-20 ENCOUNTER — Ambulatory Visit (INDEPENDENT_AMBULATORY_CARE_PROVIDER_SITE_OTHER): Payer: 59 | Admitting: Internal Medicine

## 2011-08-20 ENCOUNTER — Encounter: Payer: Self-pay | Admitting: Internal Medicine

## 2011-08-20 ENCOUNTER — Other Ambulatory Visit: Payer: 59

## 2011-08-20 VITALS — BP 120/80 | HR 84 | Temp 98.1°F | Ht 62.0 in | Wt 178.0 lb

## 2011-08-20 DIAGNOSIS — E119 Type 2 diabetes mellitus without complications: Secondary | ICD-10-CM

## 2011-08-20 DIAGNOSIS — I10 Essential (primary) hypertension: Secondary | ICD-10-CM

## 2011-08-20 DIAGNOSIS — Z0000 Encounter for general adult medical examination without abnormal findings: Secondary | ICD-10-CM

## 2011-08-20 DIAGNOSIS — R300 Dysuria: Secondary | ICD-10-CM

## 2011-08-20 DIAGNOSIS — R350 Frequency of micturition: Secondary | ICD-10-CM

## 2011-08-20 LAB — POCT URINALYSIS DIPSTICK
Bilirubin, UA: NEGATIVE
Ketones, UA: NEGATIVE
Leukocytes, UA: NEGATIVE
Protein, UA: NEGATIVE
pH, UA: 5

## 2011-08-20 MED ORDER — FLUCONAZOLE 150 MG PO TABS: ORAL_TABLET | ORAL | Status: AC

## 2011-08-20 MED ORDER — AMOXICILLIN-POT CLAVULANATE 875-125 MG PO TABS: 1.0000 | ORAL_TABLET | Freq: Two times a day (BID) | ORAL | Status: AC

## 2011-08-20 NOTE — Patient Instructions (Addendum)
Your urine will be sent for culture Take all new medications as prescribed Please call the phone number [phone] (the [employer] System) for results of testing in 2-3 days;  When calling, simply dial the number, and when prompted enter the MRN number above (the Medical Record Number) and the # key, then the message should start. Please return in 3 mo with Lab testing done 3-5 days before (the office will call, ok to cancel the Jan 2013 visit)

## 2011-08-21 ENCOUNTER — Encounter: Payer: Self-pay | Admitting: Internal Medicine

## 2011-08-21 NOTE — Assessment & Plan Note (Signed)
stable overall by hx and exam, most recent data reviewed with pt, and pt to continue medical treatment as before Lab Results  Component Value Date   HGBA1C 6.3 07/09/2011    

## 2011-08-21 NOTE — Progress Notes (Signed)
Subjective:    Patient ID: [name] [name], female    DOB: Apr 25, 1956, 55 y.o.   MRN: [id number]  HPI  Here to f/u; overall doing ok,  Pt denies chest pain, increased sob or doe, wheezing, orthopnea, PND, increased LE swelling, palpitations, dizziness or syncope.  Pt denies new neurological symptoms such as new headache, or facial or extremity weakness or numbness   Pt denies polydipsia, polyuria, or low sugar symptoms such as weakness or confusion improved with po intake.  Pt states overall good compliance with meds, trying to follow lower cholesterol, diabetic diet, wt overall stable but little exercise however.   Has urinary symptoms of frequency, urgency, but no hematuria or dysuria x 3-4 days.  Has mild new low back discomfort and nausea, but no vomiting.  Last UTI approx 1 yr.   Past Medical History  Diagnosis Date  . ALLERGIC RHINITIS 04/06/2007  . COLONIC POLYPS, HX OF 07/04/2008  . COLONIC POLYPS 08/04/2007  . DIARRHEA, ACUTE 09/23/2010  . Dysmenorrhea 11/30/2007  . ENDOMETRIAL POLYP 01/11/2002  . ENDOMETRIOSIS 11/30/2007  . GASTRIC POLYP 08/04/2007  . GERD 08/04/2007  . Headache 11/13/2008  . HYPERLIPIDEMIA 04/06/2007  . ISCHEMIC COLITIS, HX OF 07/06/2007  . OTITIS MEDIA, LEFT 06/19/2009  . Palpitations 03/10/2009  . PYELONEPHRITIS, ACUTE 09/23/2010  . Rosacea 11/30/2007  . SINUSITIS- ACUTE-NOS 09/11/2008  . SKIN LESION 06/19/2009  . Urinary frequency 09/11/2008  . UTI'S, RECURRENT 11/30/2007  . VAGINITIS, CANDIDAL 11/26/2009  . ANXIETY 04/06/2007  . DIABETES MELLITUS, TYPE II 10/26/2010  . HYPERTENSION 04/06/2007  . GOITER, MULTINODULAR 03/10/2009  . THYROID NODULE, LEFT 11/04/2010   Past Surgical History  Procedure Date  . Dermoid cyst  excision 1994    right ovary  . Uterine fibroids removed 1996  . Right foot neuroma 1988  . Removed skull cyst 1973  . Flexible sigmoidoscopy 11/16/95  . Appendectomy 2005  . Bladder tack   . Cystoscopy 2007  . Lt. pectoral/subclavicular abscess  drainage 2006    reports that she has never smoked. She does not have any smokeless tobacco history on file. She reports that she drinks alcohol. She reports that she does not use illicit drugs. family history includes Allergies in her other; Asthma in her other; Breast cancer in her paternal aunt; COPD in her other; Cancer in her maternal grandmother and mother; Crohn's disease in her other; Diabetes in her mother; Heart disease in her father, mother, and other; Hypertension in her mother; and Lung cancer in her maternal grandmother and mother. Allergies  Allergen Reactions  . Atorvastatin     REACTION: extreme muscle aches  . Cefuroxime Axetil     REACTION: DIARRHEA  . Ciprofloxacin     REACTION: diarrhea  . Latex     REACTION: rash  . Lipitor (Atorvastatin Calcium) Other (See Comments)    sensitive  . Moxifloxacin     REACTION: DIARRHEA   Current Outpatient Prescriptions on File Prior to Visit  Medication Sig Dispense Refill  . aspirin 81 MG EC tablet Take 81 mg by mouth daily.        . cetirizine (ZYRTEC) 10 MG tablet Take 10 mg by mouth daily.        . cholecalciferol (VITAMIN D) 1000 UNITS tablet Take 1,000 Units by mouth daily.        Marland Kitchen guaiFENesin (MUCINEX) 600 MG 12 hr tablet Take 1,200 mg by mouth 2 (two) times daily.        . Multiple Vitamin (MULTIVITAMIN)  capsule Take 1 capsule by mouth daily.        Marland Kitchen omeprazole (PRILOSEC) 20 MG capsule Take 20 mg by mouth 2 (two) times daily.        Marland Kitchen oxymetazoline (AFRIN) 0.05 % nasal spray Place 2 sprays into the nose as needed.        . potassium chloride SA (KLOR-CON M20) 20 MEQ tablet Take 20 mEq by mouth 2 (two) times daily.        . rosuvastatin (CRESTOR) 20 MG tablet Take 20 mg by mouth daily.        . valsartan-hydrochlorothiazide (DIOVAN-HCT) 320-25 MG per tablet Take 1 tablet by mouth daily.         Review of Systems Review of Systems  Constitutional: Negative for diaphoresis and unexpected weight change.  HENT: Negative  for drooling and tinnitus.   Eyes: Negative for photophobia and visual disturbance.  Respiratory: Negative for choking and stridor.   Gastrointestinal: Negative for vomiting and blood in stool.   Musculoskeletal: Negative for gait problem.  Skin: Negative for color change and wound.  Objective:   Physical Exam BP 120/80  Pulse 84  Temp(Src) 98.1 F (36.7 C) (Oral)  Ht 5\' 2"  (1.575 m)  Wt 178 lb (80.74 kg)  BMI 32.56 kg/m2  SpO2 96% Physical Exam  VS noted, mild ill Constitutional: Pt appears well-developed and well-nourished.  HENT: Head: Normocephalic.  Right Ear: External ear normal.  Left Ear: External ear normal.  Eyes: Conjunctivae and EOM are normal. Pupils are equal, round, and reactive to light.  Neck: Normal range of motion. Neck supple.  Cardiovascular: Normal rate and regular rhythm.   Pulmonary/Chest: Effort normal and breath sounds normal.  Abd:  Soft, NT, non-distended, + BS except for mild low mid abd tender,  No flank tender or spine/paravertebral tender Neurological: Pt is alert. No cranial nerve deficit.  Skin: Skin is warm. No erythema.  Psychiatric: Pt behavior is normal. Thought content normal.     Assessment & Plan:

## 2011-08-21 NOTE — Assessment & Plan Note (Signed)
C/w UTI - Mild to mod, for antibx course,  to f/u any worsening symptoms or concerns, for urine cx as well

## 2011-08-21 NOTE — Assessment & Plan Note (Signed)
stable overall by hx and exam, most recent data reviewed with pt, and pt to continue medical treatment as before  BP Readings from Last 3 Encounters:  08/20/11 120/80  07/15/11 112/72  10/26/10 98/60

## 2011-08-22 LAB — URINE CULTURE

## 2011-08-25 ENCOUNTER — Ambulatory Visit (INDEPENDENT_AMBULATORY_CARE_PROVIDER_SITE_OTHER): Payer: 59 | Admitting: Obstetrics and Gynecology

## 2011-08-25 ENCOUNTER — Encounter: Payer: Self-pay | Admitting: Obstetrics and Gynecology

## 2011-08-25 ENCOUNTER — Other Ambulatory Visit (HOSPITAL_COMMUNITY)
Admission: RE | Admit: 2011-08-25 | Discharge: 2011-08-25 | Disposition: A | Discharge status: Home / Self Care | Payer: 59 | Source: Ambulatory Visit | Attending: Obstetrics and Gynecology | Admitting: Obstetrics and Gynecology

## 2011-08-25 VITALS — BP 122/78 | Ht 62.75 in | Wt 178.0 lb

## 2011-08-25 DIAGNOSIS — N870 Mild cervical dysplasia: Secondary | ICD-10-CM | POA: Insufficient documentation

## 2011-08-25 DIAGNOSIS — Z01419 Encounter for gynecological examination (general) (routine) without abnormal findings: Secondary | ICD-10-CM | POA: Insufficient documentation

## 2011-08-25 NOTE — Progress Notes (Signed)
Patient came to see me today for her annual GYN exam. She is not having menopausal symptoms. She is up-to-date on mammograms. She's had 2 normal bone densities. She does her lab through PCP. She's currently being treated for UTI with good results by her PCP. She is up-to-date on colonoscopies. She is having itching with her hemorrhoids but they're responding to steroid cream that is over-the-counter. She is having no rectal bleeding. She had several skin lesions she wanted me to look at. She is having no vaginal bleeding. She is having no pelvic pain.  Physical examination:  Kennon Portela present. HEENT within normal limits. Neck: Thyroid not large. No masses. Supraclavicular nodes: not enlarged. Breasts: Examined in both sitting midline position. No skin changes and no masses. Abdomen: Soft no guarding rebound or masses or hernia. Pelvic: External: Within normal limits. BUS: Within normal limits. Vaginal:within normal limits. Good estrogen effect. No evidence of cystocele rectocele or enterocele. Cervix: clean. Uterus: Normal size and shape. Adnexa: No masses. Rectovaginal exam: Confirmatory with small external hemorrhoid. Extremities: Within normal limits.  Skin:patient has 2 skin lesions on left inner thigh and back of left leg. Both are less than a centimeter. They are soft and feel like small lipomas.  Assessment: External hemorrhoid. Small lipomas on skin.  Plan: Continue steroid cream. If stops working will call us for a prescription strength steroid cream. Reassured about the skin lesions. She will also show them to her dermatologist.

## 2011-09-22 ENCOUNTER — Other Ambulatory Visit: Payer: Self-pay

## 2011-09-22 MED ORDER — VALSARTAN-HYDROCHLOROTHIAZIDE 320-25 MG PO TABS: 1.0000 | ORAL_TABLET | Freq: Every day | ORAL | Status: DC

## 2011-09-22 NOTE — Telephone Encounter (Signed)
Patient called to requested Diovan to be filled. She had a CPX scheduled in Jan. And the office called to change to Feb. And she will be out before that time. Filled as requested

## 2011-10-11 ENCOUNTER — Encounter: Payer: 59 | Admitting: Internal Medicine

## 2011-10-20 ENCOUNTER — Other Ambulatory Visit (INDEPENDENT_AMBULATORY_CARE_PROVIDER_SITE_OTHER): Payer: 59

## 2011-10-20 DIAGNOSIS — E119 Type 2 diabetes mellitus without complications: Secondary | ICD-10-CM

## 2011-10-20 DIAGNOSIS — Z0000 Encounter for general adult medical examination without abnormal findings: Secondary | ICD-10-CM

## 2011-10-20 LAB — URINALYSIS, ROUTINE W REFLEX MICROSCOPIC
Bilirubin Urine: NEGATIVE
Total Protein, Urine: NEGATIVE
Urine Glucose: NEGATIVE
Urobilinogen, UA: 0.2 (ref 0.0–1.0)

## 2011-10-20 LAB — TSH: TSH: 1.31 u[IU]/mL (ref 0.35–5.50)

## 2011-10-20 LAB — CBC WITH DIFFERENTIAL/PLATELET
Basophils Relative: 0.8 % (ref 0.0–3.0)
Eosinophils Relative: 3.9 % (ref 0.0–5.0)
HCT: 36.7 % (ref 36.0–46.0)
Lymphs Abs: 2.3 10*3/uL (ref 0.7–4.0)
MCV: 82 fl (ref 78.0–100.0)
Monocytes Absolute: 0.5 10*3/uL (ref 0.1–1.0)
Monocytes Relative: 7.6 % (ref 3.0–12.0)
Neutrophils Relative %: 55.3 % (ref 43.0–77.0)
RBC: 4.47 Mil/uL (ref 3.87–5.11)
WBC: 7.2 10*3/uL (ref 4.5–10.5)

## 2011-10-20 LAB — BASIC METABOLIC PANEL
BUN: 17 mg/dL (ref 6–23)
Calcium: 10.2 mg/dL (ref 8.4–10.5)
GFR: 77.83 mL/min (ref >60.00)
Glucose, Bld: 106 mg/dL — ABNORMAL HIGH (ref 70–99)
Potassium: 4 mEq/L (ref 3.5–5.1)

## 2011-10-20 LAB — HEPATIC FUNCTION PANEL
ALT: 32 U/L (ref 0–35)
AST: 25 U/L (ref 0–37)
Bilirubin, Direct: 0.1 mg/dL (ref 0.0–0.3)
Total Bilirubin: 0.5 mg/dL (ref 0.3–1.2)
Total Protein: 7.5 g/dL (ref 6.0–8.3)

## 2011-10-20 LAB — LIPID PANEL
Cholesterol: 180 mg/dL (ref 0–200)
VLDL: 42.4 mg/dL — ABNORMAL HIGH (ref 0.0–40.0)

## 2011-10-20 LAB — LDL CHOLESTEROL, DIRECT: Direct LDL: 103.2 mg/dL

## 2011-10-25 ENCOUNTER — Encounter: Payer: Self-pay | Admitting: Internal Medicine

## 2011-10-25 ENCOUNTER — Ambulatory Visit (INDEPENDENT_AMBULATORY_CARE_PROVIDER_SITE_OTHER): Payer: 59 | Admitting: Internal Medicine

## 2011-10-25 VITALS — BP 120/80 | HR 76 | Temp 99.0°F | Ht 62.0 in | Wt 179.1 lb

## 2011-10-25 DIAGNOSIS — Z0000 Encounter for general adult medical examination without abnormal findings: Secondary | ICD-10-CM

## 2011-10-25 DIAGNOSIS — E042 Nontoxic multinodular goiter: Secondary | ICD-10-CM

## 2011-10-25 DIAGNOSIS — E119 Type 2 diabetes mellitus without complications: Secondary | ICD-10-CM

## 2011-10-25 MED ORDER — VALSARTAN-HYDROCHLOROTHIAZIDE 320-25 MG PO TABS: 1.0000 | ORAL_TABLET | Freq: Every day | ORAL | Status: DC

## 2011-10-25 MED ORDER — POTASSIUM CHLORIDE CRYS ER 20 MEQ PO TBCR: 20.0000 meq | EXTENDED_RELEASE_TABLET | Freq: Two times a day (BID) | ORAL | Status: DC

## 2011-10-25 MED ORDER — ROSUVASTATIN CALCIUM 20 MG PO TABS: 20.0000 mg | ORAL_TABLET | Freq: Every day | ORAL | Status: DC

## 2011-10-25 NOTE — Patient Instructions (Addendum)
Your EKG was good today You are given the hardcopy of your current labs and 2012 thyroid ultrasound Continue all other medications as before Your refills were sent to the pharmacy as requested You are otherwise up to date with prevention You will be contacted regarding the referral for: thyroid ultrasound Please continue to focus on better diet, exercise and weight control to help the sugar and cholesterol Please return in 1 year for your yearly visit, or sooner if needed, with Lab testing done 3-5 days before

## 2011-10-25 NOTE — Assessment & Plan Note (Signed)

## 2011-10-28 ENCOUNTER — Other Ambulatory Visit: Payer: 59

## 2011-10-31 ENCOUNTER — Encounter: Payer: Self-pay | Admitting: Internal Medicine

## 2011-10-31 NOTE — Assessment & Plan Note (Signed)
stable overall by hx and exam, most recent data reviewed with pt, and pt to continue medical treatment as before  Lab Results  Component Value Date   HGBA1C 6.4 10/20/2011

## 2011-10-31 NOTE — Assessment & Plan Note (Signed)
Ok for thyroid u/s f/u to assess for change

## 2011-10-31 NOTE — Progress Notes (Signed)
Subjective:    Patient ID: [name] [name], female    DOB: 1955-10-21, 56 y.o.   MRN: [id number]  HPI  Here for wellness and f/u;  Overall doing ok;  Pt denies CP, worsening SOB, DOE, wheezing, orthopnea, PND, worsening LE edema, palpitations, dizziness or syncope.  Pt denies neurological change such as new Headache, facial or extremity weakness.  Pt denies polydipsia, polyuria, or low sugar symptoms. Pt states overall good compliance with treatment and medications, good tolerability, and trying to follow lower cholesterol diet.  Pt denies worsening depressive symptoms, suicidal ideation or panic. No fever, wt loss, night sweats, loss of appetite, or other constitutional symptoms.  Pt states good ability with ADL's, low fall risk, home safety reviewed and adequate, no significant changes in hearing or vision, and occasionally active with exercise, though has overall done better in the past yr with diet. Past Medical History  Diagnosis Date  . ALLERGIC RHINITIS 04/06/2007  . COLONIC POLYPS, HX OF 07/04/2008  . COLONIC POLYPS 08/04/2007  . DIARRHEA, ACUTE 09/23/2010  . Dysmenorrhea 11/30/2007  . ENDOMETRIAL POLYP 01/11/2002  . ENDOMETRIOSIS 11/30/2007  . GASTRIC POLYP 08/04/2007  . GERD 08/04/2007  . Headache 11/13/2008  . HYPERLIPIDEMIA 04/06/2007  . ISCHEMIC COLITIS, HX OF 07/06/2007  . OTITIS MEDIA, LEFT 06/19/2009  . Palpitations 03/10/2009  . PYELONEPHRITIS, ACUTE 09/23/2010  . Rosacea 11/30/2007  . SINUSITIS- ACUTE-NOS 09/11/2008  . SKIN LESION 06/19/2009  . Urinary frequency 09/11/2008  . UTI'S, RECURRENT 11/30/2007  . VAGINITIS, CANDIDAL 11/26/2009  . ANXIETY 04/06/2007  . HYPERTENSION 04/06/2007  . GOITER, MULTINODULAR 03/10/2009  . THYROID NODULE, LEFT 11/04/2010  . CIN I (cervical intraepithelial neoplasia I)   . Fibroid    Past Surgical History  Procedure Date  . Dermoid cyst  excision 1994    right ovary  . Uterine fibroids removed 1996  . Right foot neuroma 1988  . Removed skull cyst  1973  . Flexible sigmoidoscopy 11/16/95  . Appendectomy 2005  . Bladder tack   . Cystoscopy 2007  . Lt. pectoral/subclavicular abscess drainage 2006  . Colposcopy     reports that she has never smoked. She does not have any smokeless tobacco history on file. She reports that she drinks alcohol. She reports that she does not use illicit drugs. family history includes Breast cancer in her paternal aunt; Cancer in her maternal grandmother and mother; Crohn's disease in her other; Diabetes in her mother; Heart disease in her father and mother; Hypertension in her mother; Lung cancer in her maternal grandmother and mother; and Stroke in her father. Allergies  Allergen Reactions  . Atorvastatin     REACTION: extreme muscle aches  . Cefuroxime Axetil     REACTION: DIARRHEA  . Ciprofloxacin     REACTION: diarrhea  . Latex     REACTION: rash  . Lipitor (Atorvastatin Calcium) Other (See Comments)    sensitive  . Moxifloxacin     REACTION: DIARRHEA   Current Outpatient Prescriptions on File Prior to Visit  Medication Sig Dispense Refill  . aspirin 81 MG EC tablet Take 81 mg by mouth daily.        . cetirizine (ZYRTEC) 10 MG tablet Take 10 mg by mouth daily.        . cholecalciferol (VITAMIN D) 1000 UNITS tablet Take 1,000 Units by mouth daily.        Marland Kitchen guaiFENesin (MUCINEX) 600 MG 12 hr tablet Take 1,200 mg by mouth 2 (two) times daily.        Marland Kitchen  Multiple Vitamin (MULTIVITAMIN) capsule Take 1 capsule by mouth daily.        Marland Kitchen omeprazole (PRILOSEC) 20 MG capsule Take 20 mg by mouth 2 (two) times daily.        Marland Kitchen oxymetazoline (AFRIN) 0.05 % nasal spray Place 2 sprays into the nose as needed.         Review of Systems Review of Systems  Constitutional: Negative for diaphoresis, activity change, appetite change and unexpected weight change.  HENT: Negative for hearing loss, ear pain, facial swelling, mouth sores and neck stiffness.   Eyes: Negative for pain, redness and visual disturbance.    Respiratory: Negative for shortness of breath and wheezing.   Cardiovascular: Negative for chest pain and palpitations.  Gastrointestinal: Negative for diarrhea, blood in stool, abdominal distention and rectal pain.  Genitourinary: Negative for hematuria, flank pain and decreased urine volume.  Musculoskeletal: Negative for myalgias and joint swelling.  Skin: Negative for color change and wound.  Neurological: Negative for syncope and numbness.  Hematological: Negative for adenopathy.  Psychiatric/Behavioral: Negative for hallucinations, self-injury, decreased concentration and agitation.      Objective:   Physical Exam BP 120/80  Pulse 76  Temp(Src) 99 F (37.2 C) (Oral)  Ht 5\' 2"  (1.575 m)  Wt 179 lb 2 oz (81.251 kg)  BMI 32.76 kg/m2  SpO2 94% Physical Exam  VS noted Constitutional: Pt is oriented to person, place, and time. Appears well-developed and well-nourished.  HENT:  Head: Normocephalic and atraumatic.  Right Ear: External ear normal.  Left Ear: External ear normal.  Nose: Nose normal.  Mouth/Throat: Oropharynx is clear and moist.  Eyes: Conjunctivae and EOM are normal. Pupils are equal, round, and reactive to light.  Neck: Normal range of motion. Neck supple. No JVD present. No tracheal deviation present.  Cardiovascular: Normal rate, regular rhythm, normal heart sounds and intact distal pulses.   Pulmonary/Chest: Effort normal and breath sounds normal.  Abdominal: Soft. Bowel sounds are normal. There is no tenderness.  Musculoskeletal: Normal range of motion. Exhibits no edema.  Lymphadenopathy:  Has no cervical adenopathy.  Neurological: Pt is alert and oriented to person, place, and time. Pt has normal reflexes. No cranial nerve deficit.  Skin: Skin is warm and dry. No rash noted.  Psychiatric:  Has  normal mood and affect. Behavior is normal.     Assessment & Plan:

## 2011-11-02 ENCOUNTER — Ambulatory Visit
Admission: RE | Admit: 2011-11-02 | Discharge: 2011-11-02 | Disposition: A | Discharge status: Home / Self Care | Payer: 59 | Source: Ambulatory Visit | Attending: Internal Medicine | Admitting: Internal Medicine

## 2011-11-02 DIAGNOSIS — E042 Nontoxic multinodular goiter: Secondary | ICD-10-CM

## 2012-03-03 ENCOUNTER — Other Ambulatory Visit: Payer: Self-pay | Admitting: Obstetrics and Gynecology

## 2012-03-03 DIAGNOSIS — Z1231 Encounter for screening mammogram for malignant neoplasm of breast: Secondary | ICD-10-CM

## 2012-03-15 ENCOUNTER — Ambulatory Visit: Payer: 59 | Admitting: Internal Medicine

## 2012-03-16 ENCOUNTER — Ambulatory Visit: Payer: 59

## 2012-04-04 ENCOUNTER — Ambulatory Visit
Admission: RE | Admit: 2012-04-04 | Discharge: 2012-04-04 | Disposition: A | Discharge status: Home / Self Care | Payer: 59 | Source: Ambulatory Visit | Attending: Obstetrics and Gynecology | Admitting: Obstetrics and Gynecology

## 2012-04-04 DIAGNOSIS — Z1231 Encounter for screening mammogram for malignant neoplasm of breast: Secondary | ICD-10-CM

## 2012-05-16 ENCOUNTER — Other Ambulatory Visit (INDEPENDENT_AMBULATORY_CARE_PROVIDER_SITE_OTHER): Payer: 59

## 2012-05-16 ENCOUNTER — Encounter: Payer: Self-pay | Admitting: Internal Medicine

## 2012-05-16 ENCOUNTER — Ambulatory Visit (INDEPENDENT_AMBULATORY_CARE_PROVIDER_SITE_OTHER): Payer: 59 | Admitting: Internal Medicine

## 2012-05-16 ENCOUNTER — Ambulatory Visit (INDEPENDENT_AMBULATORY_CARE_PROVIDER_SITE_OTHER)
Admission: RE | Admit: 2012-05-16 | Discharge: 2012-05-16 | Disposition: A | Discharge status: Home / Self Care | Payer: 59 | Source: Ambulatory Visit | Attending: Internal Medicine | Admitting: Internal Medicine

## 2012-05-16 VITALS — BP 92/62 | HR 82 | Temp 98.8°F | Ht 62.0 in | Wt 172.0 lb

## 2012-05-16 DIAGNOSIS — R3915 Urgency of urination: Secondary | ICD-10-CM

## 2012-05-16 DIAGNOSIS — R062 Wheezing: Secondary | ICD-10-CM

## 2012-05-16 DIAGNOSIS — J209 Acute bronchitis, unspecified: Secondary | ICD-10-CM

## 2012-05-16 LAB — URINALYSIS, ROUTINE W REFLEX MICROSCOPIC
Bilirubin Urine: NEGATIVE
Ketones, ur: NEGATIVE
Leukocytes, UA: NEGATIVE
Urobilinogen, UA: 0.2 (ref 0.0–1.0)
pH: 7 (ref 5.0–8.0)

## 2012-05-16 MED ORDER — PREDNISONE 10 MG PO TABS: ORAL_TABLET | ORAL | Status: DC

## 2012-05-16 MED ORDER — HYDROCODONE-HOMATROPINE 5-1.5 MG/5ML PO SYRP: 5.0000 mL | ORAL_SOLUTION | Freq: Four times a day (QID) | ORAL | Status: AC | PRN

## 2012-05-16 NOTE — Progress Notes (Signed)
Subjective:    Patient ID: [name] [name], female    DOB: 1956-08-24, 56 y.o.   MRN: [id number]  HPI  Here to f/u, after 2 episode care recently at [facility] for acute bronchits initially tx with antibx and high dose predpack taper, then subsequent change of antibx + low dose predpack taper and inhaler after onset worsenig wheezing;  Overall improved but still with signficant cough, chest tightness, hard to take deep breath, anterior pleuritic CP, ? Persistent low grace temp, signficant fatigue.  Pt denies increased sob or doe, orthopnea, PND, increased LE swelling, palpitations, dizziness or syncope. Pt denies new neurological symptoms such as new headache, or facial or extremity weakness or numbness   Pt denies polydipsia, polyuria.   Pt denies wt loss, night sweats, loss of appetite, or other constitutional symptoms.  Also tx for possible UTI improved, but still some urinary urgency and asks for UA f/u to make sure is cleared. Past Medical History  Diagnosis Date  . ALLERGIC RHINITIS 04/06/2007  . COLONIC POLYPS, HX OF 07/04/2008  . COLONIC POLYPS 08/04/2007  . DIARRHEA, ACUTE 09/23/2010  . Dysmenorrhea 11/30/2007  . ENDOMETRIAL POLYP 01/11/2002  . ENDOMETRIOSIS 11/30/2007  . GASTRIC POLYP 08/04/2007  . GERD 08/04/2007  . Headache 11/13/2008  . HYPERLIPIDEMIA 04/06/2007  . ISCHEMIC COLITIS, HX OF 07/06/2007  . OTITIS MEDIA, LEFT 06/19/2009  . Palpitations 03/10/2009  . PYELONEPHRITIS, ACUTE 09/23/2010  . Rosacea 11/30/2007  . SINUSITIS- ACUTE-NOS 09/11/2008  . SKIN LESION 06/19/2009  . Urinary frequency 09/11/2008  . UTI'S, RECURRENT 11/30/2007  . VAGINITIS, CANDIDAL 11/26/2009  . ANXIETY 04/06/2007  . HYPERTENSION 04/06/2007  . GOITER, MULTINODULAR 03/10/2009  . THYROID NODULE, LEFT 11/04/2010  . CIN I (cervical intraepithelial neoplasia I)   . Fibroid    Past Surgical History  Procedure Date  . Dermoid cyst  excision 1994    right ovary  . Uterine fibroids removed 1996  . Right foot neuroma 1988    . Removed skull cyst 1973  . Flexible sigmoidoscopy 11/16/95  . Appendectomy 2005  . Bladder tack   . Cystoscopy 2007  . Lt. pectoral/subclavicular abscess drainage 2006  . Colposcopy     reports that she has never smoked. She does not have any smokeless tobacco history on file. She reports that she drinks alcohol. She reports that she does not use illicit drugs. family history includes Breast cancer in her paternal aunt; Cancer in her maternal grandmother and mother; Crohn's disease in her other; Diabetes in her mother; Heart disease in her father and mother; Hypertension in her mother; Lung cancer in her maternal grandmother and mother; and Stroke in her father. Allergies  Allergen Reactions  . Atorvastatin     REACTION: extreme muscle aches  . Cefuroxime Axetil     REACTION: DIARRHEA  . Ciprofloxacin     REACTION: diarrhea  . Latex     REACTION: rash  . Lipitor (Atorvastatin Calcium) Other (See Comments)    sensitive  . Moxifloxacin     REACTION: DIARRHEA   Current Outpatient Prescriptions on File Prior to Visit  Medication Sig Dispense Refill  . albuterol (VENTOLIN HFA) 108 (90 BASE) MCG/ACT inhaler Inhale 2 puffs into the lungs every 6 (six) hours as needed.      Marland Kitchen aspirin 81 MG EC tablet Take 81 mg by mouth daily.        . cetirizine (ZYRTEC) 10 MG tablet Take 10 mg by mouth daily.        Marland Kitchen  cholecalciferol (VITAMIN D) 1000 UNITS tablet Take 1,000 Units by mouth daily.        Marland Kitchen guaiFENesin (MUCINEX) 600 MG 12 hr tablet Take 1,200 mg by mouth 2 (two) times daily.        . Multiple Vitamin (MULTIVITAMIN) capsule Take 1 capsule by mouth daily.        Marland Kitchen omeprazole (PRILOSEC) 20 MG capsule Take 20 mg by mouth 2 (two) times daily.        Marland Kitchen oxymetazoline (AFRIN) 0.05 % nasal spray Place 2 sprays into the nose as needed.        . potassium chloride SA (KLOR-CON M20) 20 MEQ tablet Take 1 tablet (20 mEq total) by mouth 2 (two) times daily.  180 tablet  3  . rosuvastatin (CRESTOR) 20  MG tablet Take 1 tablet (20 mg total) by mouth daily.  90 tablet  3  . valsartan-hydrochlorothiazide (DIOVAN-HCT) 320-25 MG per tablet Take 1 tablet by mouth daily.  90 tablet  3   Review of Systems Review of Systems  Constitutional: Negative for diaphoresis and unexpected weight change.  HENT: Negative for tinnitus.   Eyes: Negative for photophobia and visual disturbance.  Respiratory: Negative for choking and stridor.   Gastrointestinal: Negative for vomiting and blood in stool.  Genitourinary: Negative for hematuria and decreased urine volume.  Musculoskeletal: Negative for gait problem.  Skin: Negative for color change and wound.  Neurological: Negative for tremors and numbness.  Objective:   Physical Exam BP 92/62  Pulse 82  Temp 98.8 F (37.1 C) (Oral)  Ht 5\' 2"  (1.575 m)  Wt 172 lb (78.019 kg)  BMI 31.46 kg/m2  SpO2 94% Physical Exam  VS noted, mild ill Constitutional: Pt appears well-developed and well-nourished.  HENT: Head: Normocephalic.  Right Ear: External ear normal.  Left Ear: External ear normal.  Bilat tm's mild erythema.  Sinus nontender.  Pharynx mild erythema Eyes: Conjunctivae and EOM are normal. Pupils are equal, round, and reactive to light.  Neck: Normal range of motion. Neck supple. with bilat submandib LA tender Cardiovascular: Normal rate and regular rhythm.   Pulmonary/Chest: Effort normal and breath sounds decreased, trace wheeze bilat Abd: soft, NT, +BS Neurological: Pt is alert. Not confused.  Skin: Skin is warm. No erythema. No rash Psychiatric: Pt behavior is normal. Thought content normal. 1+ nervous    Assessment & Plan:

## 2012-05-16 NOTE — Assessment & Plan Note (Signed)
Also for UA, exam benign today but c/o occasional urgency

## 2012-05-16 NOTE — Assessment & Plan Note (Signed)
S/p 2 rounds antibx, ? Viral - for cxr, o/w follow for any worsening fever, prod cough, sob ;  Further antibx if cxr abnormal

## 2012-05-16 NOTE — Patient Instructions (Addendum)
Take all new medications as prescribed - the lower dose prednisone (sent to [facility]), and the cough med (gave in hardcopy) Please go to LAB in the Basement for the urine tests to be done today Please go to XRAY in the Basement for the x-ray test You will be contacted by phone if any changes need to be made immediately.  Otherwise, you will receive a letter about your results with an explanation. Continue all other medications as before Please have the pharmacy call with any refills you may need.

## 2012-05-16 NOTE — Assessment & Plan Note (Signed)
Mild persistent, slow to clear, for repeat low dose predpack, cont inhaler prn

## 2012-07-31 ENCOUNTER — Encounter: Payer: Self-pay | Admitting: Internal Medicine

## 2012-07-31 ENCOUNTER — Other Ambulatory Visit (INDEPENDENT_AMBULATORY_CARE_PROVIDER_SITE_OTHER): Payer: 59

## 2012-07-31 ENCOUNTER — Ambulatory Visit (INDEPENDENT_AMBULATORY_CARE_PROVIDER_SITE_OTHER): Payer: 59 | Admitting: Internal Medicine

## 2012-07-31 VITALS — BP 102/62 | HR 82 | Temp 97.7°F | Ht 62.0 in | Wt 175.0 lb

## 2012-07-31 DIAGNOSIS — B999 Unspecified infectious disease: Secondary | ICD-10-CM

## 2012-07-31 DIAGNOSIS — E119 Type 2 diabetes mellitus without complications: Secondary | ICD-10-CM

## 2012-07-31 DIAGNOSIS — B379 Candidiasis, unspecified: Secondary | ICD-10-CM

## 2012-07-31 DIAGNOSIS — N390 Urinary tract infection, site not specified: Secondary | ICD-10-CM

## 2012-07-31 DIAGNOSIS — Z0000 Encounter for general adult medical examination without abnormal findings: Secondary | ICD-10-CM

## 2012-07-31 DIAGNOSIS — B49 Unspecified mycosis: Secondary | ICD-10-CM

## 2012-07-31 LAB — URINALYSIS, ROUTINE W REFLEX MICROSCOPIC
Nitrite: NEGATIVE
Specific Gravity, Urine: <=1.005 (ref 1.000–1.030)
Urine Glucose: NEGATIVE
Urobilinogen, UA: 0.2 (ref 0.0–1.0)

## 2012-07-31 LAB — LIPID PANEL
Cholesterol: 140 mg/dL (ref 0–200)
HDL: 45.4 mg/dL (ref >39.00)
Triglycerides: 334 mg/dL — ABNORMAL HIGH (ref 0.0–149.0)
VLDL: 66.8 mg/dL — ABNORMAL HIGH (ref 0.0–40.0)

## 2012-07-31 LAB — BASIC METABOLIC PANEL
Calcium: 9.4 mg/dL (ref 8.4–10.5)
GFR: 83.54 mL/min (ref >60.00)
Glucose, Bld: 128 mg/dL — ABNORMAL HIGH (ref 70–99)
Potassium: 3.2 mEq/L — ABNORMAL LOW (ref 3.5–5.1)
Sodium: 138 mEq/L (ref 135–145)

## 2012-07-31 LAB — POCT URINALYSIS DIPSTICK
Bilirubin, UA: NEGATIVE
Ketones, UA: NEGATIVE
pH, UA: 6

## 2012-07-31 LAB — HEPATIC FUNCTION PANEL
ALT: 25 U/L (ref 0–35)
Albumin: 4.2 g/dL (ref 3.5–5.2)
Total Bilirubin: 0.4 mg/dL (ref 0.3–1.2)

## 2012-07-31 LAB — CBC WITH DIFFERENTIAL/PLATELET
Basophils Absolute: 0.1 10*3/uL (ref 0.0–0.1)
HCT: 38.1 % (ref 36.0–46.0)
Lymphs Abs: 3.6 10*3/uL (ref 0.7–4.0)
MCHC: 33.1 g/dL (ref 30.0–36.0)
MCV: 83.1 fl (ref 78.0–100.0)
Monocytes Absolute: 0.7 10*3/uL (ref 0.1–1.0)
Platelets: 237 10*3/uL (ref 150.0–400.0)
RDW: 13.7 % (ref 11.5–14.6)

## 2012-07-31 LAB — TSH: TSH: 0.77 u[IU]/mL (ref 0.35–5.50)

## 2012-07-31 MED ORDER — FLUCONAZOLE 150 MG PO TABS: 150.0000 mg | ORAL_TABLET | Freq: Once | ORAL | Status: DC

## 2012-07-31 MED ORDER — NITROFURANTOIN MONOHYD MACRO 100 MG PO CAPS: 100.0000 mg | ORAL_CAPSULE | Freq: Two times a day (BID) | ORAL | Status: DC

## 2012-07-31 NOTE — Progress Notes (Signed)
Subjective:    Patient ID: [name] [name], female    DOB: [birth year], 56 y.o.   MRN: [id number]  HPI  Pt presents to the clinic today with c/o of UTI symptoms x 4 days. She has expierienced urgency, frequency and this morning she had pain with urination. She denies nausea, vomiting, chills or back pain.  She has also c/o vaginal yeast infections in which she has taken diflucan for, which helps while she's on the medication but reoccurs after she stops taking it. She has tried OTC metronidazole without out any relief. She describes thick beige discharge and her vagina is very itchy on the inside and out. It itch's constantly and the only relief she gets is whit a cool compress. Review of Systems      Past Medical History  Diagnosis Date  . ALLERGIC RHINITIS 04/06/2007  . COLONIC POLYPS, HX OF 07/04/2008  . COLONIC POLYPS 08/04/2007  . DIARRHEA, ACUTE 09/23/2010  . Dysmenorrhea 11/30/2007  . ENDOMETRIAL POLYP 01/11/2002  . ENDOMETRIOSIS 11/30/2007  . GASTRIC POLYP 08/04/2007  . GERD 08/04/2007  . Headache 11/13/2008  . HYPERLIPIDEMIA 04/06/2007  . ISCHEMIC COLITIS, HX OF 07/06/2007  . OTITIS MEDIA, LEFT 06/19/2009  . Palpitations 03/10/2009  . PYELONEPHRITIS, ACUTE 09/23/2010  . Rosacea 11/30/2007  . SINUSITIS- ACUTE-NOS 09/11/2008  . SKIN LESION 06/19/2009  . Urinary frequency 09/11/2008  . UTI'S, RECURRENT 11/30/2007  . VAGINITIS, CANDIDAL 11/26/2009  . ANXIETY 04/06/2007  . HYPERTENSION 04/06/2007  . GOITER, MULTINODULAR 03/10/2009  . THYROID NODULE, LEFT 11/04/2010  . CIN I (cervical intraepithelial neoplasia I)   . Fibroid     Current Outpatient Prescriptions  Medication Sig Dispense Refill  . albuterol (VENTOLIN HFA) 108 (90 BASE) MCG/ACT inhaler Inhale 2 puffs into the lungs every 6 (six) hours as needed.      Marland Kitchen aspirin 81 MG EC tablet Take 81 mg by mouth daily.        . cetirizine (ZYRTEC) 10 MG tablet Take 10 mg by mouth daily.        . cholecalciferol (VITAMIN D) 1000 UNITS tablet  Take 1,000 Units by mouth daily.        Marland Kitchen guaiFENesin (MUCINEX) 600 MG 12 hr tablet Take 1,200 mg by mouth 2 (two) times daily.        . Multiple Vitamin (MULTIVITAMIN) capsule Take 1 capsule by mouth daily.        Marland Kitchen omeprazole (PRILOSEC) 20 MG capsule Take 20 mg by mouth 2 (two) times daily.        Marland Kitchen oxymetazoline (AFRIN) 0.05 % nasal spray Place 2 sprays into the nose as needed.        . potassium chloride SA (KLOR-CON M20) 20 MEQ tablet Take 1 tablet (20 mEq total) by mouth 2 (two) times daily.  180 tablet  3  . predniSONE (DELTASONE) 10 MG tablet 2 tabs by mouth per day for 7 days  10 tablet  0  . rosuvastatin (CRESTOR) 20 MG tablet Take 1 tablet (20 mg total) by mouth daily.  90 tablet  3  . valsartan-hydrochlorothiazide (DIOVAN-HCT) 320-25 MG per tablet Take 1 tablet by mouth daily.  90 tablet  3    Allergies  Allergen Reactions  . Atorvastatin     REACTION: extreme muscle aches  . Cefuroxime Axetil     REACTION: DIARRHEA  . Ciprofloxacin     REACTION: diarrhea  . Latex     REACTION: rash  . Lipitor (Atorvastatin Calcium) Other (See Comments)  sensitive  . Moxifloxacin     REACTION: DIARRHEA    Family History  Problem Relation Age of Onset  . Cancer Mother     lung cancer  . Diabetes Mother   . Hypertension Mother   . Heart disease Mother   . Lung cancer Mother   . Cancer Maternal Grandmother     lung cancer  . Lung cancer Maternal Grandmother   . Crohn's disease Other   . Heart disease Father   . Stroke Father   . Breast cancer Paternal Aunt     History   Social History  . Marital Status: Single    Spouse Name: N/A    Number of Children: N/A  . Years of Education: N/A   Occupational History  . Not on file.   Social History Main Topics  . Smoking status: Never Smoker   . Smokeless tobacco: Not on file     Comment: passive tobacco smoke exposure as a child  . Alcohol Use: Yes     Comment: yes  . Drug Use: No  . Sexually Active: Yes    Birth  Control/ Protection: Post-menopausal   Other Topics Concern  . Not on file   Social History Narrative  . No narrative on file     Constitutional: Denies fever, malaise, fatigue, headache or abrupt weight changes.  Respiratory: Denies difficulty breathing, shortness of breath, cough or sputum production.   Cardiovascular: Denies chest pain, chest tightness, palpitations or swelling in the hands or feet.  Gastrointestinal: Denies abdominal pain, bloating, constipation, diarrhea or blood in the stool.  GU: Denies urgency, frequency, pain with urination, burning sensation, blood in urine, odor or discharge. Neurological: Denies dizziness, difficulty with memory, difficulty with speech or problems with balance and coordination.   No other specific complaints in a complete review of systems (except as listed in HPI above).  Objective:   Physical Exam  There were no vitals taken for this visit. Wt Readings from Last 3 Encounters:  05/16/12 172 lb (78.019 kg)  10/25/11 179 lb 2 oz (81.251 kg)  08/25/11 178 lb (80.74 kg)    General: Appears their stated age, well developed, well nourished in NAD. Cardiovascular: Normal rate and rhythm. S1,S2 noted.  No murmur, rubs or gallops noted. No JVD or BLE edema. No carotid bruits noted. Pulmonary/Chest: Normal effort and positive vesicular breath sounds. No respiratory distress. No wheezes, rales or ronchi noted.  Abdomen: Soft and nontender. Normal bowel sounds, no bruits noted. No distention or masses noted. Liver, spleen and kidneys non palpable. No CVA tenderness noted. Mild tenderness noted over palpation of the bladder.     Assessment & Plan:   Urinary Tract Infection  Obtain urinalysis and send urine culture Drink plenty of fluids Take Macrobid x 5 days  Recurrent Yeast infection  Diflucan 150 mg x one Check hgba1c  RTC as needed

## 2012-07-31 NOTE — Patient Instructions (Addendum)
Urinary Tract Infection Urinary tract infections (UTIs) can develop anywhere along your urinary tract. Your urinary tract is your body's drainage system for removing wastes and extra water. Your urinary tract includes two kidneys, two ureters, a bladder, and a urethra. Your kidneys are a pair of bean-shaped organs. Each kidney is about the size of your fist. They are located below your ribs, one on each side of your spine. CAUSES Infections are caused by microbes, which are microscopic organisms, including fungi, viruses, and bacteria. These organisms are so small that they can only be seen through a microscope. Bacteria are the microbes that most commonly cause UTIs. SYMPTOMS  Symptoms of UTIs may vary by age and gender of the patient and by the location of the infection. Symptoms in young women typically include a frequent and intense urge to urinate and a painful, burning feeling in the bladder or urethra during urination. Older women and men are more likely to be tired, shaky, and weak and have muscle aches and abdominal pain. A fever may mean the infection is in your kidneys. Other symptoms of a kidney infection include pain in your back or sides below the ribs, nausea, and vomiting. DIAGNOSIS To diagnose a UTI, your caregiver will ask you about your symptoms. Your caregiver also will ask to provide a urine sample. The urine sample will be tested for bacteria and white blood cells. White blood cells are made by your body to help fight infection. TREATMENT  Typically, UTIs can be treated with medication. Because most UTIs are caused by a bacterial infection, they usually can be treated with the use of antibiotics. The choice of antibiotic and length of treatment depend on your symptoms and the type of bacteria causing your infection. HOME CARE INSTRUCTIONS  If you were prescribed antibiotics, take them exactly as your caregiver instructs you. Finish the medication even if you feel better after you  have only taken some of the medication.  Drink enough water and fluids to keep your urine clear or pale yellow.  Avoid caffeine, tea, and carbonated beverages. They tend to irritate your bladder.  Empty your bladder often. Avoid holding urine for long periods of time.  Empty your bladder before and after sexual intercourse.  After a bowel movement, women should cleanse from front to back. Use each tissue only once. SEEK MEDICAL CARE IF:   You have back pain.  You develop a fever.  Your symptoms do not begin to resolve within 3 days. SEEK IMMEDIATE MEDICAL CARE IF:   You have severe back pain or lower abdominal pain.  You develop chills.  You have nausea or vomiting.  You have continued burning or discomfort with urination. MAKE SURE YOU:   Understand these instructions.  Will watch your condition.  Will get help right away if you are not doing well or get worse. Document Released: 06/16/2005 Document Revised: 03/07/2012 Document Reviewed: 10/15/2011 [employer] Patient Information 2013 [employer]. Candida Infection, Adult A candida infection (also called yeast, fungus and Monilia infection) is an overgrowth of yeast that can occur anywhere on the body. A yeast infection commonly occurs in warm, moist body areas. Usually, the infection remains localized but can spread to become a systemic infection. A yeast infection may be a sign of a more severe disease such as diabetes, leukemia, or AIDS. A yeast infection can occur in both men and women. In women, Candida vaginitis is a vaginal infection. It is one of the most common causes of vaginitis. Men usually   do not have symptoms or know they have an infection until other problems develop. Men may find out they have a yeast infection because their sex partner has a yeast infection. Uncircumcised men are more likely to get a yeast infection than circumcised men. This is because the uncircumcised glans is not exposed to air and does  not remain as dry as that of a circumcised glans. Older adults may develop yeast infections around dentures. CAUSES  Women  Antibiotics.  Steroid medication taken for a long time.  Being overweight (obese).  Diabetes.  Poor immune condition.  Certain serious medical conditions.  Immune suppressive medications for organ transplant patients.  Chemotherapy.  Pregnancy.  Menstration.  Stress and fatigue.  Intravenous drug use.  Oral contraceptives.  Wearing tight-fitting clothes in the crotch area.  Catching it from a sex partner who has a yeast infection.  Spermicide.  Intravenous, urinary, or other catheters. Men  Catching it from a sex partner who has a yeast infection.  Having oral or anal sex with a person who has the infection.  Spermicide.  Diabetes.  Antibiotics.  Poor immune system.  Medications that suppress the immune system.  Intravenous drug use.  Intravenous, urinary, or other catheters. SYMPTOMS  Women  Thick, white vaginal discharge.  Vaginal itching.  Redness and swelling in and around the vagina.  Irritation of the lips of the vagina and perineum.  Blisters on the vaginal lips and perineum.  Painful sexual intercourse.  Low blood sugar (hypoglycemia).  Painful urination.  Bladder infections.  Intestinal problems such as constipation, indigestion, bad breath, bloating, increase in gas, diarrhea, or loose stools. Men  Men may develop intestinal problems such as constipation, indigestion, bad breath, bloating, increase in gas, diarrhea, or loose stools.  Dry, cracked skin on the penis with itching or discomfort.  Jock itch.  Dry, flaky skin.  Athlete's foot.  Hypoglycemia. DIAGNOSIS  Women  A history and an exam are performed.  The discharge may be examined under a microscope.  A culture may be taken of the discharge. Men  A history and an exam are performed.  Any discharge from the penis or areas of  cracked skin will be looked at under the microscope and cultured.  Stool samples may be cultured. TREATMENT  Women  Vaginal antifungal suppositories and creams.  Medicated creams to decrease irritation and itching on the outside of the vagina.  Warm compresses to the perineal area to decrease swelling and discomfort.  Oral antifungal medications.  Medicated vaginal suppositories or cream for repeated or recurrent infections.  Wash and dry the irritation areas before applying the cream.  Eating yogurt with lactobacillus may help with prevention and treatment.  Sometimes painting the vagina with gentian violet solution may help if creams and suppositories do not work. Men  Antifungal creams and oral antifungal medications.  Sometimes treatment must continue for 30 days after the symptoms go away to prevent recurrence. HOME CARE INSTRUCTIONS  Women  Use cotton underwear and avoid tight-fitting clothing.  Avoid colored, scented toilet paper and deodorant tampons or pads.  Do not douche.  Keep your diabetes under control.  Finish all the prescribed medications.  Keep your skin clean and dry.  Consume milk or yogurt with lactobacillus active culture regularly. If you get frequent yeast infections and think that is what the infection is, there are over-the-counter medications that you can get. If the infection does not show healing in 3 days, talk to your caregiver.  Tell your sex   partner you have a yeast infection. Your partner may need treatment also, especially if your infection does not clear up or recurs. Men  Keep your skin clean and dry.  Keep your diabetes under control.  Finish all prescribed medications.  Tell your sex partner that you have a yeast infection so they can be treated if necessary. SEEK MEDICAL CARE IF:   Your symptoms do not clear up or worsen in one week after treatment.  You have an oral temperature above 102 F (38.9 C).  You have  trouble swallowing or eating for a prolonged time.  You develop blisters on and around your vagina.  You develop vaginal bleeding and it is not your menstrual period.  You develop abdominal pain.  You develop intestinal problems as mentioned above.  You get weak or lightheaded.  You have painful or increased urination.  You have pain during sexual intercourse. MAKE SURE YOU:   Understand these instructions.  Will watch your condition.  Will get help right away if you are not doing well or get worse. Document Released: 10/14/2004 Document Revised: 11/29/2011 Document Reviewed: 01/26/2010 [employer] Patient Information 2013 [employer].  

## 2012-08-03 ENCOUNTER — Telehealth: Payer: Self-pay | Admitting: *Deleted

## 2012-08-03 NOTE — Telephone Encounter (Signed)
Pt called requesting results of lab work done at [facility] on 1/11, pt also states that she feels that medication for UTI is not working and also the vaginal itching has returned after taking the 1 dose of Diflucan. Pt is requesting alternative medications for sxs-please advise.

## 2012-08-04 ENCOUNTER — Other Ambulatory Visit: Payer: Self-pay | Admitting: Internal Medicine

## 2012-08-04 DIAGNOSIS — N390 Urinary tract infection, site not specified: Secondary | ICD-10-CM

## 2012-08-04 DIAGNOSIS — B379 Candidiasis, unspecified: Secondary | ICD-10-CM

## 2012-08-04 MED ORDER — SULFAMETHOXAZOLE-TMP DS 800-160 MG PO TABS
1.0000 | ORAL_TABLET | Freq: Two times a day (BID) | ORAL | Status: DC
Start: 2012-08-04 — End: 2012-12-26

## 2012-08-04 MED ORDER — FLUCONAZOLE 150 MG PO TABS: 150.0000 mg | ORAL_TABLET | Freq: Once | ORAL | Status: DC

## 2012-08-04 NOTE — Telephone Encounter (Signed)
Pt informed of new rx for Septra and Diflucan.

## 2012-08-04 NOTE — Telephone Encounter (Signed)
[name] [name], Her HgbA1c was 6.2. She does not have diabetes at this point. I will change her medication regimen to Cipro BID x 5 days and reorder the diflucan for the vaginal itching. Thx! Rene Kocher

## 2012-08-04 NOTE — Telephone Encounter (Signed)
Ash, I did not call in Cipro because she is allergic to it. I changed it to Septra x 5 days along with a refill on the diflucan. Thx! Rene Kocher

## 2012-08-04 NOTE — Progress Notes (Signed)
Per telephone note, Macrobid not working for pt's UTI. Will switch to Cipro BID x 5 days. Also, still having vaginal itching after diflucan. Will reorder.

## 2012-08-28 ENCOUNTER — Ambulatory Visit (INDEPENDENT_AMBULATORY_CARE_PROVIDER_SITE_OTHER): Payer: 59 | Admitting: Obstetrics and Gynecology

## 2012-08-28 ENCOUNTER — Other Ambulatory Visit: Payer: Self-pay | Admitting: Obstetrics and Gynecology

## 2012-08-28 ENCOUNTER — Encounter: Payer: Self-pay | Admitting: Obstetrics and Gynecology

## 2012-08-28 VITALS — BP 120/76 | Ht 61.0 in | Wt 172.0 lb

## 2012-08-28 DIAGNOSIS — Z01419 Encounter for gynecological examination (general) (routine) without abnormal findings: Secondary | ICD-10-CM

## 2012-08-28 DIAGNOSIS — N898 Other specified noninflammatory disorders of vagina: Secondary | ICD-10-CM

## 2012-08-28 DIAGNOSIS — L293 Anogenital pruritus, unspecified: Secondary | ICD-10-CM

## 2012-08-28 LAB — URINALYSIS W MICROSCOPIC + REFLEX CULTURE
Bilirubin Urine: NEGATIVE
Casts: NONE SEEN
Glucose, UA: NEGATIVE mg/dL
Hgb urine dipstick: NEGATIVE
Ketones, ur: NEGATIVE mg/dL
Protein, ur: NEGATIVE mg/dL
pH: 5.5 (ref 5.0–8.0)

## 2012-08-28 LAB — WET PREP FOR TRICH, YEAST, CLUE
Trich, Wet Prep: NONE SEEN
Yeast Wet Prep HPF POC: NONE SEEN

## 2012-08-28 MED ORDER — OXICONAZOLE NITRATE 1 % EX LOTN: TOPICAL_LOTION | Freq: Two times a day (BID) | CUTANEOUS | Status: DC

## 2012-08-28 MED ORDER — TERCONAZOLE 0.8 % VA CREA: 1.0000 | TOPICAL_CREAM | Freq: Every day | VAGINAL | Status: DC

## 2012-08-28 NOTE — Progress Notes (Addendum)
Patient came to see me today for her annual GYN exam. She's had 3 episodes of urinary tract infections this year that were  treated elsewhere. She currently does not have dysuria. She does not have urinary frequency currently either. She is however having recurrent vulvar itching. She has been treated  with Diflucan and Monistat with immediate recurrence of symptoms. She said we gave her oral refresh last year. During the month she was on it she had no recurrences of her yeast but reoccurred immediately when she stopped at. She is postmenopausal. She is doing well without hormone replacement. She is having no vaginal bleeding. She is having no pelvic pain. She is up-to-date on mammograms. She has had 2 normal bone densities. Her last bone density was 2008. She was treated by me for cervical dysplasia. This was prior to 1994 and she has had normal Pap smears since then on a yearly basis. We will get her old records to find out the exact dates. She had a normal Pap smear in 2012. In 1991 she had an exploratory laparotomy with excision of a dermoid of right ovary and endometrioma of  the left ovary. In 1994 she had a myomectomy and excision of a dermoid of her left ovary. She does her lab through PCP. She also has a rash that itches on her lower abdomen.  Physical examination:Kim Julian Reil present. HEENT within normal limits. Neck: Thyroid not large. No masses. Supraclavicular nodes: not enlarged. Breasts: Examined in both sitting and lying  position. No skin changes and no masses. Abdomen: Soft no guarding rebound or masses or hernia. Raised rash on her lower abdomen which looks like skin fungal infection. Pelvic: External: Within normal limits. BUS: Within normal limits. Vaginal:within normal limits. Good estrogen effect. No evidence of cystocele rectocele or enterocele. Wet prep negative. Cervix: clean. Uterus: Normal size and shape. Adnexa: No masses. Rectovaginal exam: Confirmatory and negative. Extremities:  Within normal limits. Urinalysis showed 0-2 red blood cells and white blood cells.  Assessment: #1. CIN-1 #2. Recurrent yeast vaginitis #3. Recurrent urinary tract infections #4. Probable cutaneous yeast infection of lower abdomen.  Plan: Pap not done.The new Pap smear guidelines were discussed with the patient. We will get her records to see when she was treated. Terconazole 3 cream-use both externally and internally now and repeat in 30 days. Reinitiate oral refresh. Oxistat lotion externally. Dermatology consult if skin rash persists. Schedule  bone density. Continue yearly mammograms. If urinary tract infections persist urology consult.  Addendum: I reviewed patient's records. She had a colposcopy with biopsy in May, 1994. It revealed CIN-1. She was treated with observation and it cleared.

## 2012-08-28 NOTE — Patient Instructions (Signed)
Schedule  bone density. Continue yearly mammograms. Restart oral rephresh and use three times a week. If working continue on as maintenance therapy and do not stop.

## 2012-09-01 ENCOUNTER — Ambulatory Visit (INDEPENDENT_AMBULATORY_CARE_PROVIDER_SITE_OTHER): Payer: 59 | Admitting: Internal Medicine

## 2012-09-01 ENCOUNTER — Encounter: Payer: Self-pay | Admitting: Internal Medicine

## 2012-09-01 VITALS — BP 134/82 | HR 80 | Temp 98.9°F | Ht 62.0 in | Wt 168.2 lb

## 2012-09-01 DIAGNOSIS — I10 Essential (primary) hypertension: Secondary | ICD-10-CM

## 2012-09-01 DIAGNOSIS — N870 Mild cervical dysplasia: Secondary | ICD-10-CM | POA: Insufficient documentation

## 2012-09-01 DIAGNOSIS — E119 Type 2 diabetes mellitus without complications: Secondary | ICD-10-CM

## 2012-09-01 DIAGNOSIS — R21 Rash and other nonspecific skin eruption: Secondary | ICD-10-CM

## 2012-09-01 MED ORDER — METHYLPREDNISOLONE ACETATE 80 MG/ML IJ SUSP
120.0000 mg | Freq: Once | INTRAMUSCULAR | Status: AC
  Administered 2012-09-01: 120 mg via INTRAMUSCULAR

## 2012-09-01 MED ORDER — PREDNISONE 10 MG PO TABS: ORAL_TABLET | ORAL | Status: DC

## 2012-09-01 NOTE — Patient Instructions (Addendum)
You had the steroid shot today Take all new medications as prescribed - the prednisone Continue all other medications as before

## 2012-09-02 ENCOUNTER — Encounter: Payer: Self-pay | Admitting: Internal Medicine

## 2012-09-02 NOTE — Assessment & Plan Note (Signed)
stable overall by hx and exam, most recent data reviewed with pt, and pt to continue medical treatment as before Lab Results  Component Value Date   HGBA1C 6.2 07/31/2012   To call for cbg > 200 on steroid

## 2012-09-02 NOTE — Progress Notes (Signed)
Subjective:    Patient ID: [name] [name], female    DOB: Dec 20, 1955, 56 y.o.   MRN: [id number]  HPI  Here with acute onset pruritic rash, mild but marked pruritic to right face/forehead lesser to left face and also an area similar rash to left wrist;  No obvious contact, food, otc or rx med use or change that would seem temporally related;   Pt denies fever, wt loss, night sweats, loss of appetite, or other constitutional symptoms  Pt denies chest pain, increased sob or doe, wheezing, orthopnea, PND, increased LE swelling, palpitations, dizziness or syncope.  No tongue or throat swelling.  Pt denies new neurological symptoms such as new headache, or facial or extremity weakness or numbness   Pt denies polydipsia, polyuria, Past Medical History  Diagnosis Date  . ALLERGIC RHINITIS 04/06/2007  . COLONIC POLYPS, HX OF 07/04/2008  . COLONIC POLYPS 08/04/2007  . DIARRHEA, ACUTE 09/23/2010  . Dysmenorrhea 11/30/2007  . ENDOMETRIAL POLYP 01/11/2002  . ENDOMETRIOSIS 11/30/2007  . GASTRIC POLYP 08/04/2007  . GERD 08/04/2007  . Headache 11/13/2008  . HYPERLIPIDEMIA 04/06/2007  . ISCHEMIC COLITIS, HX OF 07/06/2007  . OTITIS MEDIA, LEFT 06/19/2009  . Palpitations 03/10/2009  . PYELONEPHRITIS, ACUTE 09/23/2010  . Rosacea 11/30/2007  . SINUSITIS- ACUTE-NOS 09/11/2008  . SKIN LESION 06/19/2009  . Urinary frequency 09/11/2008  . UTI'S, RECURRENT 11/30/2007  . ANXIETY 04/06/2007  . GOITER, MULTINODULAR 03/10/2009  . THYROID NODULE, LEFT 11/04/2010  . CIN I (cervical intraepithelial neoplasia I)   . Fibroid   . HYPERTENSION 04/06/2007   Past Surgical History  Procedure Date  . Dermoid cyst  excision 1994    right ovary  . Uterine fibroids removed 1996  . Right foot neuroma 1988  . Removed skull cyst 1973  . Flexible sigmoidoscopy 11/16/95  . Appendectomy 2005  . Bladder tack   . Cystoscopy 2007  . Lt. pectoral/subclavicular abscess drainage 2006  . Colposcopy     reports that she has never smoked. She  does not have any smokeless tobacco history on file. She reports that she drinks alcohol. She reports that she does not use illicit drugs. family history includes Breast cancer in her paternal aunt; Cancer in her maternal grandmother and mother; Diabetes in her mother; Heart disease in her father and mother; Hypertension in her mother; Lung cancer in her maternal grandmother and mother; and Stroke in her father. Allergies  Allergen Reactions  . Atorvastatin     REACTION: extreme muscle aches  . Cefuroxime Axetil     REACTION: DIARRHEA  . Ciprofloxacin     REACTION: diarrhea  . Latex     REACTION: rash  . Lipitor (Atorvastatin Calcium) Other (See Comments)    sensitive  . Moxifloxacin     REACTION: DIARRHEA   Current Outpatient Prescriptions on File Prior to Visit  Medication Sig Dispense Refill  . aspirin 81 MG EC tablet Take 81 mg by mouth daily.        . cetirizine (ZYRTEC) 10 MG tablet Take 10 mg by mouth daily.        . cholecalciferol (VITAMIN D) 1000 UNITS tablet Take 1,000 Units by mouth daily.        Marland Kitchen guaiFENesin (MUCINEX) 600 MG 12 hr tablet Take 1,200 mg by mouth 2 (two) times daily.        . Multiple Vitamin (MULTIVITAMIN) capsule Take 1 capsule by mouth daily.        Marland Kitchen omeprazole (PRILOSEC) 20 MG capsule Take  20 mg by mouth 2 (two) times daily.        Marland Kitchen oxiconazole (OXISTAT) 1 % lotion Apply topically 2 (two) times daily.  30 mL  0  . oxymetazoline (AFRIN) 0.05 % nasal spray Place 2 sprays into the nose as needed.        . potassium chloride SA (KLOR-CON M20) 20 MEQ tablet Take 1 tablet (20 mEq total) by mouth 2 (two) times daily.  180 tablet  3  . rosuvastatin (CRESTOR) 20 MG tablet Take 1 tablet (20 mg total) by mouth daily.  90 tablet  3  . terconazole (TERAZOL 3) 0.8 % vaginal cream Place 1 applicator vaginally at bedtime.  20 g  2  . valsartan-hydrochlorothiazide (DIOVAN-HCT) 320-25 MG per tablet Take 1 tablet by mouth daily.  90 tablet  3  . fluconazole (DIFLUCAN)  150 MG tablet Take 1 tablet (150 mg total) by mouth once.  1 tablet  0  . nitrofurantoin, macrocrystal-monohydrate, (MACROBID) 100 MG capsule Take 1 capsule (100 mg total) by mouth 2 (two) times daily.  10 capsule  0  . sulfamethoxazole-trimethoprim (BACTRIM DS) 800-160 MG per tablet Take 1 tablet by mouth 2 (two) times daily.  10 tablet  0    Review of Systems  Constitutional: Negative for diaphoresis and unexpected weight change.  HENT: Negative for tinnitus.   Eyes: Negative for photophobia and visual disturbance.  Respiratory: Negative for choking and stridor.   Gastrointestinal: Negative for vomiting and blood in stool.  Genitourinary: Negative for hematuria and decreased urine volume.  Musculoskeletal: Negative for gait problem.  Skin: Negative for color change and wound.  Neurological: Negative for tremors and numbness.  Psychiatric/Behavioral: Negative for decreased concentration. The patient is not hyperactive.      Objective:   Physical Exam BP 134/82  Pulse 80  Temp 98.9 F (37.2 C) (Oral)  Ht 5\' 2"  (1.575 m)  Wt 168 lb 4 oz (76.318 kg)  BMI 30.77 kg/m2  SpO2 96% Physical Exam  VS noted, not ill appearing Constitutional: Pt appears well-developed and well-nourished.  HENT: Head: Normocephalic.  Right Ear: External ear normal.  Left Ear: External ear normal.  Eyes: Conjunctivae and EOM are normal. Pupils are equal, round, and reactive to light.  Neck: Normal range of motion. Neck supple.  Cardiovascular: Normal rate and regular rhythm.   Pulmonary/Chest: Effort normal and breath sounds normal.  Neurological: Pt is alert. Not confused  Skin: Skin is warm. No erythema. Except for fine but numerous erythem lesion to face and forehead right > left, also 2 cm area left post wrist Psychiatric: Pt behavior is normal. Thought content normal.     Assessment & Plan:

## 2012-09-02 NOTE — Assessment & Plan Note (Signed)
Unclear etiology, prob allergic - for depomedrol IM, and predpack asd,  to f/u any worsening symptoms or concerns

## 2012-09-02 NOTE — Assessment & Plan Note (Signed)
stable overall by hx and exam, most recent data reviewed with pt, and pt to continue medical treatment as before BP Readings from Last 3 Encounters:  09/01/12 134/82  08/28/12 120/76  07/31/12 102/62

## 2012-09-20 DIAGNOSIS — L900 Lichen sclerosus et atrophicus: Secondary | ICD-10-CM

## 2012-09-20 HISTORY — DX: Lichen sclerosus et atrophicus: L90.0

## 2012-10-02 ENCOUNTER — Other Ambulatory Visit: Payer: Self-pay | Admitting: Gynecology

## 2012-10-02 DIAGNOSIS — Z13820 Encounter for screening for osteoporosis: Secondary | ICD-10-CM

## 2012-10-25 ENCOUNTER — Other Ambulatory Visit: Payer: Self-pay

## 2012-10-25 MED ORDER — VALSARTAN-HYDROCHLOROTHIAZIDE 320-25 MG PO TABS: 1.0000 | ORAL_TABLET | Freq: Every day | ORAL | Status: DC

## 2012-10-25 MED ORDER — ROSUVASTATIN CALCIUM 20 MG PO TABS: 20.0000 mg | ORAL_TABLET | Freq: Every day | ORAL | Status: DC

## 2012-10-25 MED ORDER — POTASSIUM CHLORIDE CRYS ER 20 MEQ PO TBCR: 20.0000 meq | EXTENDED_RELEASE_TABLET | Freq: Two times a day (BID) | ORAL | Status: DC

## 2012-12-19 DIAGNOSIS — M8580 Other specified disorders of bone density and structure, unspecified site: Secondary | ICD-10-CM

## 2012-12-19 HISTORY — DX: Other specified disorders of bone density and structure, unspecified site: M85.80

## 2012-12-26 ENCOUNTER — Other Ambulatory Visit: Payer: 59

## 2012-12-26 ENCOUNTER — Ambulatory Visit (INDEPENDENT_AMBULATORY_CARE_PROVIDER_SITE_OTHER): Payer: 59 | Admitting: Internal Medicine

## 2012-12-26 ENCOUNTER — Encounter: Payer: Self-pay | Admitting: Internal Medicine

## 2012-12-26 VITALS — BP 132/88 | HR 73 | Temp 97.4°F | Ht 62.0 in | Wt 178.0 lb

## 2012-12-26 DIAGNOSIS — R300 Dysuria: Secondary | ICD-10-CM

## 2012-12-26 DIAGNOSIS — E119 Type 2 diabetes mellitus without complications: Secondary | ICD-10-CM

## 2012-12-26 DIAGNOSIS — I10 Essential (primary) hypertension: Secondary | ICD-10-CM

## 2012-12-26 DIAGNOSIS — Z0000 Encounter for general adult medical examination without abnormal findings: Secondary | ICD-10-CM

## 2012-12-26 LAB — POCT URINALYSIS DIPSTICK
Bilirubin, UA: NEGATIVE
Glucose, UA: NEGATIVE
Ketones, UA: NEGATIVE
Nitrite, UA: NEGATIVE

## 2012-12-26 MED ORDER — CEPHALEXIN 500 MG PO CAPS: 500.0000 mg | ORAL_CAPSULE | Freq: Four times a day (QID) | ORAL | Status: DC

## 2012-12-26 NOTE — Assessment & Plan Note (Signed)
prob UTI- Mild to mod, for antibx course,  to f/u any worsening symptoms or concerns, for urine studies today

## 2012-12-26 NOTE — Patient Instructions (Signed)
Please take all new medication as prescribed The current medical regimen is effective;  continue present plan and medications. The specimen will be sent for culture You will be contacted by phone if any changes need to be made immediately.  Otherwise, you will receive a letter about your results with an explanation, but please check with MyChart first. Thank you for enrolling in MyChart. Please follow the instructions below to securely access your online medical record. MyChart allows you to send messages to your doctor, view your test results, renew your prescriptions, schedule appointments, and more. Please return in November 2014  for your yearly visit, or sooner if needed, with Lab testing done 3-5 days before

## 2012-12-26 NOTE — Assessment & Plan Note (Signed)
stable overall by history and exam, recent data reviewed with pt, and pt to continue medical treatment as before,  to f/u any worsening symptoms or concerns BP Readings from Last 3 Encounters:  12/26/12 132/88  09/01/12 134/82  08/28/12 120/76

## 2012-12-26 NOTE — Progress Notes (Signed)
Subjective:    Patient ID: [name] [name], female    DOB: August 17, 1956, 57 y.o.   MRN: [id number]  HPI  Here with 2-3 days onset urinary urgency and pain on urination, with ? Fever and general weakness and malaise.  Denies urinary symptoms such as frequency,, flank pain, hematuria or n/v,  Chills.  Hx of renal stone but denies gross hematuria, passing stone.,  Pt denies chest pain, increased sob or doe, wheezing, orthopnea, PND, increased LE swelling, palpitations, dizziness or syncope.  Pt denies polydipsia, polyuria, or low sugar symptoms.  Pt denies new neurological symptoms such as new headache, or facial or extremity weakness or numbness.   Pt states overall good compliance with meds.  Past Medical History  Diagnosis Date  . ALLERGIC RHINITIS 04/06/2007  . COLONIC POLYPS, HX OF 07/04/2008  . COLONIC POLYPS 08/04/2007  . DIARRHEA, ACUTE 09/23/2010  . Dysmenorrhea 11/30/2007  . ENDOMETRIAL POLYP 01/11/2002  . ENDOMETRIOSIS 11/30/2007  . GASTRIC POLYP 08/04/2007  . GERD 08/04/2007  . Headache 11/13/2008  . HYPERLIPIDEMIA 04/06/2007  . ISCHEMIC COLITIS, HX OF 07/06/2007  . OTITIS MEDIA, LEFT 06/19/2009  . Palpitations 03/10/2009  . PYELONEPHRITIS, ACUTE 09/23/2010  . Rosacea 11/30/2007  . SINUSITIS- ACUTE-NOS 09/11/2008  . SKIN LESION 06/19/2009  . Urinary frequency 09/11/2008  . UTI'S, RECURRENT 11/30/2007  . ANXIETY 04/06/2007  . GOITER, MULTINODULAR 03/10/2009  . THYROID NODULE, LEFT 11/04/2010  . CIN I (cervical intraepithelial neoplasia I)   . Fibroid   . HYPERTENSION 04/06/2007   Past Surgical History  Procedure Laterality Date  . Dermoid cyst  excision  1994    right ovary  . Uterine fibroids removed  1996  . Right foot neuroma  1988  . Removed skull cyst  1973  . Flexible sigmoidoscopy  11/16/95  . Appendectomy  2005  . Bladder tack    . Cystoscopy  2007  . Lt. pectoral/subclavicular abscess drainage  2006  . Colposcopy      reports that she has never smoked. She does not have  any smokeless tobacco history on file. She reports that  drinks alcohol. She reports that she does not use illicit drugs. family history includes Breast cancer in her paternal aunt; Cancer in her maternal grandmother and mother; Diabetes in her mother; Heart disease in her father and mother; Hypertension in her mother; Lung cancer in her maternal grandmother and mother; and Stroke in her father. Allergies  Allergen Reactions  . Atorvastatin     REACTION: extreme muscle aches  . Cefuroxime Axetil     REACTION: DIARRHEA  . Ciprofloxacin     REACTION: diarrhea  . Latex     REACTION: rash  . Lipitor (Atorvastatin Calcium) Other (See Comments)    sensitive  . Moxifloxacin     REACTION: DIARRHEA   Current Outpatient Prescriptions on File Prior to Visit  Medication Sig Dispense Refill  . aspirin 81 MG EC tablet Take 81 mg by mouth daily.        . cetirizine (ZYRTEC) 10 MG tablet Take 10 mg by mouth daily.        . cholecalciferol (VITAMIN D) 1000 UNITS tablet Take 1,000 Units by mouth daily.        Marland Kitchen guaiFENesin (MUCINEX) 600 MG 12 hr tablet Take 1,200 mg by mouth 2 (two) times daily.        . Multiple Vitamin (MULTIVITAMIN) capsule Take 1 capsule by mouth daily.        Marland Kitchen omeprazole (PRILOSEC) 20  MG capsule Take 20 mg by mouth 2 (two) times daily.        Marland Kitchen oxymetazoline (AFRIN) 0.05 % nasal spray Place 2 sprays into the nose as needed.        . potassium chloride SA (KLOR-CON M20) 20 MEQ tablet Take 1 tablet (20 mEq total) by mouth 2 (two) times daily.  180 tablet  3  . rosuvastatin (CRESTOR) 20 MG tablet Take 1 tablet (20 mg total) by mouth daily.  90 tablet  3  . valsartan-hydrochlorothiazide (DIOVAN-HCT) 320-25 MG per tablet Take 1 tablet by mouth daily.  90 tablet  3   No current facility-administered medications on file prior to visit.   Review of Systems  Constitutional: Negative for unexpected weight change, or unusual diaphoresis  HENT: Negative for tinnitus.   Eyes: Negative for  photophobia and visual disturbance.  Respiratory: Negative for choking and stridor.   Gastrointestinal: Negative for vomiting and blood in stool.  Genitourinary: Negative for hematuria and decreased urine volume.  Musculoskeletal: Negative for acute joint swelling Skin: Negative for color change and wound.  Neurological: Negative for tremors and numbness other than noted  Psychiatric/Behavioral: Negative for decreased concentration or  hyperactivity.       Objective:   Physical Exam SUBJECTIVE: VS noted, mild ill Constitutional: Pt appears well-developed and well-nourished.  HENT: Head: NCAT.  Right Ear: External ear normal.  Left Ear: External ear normal.  Eyes: Conjunctivae and EOM are normal. Pupils are equal, round, and reactive to light.  Neck: Normal range of motion. Neck supple.  Cardiovascular: Normal rate and regular rhythm.   Pulmonary/Chest: Effort normal and breath sounds normal.  Abd:  Soft,  non-distended, + BS with low mid abd tender, no buarding or rebound Neurological: Pt is alert. Not confused , motor intact Skin: Skin is warm. No erythema.  Psychiatric: Pt behavior is normal. Thought content normal.     Assessment & Plan:

## 2012-12-26 NOTE — Assessment & Plan Note (Signed)
stable overall by history and exam, recent data reviewed with pt, and pt to continue medical treatment as before,  to f/u any worsening symptoms or concerns Lab Results  Component Value Date   HGBA1C 6.2 07/31/2012

## 2013-01-16 ENCOUNTER — Ambulatory Visit (INDEPENDENT_AMBULATORY_CARE_PROVIDER_SITE_OTHER): Payer: 59

## 2013-01-16 DIAGNOSIS — M899 Disorder of bone, unspecified: Secondary | ICD-10-CM

## 2013-01-16 DIAGNOSIS — M8580 Other specified disorders of bone density and structure, unspecified site: Secondary | ICD-10-CM

## 2013-01-16 DIAGNOSIS — M949 Disorder of cartilage, unspecified: Secondary | ICD-10-CM

## 2013-01-16 DIAGNOSIS — Z13820 Encounter for screening for osteoporosis: Secondary | ICD-10-CM

## 2013-01-18 ENCOUNTER — Encounter: Payer: Self-pay | Admitting: Gynecology

## 2013-01-26 IMAGING — US US SOFT TISSUE HEAD/NECK
1 series · 14 of 25 positions shown · non-contrast
Comparison: Multiple prior ultrasound examinations.  The most
recent is 11/04/2010.

CLINICAL DATA: Follow up thyroid nodules.

THYROID ULTRASOUND
TECHNIQUE: Ultrasound examination of the thyroid gland and adjacent
soft tissues was performed.

[Series 1: us soft tissue head/neck · 0.09mm/px · 14 of 54 slices shown]
[im 1/54]
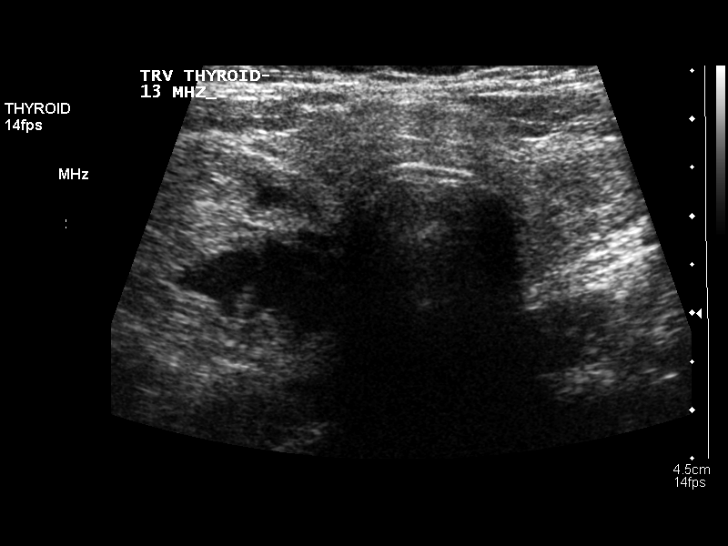
[im 5/54]
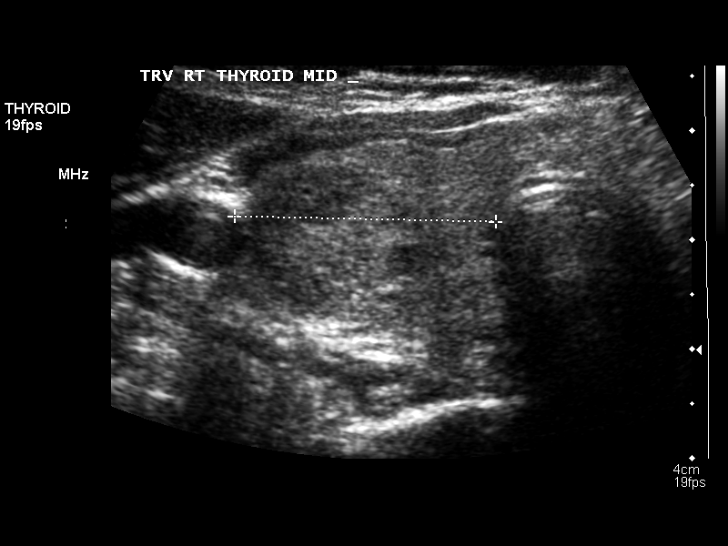
[im 9/54]
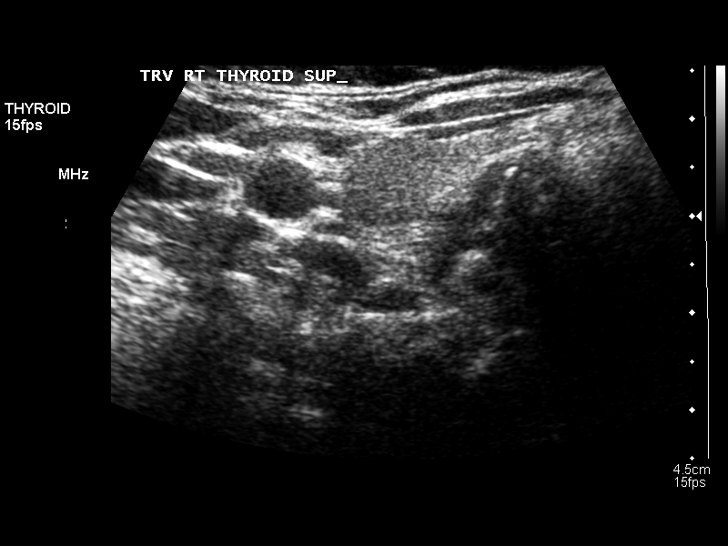
[im 14/54]
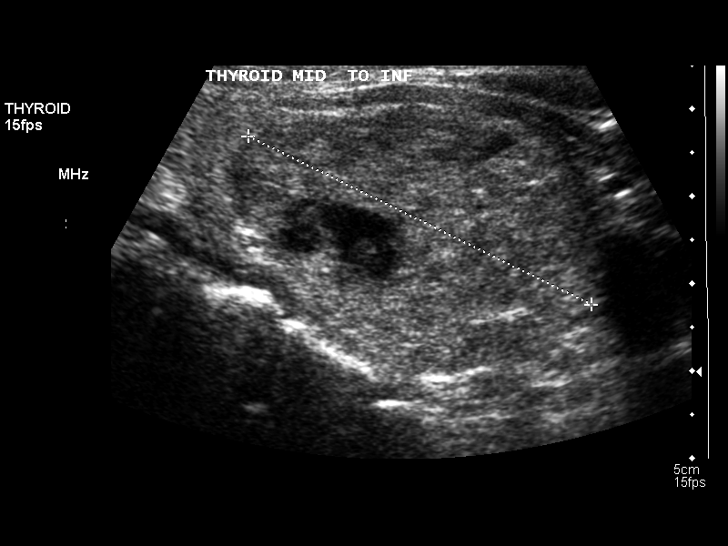
[im 18/54]
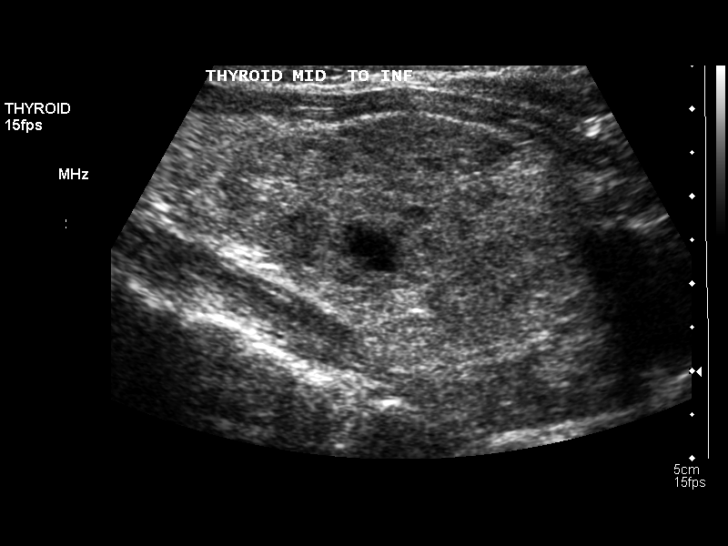
[im 20/54]
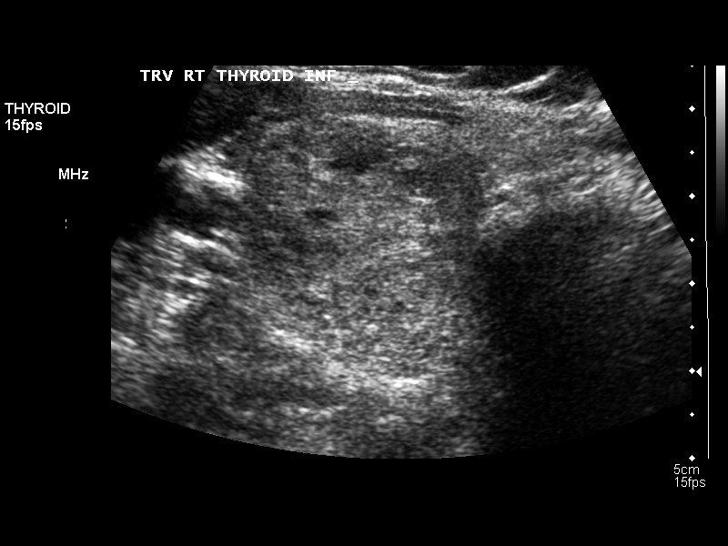
[im 25/54]
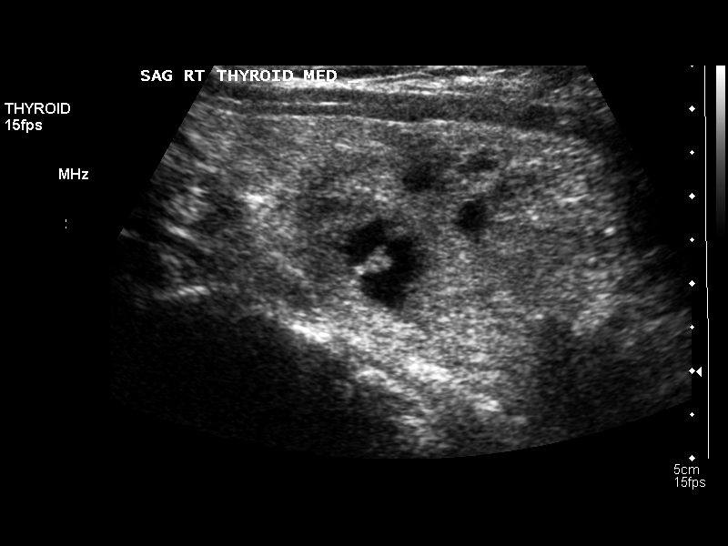
[im 29/54]
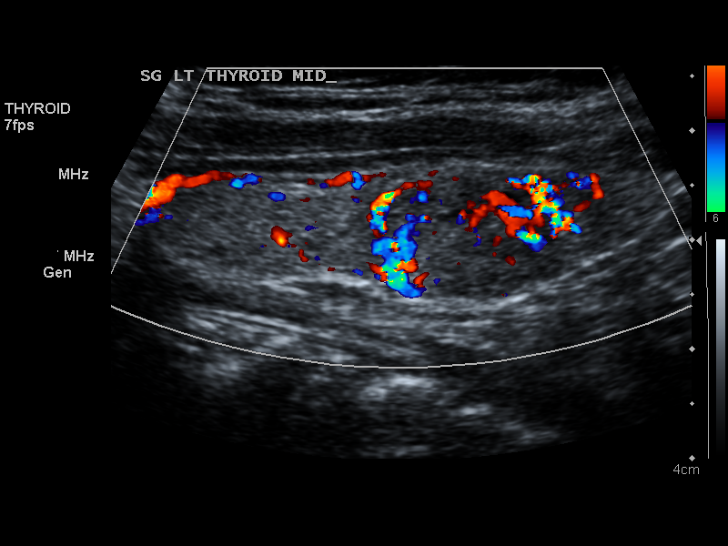
[im 34/54]
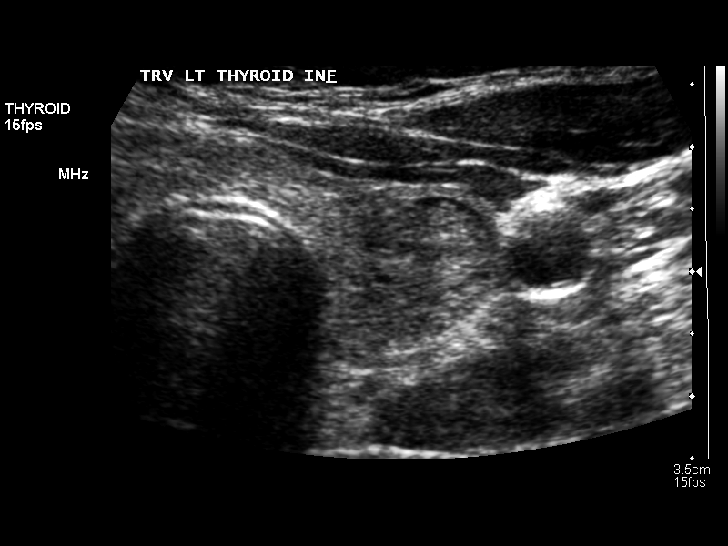
[im 36/54]
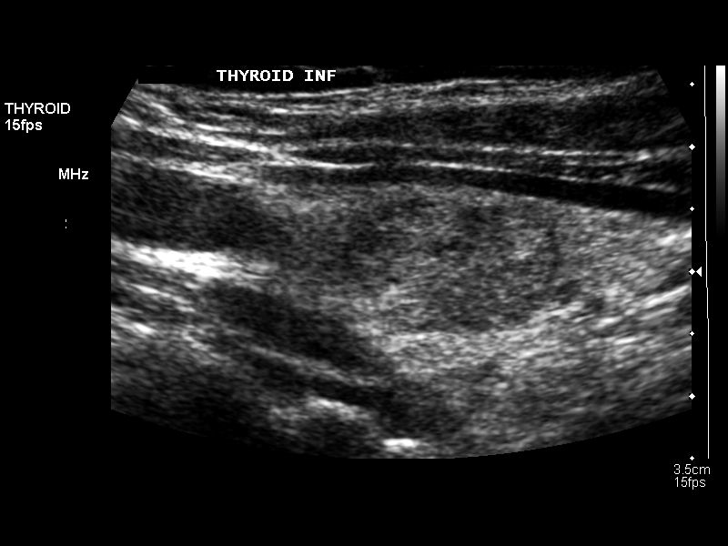
[im 40/54]
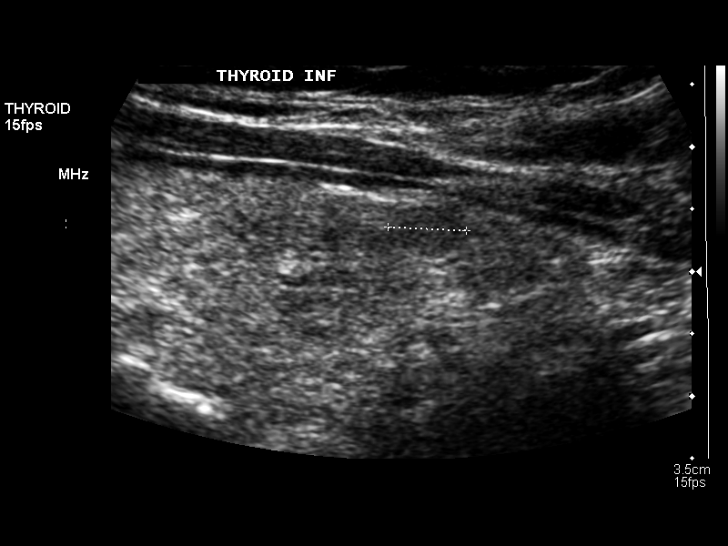
[im 45/54]
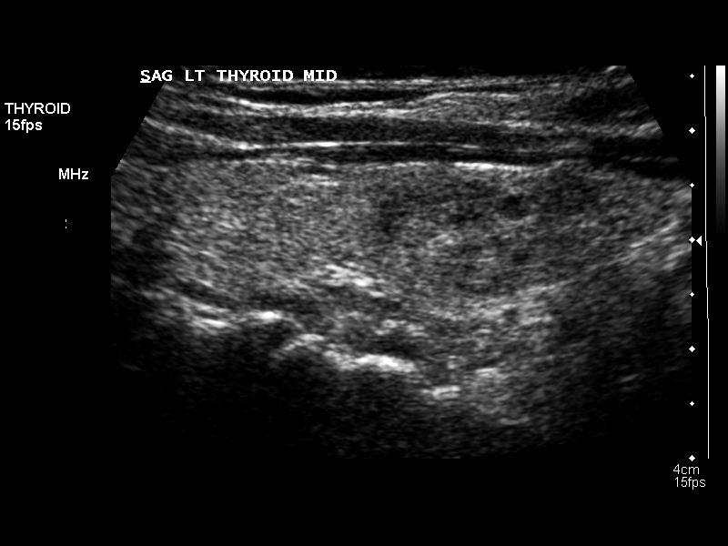
[im 49/54]
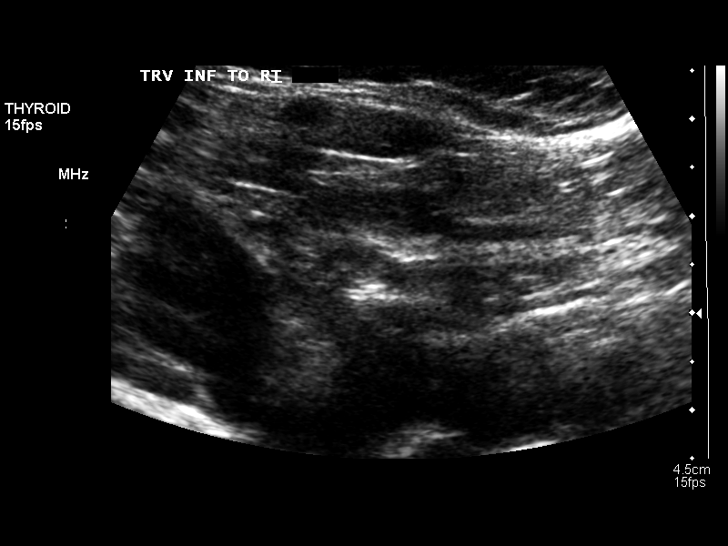
[im 54/54]
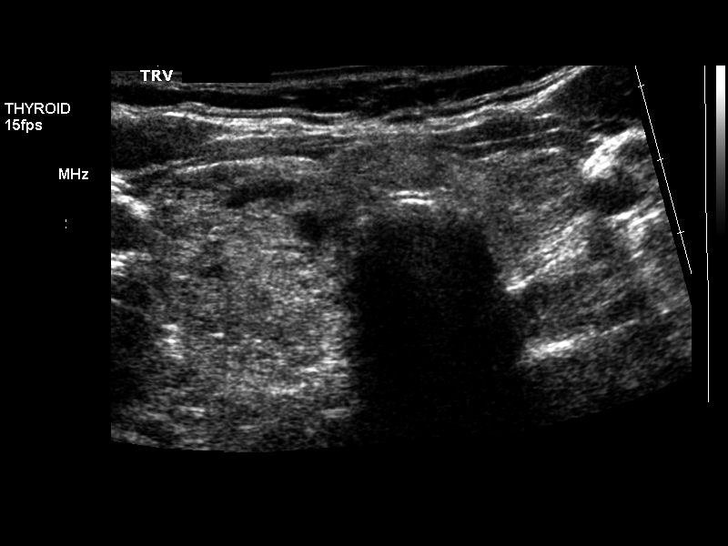

[14 of 25 positions shown; findings below may reference images not displayed]

FINDINGS: Right thyroid lobe:  Measures 6.4 x 3.3 x 2.4 cm.
Left thyroid lobe:  Measures 4.5 x 1.4 x 1.6 cm.
Isthmus:  Measures 0.5 cm.

Inhomogeneous echogenicity.

Focal nodules:  The dominant nodule in the right lobe inferiorly
measures 4.4 x 2.9 x 3.0 cm.  It previously measured 4.4 x 2.4 x
2.6 cm.

The largest lesion in the left lobe measures 1.2 x 1.1 x 1.7 cm.
It previously measured 2.7 x 1.1 x 2.0 cm.  Smaller bilateral
nodules are noted.

Lymphadenopathy:  None visualized.
IMPRESSION: Overall stable thyroid nodules.  The right lobe lesion has been
previously biopsied.  Recommend continued yearly surveillance.

## 2013-05-22 ENCOUNTER — Other Ambulatory Visit: Payer: Self-pay

## 2013-05-25 ENCOUNTER — Other Ambulatory Visit: Payer: Self-pay

## 2013-05-25 DIAGNOSIS — Z1231 Encounter for screening mammogram for malignant neoplasm of breast: Secondary | ICD-10-CM

## 2013-06-14 ENCOUNTER — Ambulatory Visit (INDEPENDENT_AMBULATORY_CARE_PROVIDER_SITE_OTHER): Payer: 59 | Admitting: Internal Medicine

## 2013-06-14 ENCOUNTER — Encounter: Payer: Self-pay | Admitting: Internal Medicine

## 2013-06-14 VITALS — BP 132/80 | HR 86 | Temp 97.0°F | Ht 62.0 in | Wt 176.0 lb

## 2013-06-14 DIAGNOSIS — E119 Type 2 diabetes mellitus without complications: Secondary | ICD-10-CM

## 2013-06-14 DIAGNOSIS — E785 Hyperlipidemia, unspecified: Secondary | ICD-10-CM

## 2013-06-14 DIAGNOSIS — I10 Essential (primary) hypertension: Secondary | ICD-10-CM

## 2013-06-14 NOTE — Patient Instructions (Signed)
Please continue all other medications as before Please have the pharmacy call with any other refills you may need. Please continue your efforts at being more active, low cholesterol diet, and weight control. Please return in 3 months, or sooner if needed, with Lab testing done 3-5 days before

## 2013-06-14 NOTE — Progress Notes (Signed)
Subjective:    Patient ID: [name] [name], female    DOB: 09-Dec-1955, 57 y.o.   MRN: [id number]  HPI  Here to f/u with recent community labs with TG 234, glc 117, a1c 6.6, BMI 33.0, ; overall doing ok,  Pt denies chest pain, increased sob or doe, wheezing, orthopnea, PND, increased LE swelling, palpitations, dizziness or syncope.  Pt denies polydipsia, polyuria, or low sugar symptoms such as weakness or confusion improved with po intake.  Pt denies new neurological symptoms such as new headache, or facial or extremity weakness or numbness.   Pt states overall good compliance with meds, has been trying to follow lower cholesterol, diet, with wt overall stable,  but little exercise however. Plans to try to do better. Past Medical History  Diagnosis Date  . ALLERGIC RHINITIS 04/06/2007  . COLONIC POLYPS, HX OF 07/04/2008  . COLONIC POLYPS 08/04/2007  . DIARRHEA, ACUTE 09/23/2010  . Dysmenorrhea 11/30/2007  . ENDOMETRIAL POLYP 01/11/2002  . ENDOMETRIOSIS 11/30/2007  . GASTRIC POLYP 08/04/2007  . GERD 08/04/2007  . Headache(784.0) 11/13/2008  . HYPERLIPIDEMIA 04/06/2007  . ISCHEMIC COLITIS, HX OF 07/06/2007  . OTITIS MEDIA, LEFT 06/19/2009  . Palpitations 03/10/2009  . PYELONEPHRITIS, ACUTE 09/23/2010  . Rosacea 11/30/2007  . SINUSITIS- ACUTE-NOS 09/11/2008  . SKIN LESION 06/19/2009  . Urinary frequency 09/11/2008  . UTI'S, RECURRENT 11/30/2007  . ANXIETY 04/06/2007  . GOITER, MULTINODULAR 03/10/2009  . THYROID NODULE, LEFT 11/04/2010  . CIN I (cervical intraepithelial neoplasia I)   . Fibroid   . HYPERTENSION 04/06/2007  . Osteopenia 12/2012    T score -1.1 FRAX 5.9%/0.3%   Past Surgical History  Procedure Laterality Date  . Dermoid cyst  excision  1994    right ovary  . Uterine fibroids removed  1996  . Right foot neuroma  1988  . Removed skull cyst  1973  . Flexible sigmoidoscopy  11/16/95  . Appendectomy  2005  . Bladder tack    . Cystoscopy  2007  . Lt. pectoral/subclavicular abscess  drainage  2006  . Colposcopy      reports that she has never smoked. She does not have any smokeless tobacco history on file. She reports that  drinks alcohol. She reports that she does not use illicit drugs. family history includes Breast cancer in her paternal aunt; Cancer in her maternal grandmother and mother; Diabetes in her mother; Heart disease in her father and mother; Hypertension in her mother; Lung cancer in her maternal grandmother and mother; Stroke in her father. Allergies  Allergen Reactions  . Atorvastatin     REACTION: extreme muscle aches  . Cefuroxime Axetil     REACTION: DIARRHEA  . Ciprofloxacin     REACTION: diarrhea  . Latex     REACTION: rash  . Lipitor [Atorvastatin Calcium] Other (See Comments)    sensitive  . Moxifloxacin     REACTION: DIARRHEA   Current Outpatient Prescriptions on File Prior to Visit  Medication Sig Dispense Refill  . aspirin 81 MG EC tablet Take 81 mg by mouth daily.        . cetirizine (ZYRTEC) 10 MG tablet Take 10 mg by mouth daily.        . cholecalciferol (VITAMIN D) 1000 UNITS tablet Take 1,000 Units by mouth daily.        Marland Kitchen guaiFENesin (MUCINEX) 600 MG 12 hr tablet Take 1,200 mg by mouth 2 (two) times daily.        . Multiple Vitamin (MULTIVITAMIN)  capsule Take 1 capsule by mouth daily.        . potassium chloride SA (KLOR-CON M20) 20 MEQ tablet Take 1 tablet (20 mEq total) by mouth 2 (two) times daily.  180 tablet  3  . rosuvastatin (CRESTOR) 20 MG tablet Take 1 tablet (20 mg total) by mouth daily.  90 tablet  3  . valsartan-hydrochlorothiazide (DIOVAN-HCT) 320-25 MG per tablet Take 1 tablet by mouth daily.  90 tablet  3   No current facility-administered medications on file prior to visit.   Review of Systems  Constitutional: Negative for unexpected weight change, or unusual diaphoresis  HENT: Negative for tinnitus.   Eyes: Negative for photophobia and visual disturbance.  Respiratory: Negative for choking and stridor.    Gastrointestinal: Negative for vomiting and blood in stool.  Genitourinary: Negative for hematuria and decreased urine volume.  Musculoskeletal: Negative for acute joint swelling Skin: Negative for color change and wound.  Neurological: Negative for tremors and numbness other than noted  Psychiatric/Behavioral: Negative for decreased concentration or  hyperactivity.       Objective:   Physical Exam BP 132/80  Pulse 86  Temp(Src) 97 F (36.1 C) (Oral)  Ht 5\' 2"  (1.575 m)  Wt 176 lb (79.833 kg)  BMI 32.18 kg/m2  SpO2 96% VS noted,  Constitutional: Pt appears well-developed and well-nourished.  HENT: Head: NCAT.  Right Ear: External ear normal.  Left Ear: External ear normal.  Eyes: Conjunctivae and EOM are normal. Pupils are equal, round, and reactive to light.  Neck: Normal range of motion. Neck supple.  Cardiovascular: Normal rate and regular rhythm.   Pulmonary/Chest: Effort normal and breath sounds normal.  Abd:  Soft, NT, non-distended, + BS Neurological: Pt is alert. Not confused  Skin: Skin is warm. No erythema.  Psychiatric: Pt behavior is normal. Thought content normal.         Assessment & Plan:

## 2013-06-17 NOTE — Assessment & Plan Note (Signed)
stable overall by history and exam, recent data reviewed with pt, and pt to continue medical treatment as before,  to f/u any worsening symptoms or concerns BP Readings from Last 3 Encounters:  06/14/13 132/80  12/26/12 132/88  09/01/12 134/82

## 2013-06-17 NOTE — Assessment & Plan Note (Signed)
With mild elev TG recent, to cont low fat diet, wt loss

## 2013-06-17 NOTE — Assessment & Plan Note (Signed)
stable overall by history and exam, recent data reviewed with pt, and pt to continue medical treatment as before,  to f/u any worsening symptoms or concerns Lab Results  Component Value Date   HGBA1C 6.2 07/31/2012   To cont wt loss and diet efforts, no need for OHA at this time

## 2013-07-06 ENCOUNTER — Ambulatory Visit
Admission: RE | Admit: 2013-07-06 | Discharge: 2013-07-06 | Disposition: A | Discharge status: Home / Self Care | Payer: 59 | Source: Ambulatory Visit

## 2013-07-06 DIAGNOSIS — Z1231 Encounter for screening mammogram for malignant neoplasm of breast: Secondary | ICD-10-CM

## 2013-07-26 ENCOUNTER — Other Ambulatory Visit: Payer: Self-pay

## 2013-08-15 ENCOUNTER — Ambulatory Visit: Payer: 59 | Admitting: Internal Medicine

## 2013-08-31 ENCOUNTER — Ambulatory Visit (INDEPENDENT_AMBULATORY_CARE_PROVIDER_SITE_OTHER): Payer: 59 | Admitting: Gynecology

## 2013-08-31 ENCOUNTER — Encounter: Payer: Self-pay | Admitting: Gynecology

## 2013-08-31 VITALS — BP 120/70 | Ht 61.5 in | Wt 175.0 lb

## 2013-08-31 DIAGNOSIS — L292 Pruritus vulvae: Secondary | ICD-10-CM

## 2013-08-31 DIAGNOSIS — L293 Anogenital pruritus, unspecified: Secondary | ICD-10-CM

## 2013-08-31 DIAGNOSIS — Z01419 Encounter for gynecological examination (general) (routine) without abnormal findings: Secondary | ICD-10-CM

## 2013-08-31 DIAGNOSIS — M8580 Other specified disorders of bone density and structure, unspecified site: Secondary | ICD-10-CM

## 2013-08-31 DIAGNOSIS — D179 Benign lipomatous neoplasm, unspecified: Secondary | ICD-10-CM

## 2013-08-31 DIAGNOSIS — M899 Disorder of bone, unspecified: Secondary | ICD-10-CM

## 2013-08-31 NOTE — Patient Instructions (Addendum)
Office will call you with biopsy results and treatment recommendations.

## 2013-08-31 NOTE — Progress Notes (Signed)
[name] [name] Oct 03, 1955 [id number]        57 y.o.  G0P0 for annual exam.  Former patient of Dr. Eda Paschal. Several issues noted below.  Past medical history,surgical history, problem list, medications, allergies, family history and social history were all reviewed and documented in the EPIC chart.  ROS:  Performed and pertinent positives and negatives are included in the history, assessment and plan .  Exam: Kim assistant Filed Vitals:   08/31/13 1040  BP: 120/70  Height: 5' 1.5" (1.562 m)  Weight: 175 lb (79.379 kg)   General appearance  Normal Skin grossly normal Head/Neck normal with no cervical or supraclavicular adenopathy thyroid normal Lungs  clear Cardiac RR, without RMG Abdominal  soft, nontender, without masses, organomegaly or hernia Breasts  examined lying and sitting without masses, retractions, discharge or axillary adenopathy. Pelvic  Ext/BUS/vagina  inflammatory skin changes mid perineal body suspicious for lichen sclerosis with whitish skin changes  Cervix  Normal with atrophic changes  Uterus  anteverted, normal size, shape and contour, midline and mobile nontender   Adnexa  Without masses or tenderness    Anus and perineum  Normal   Rectovaginal  Normal sphincter tone without palpated masses or tenderness.    Assessment/Plan:  57 y.o. G0P0 female for annual exam.   1. Postmenopausal with mild atrophic changes. No significant hot flushes or night sweats. No vaginal bleeding. Is not sexually active. Does note over the past year some fairly intensive vulvar itching. The area she's pointing to is on the perineal body with changes suggestive of lichen sclerosis. Reviewed this with her recommended biopsy confirmation. Patient agrees and subsequently area overlying mid perineal body was cleansed with Betadine, infiltrated with 1% lidocaine and a representative biopsy taken overlying a whitish skin change. So nitrate was applied. Patient will followup her biopsy results  and treatment recommendations. Patient knows to report any vaginal bleeding. 2. History of exploratory laparotomy with excision of dermoid on right ovary and endometrioma left ovary 1991. In 1994 she had a myomectomy and excision of dermoid from her left ovary. 3. Osteopenia. DEXA 12/2012 with T score -1.1. FRAX 5.9%/0.3%. Recommend vitamin D level today. Increase calcium and vitamin D recommendations reviewed. 4. Pap smear 2012. No Pap smear done today. History of CIN-1 per Dr. Verl Dicker note in 1994 with normal Pap smears since then. Reviewed current screening guidelines and will plan repeat Pap smear next year a 3 year interval. 5. Mammography 06/2013. Continued annual mammography. SBE monthly reviewed. 6. Colonoscopy 2008 with recommended repeat interval 10 years per her history. 7. Health maintenance. No routine blood work done and she reports this done through her primary physician's office. Followup for biopsy results and treatment recommendations otherwise annually.   Note: This document was prepared with digital dictation and possible smart phrase technology. Any transcriptional errors that result from this process are unintentional.   Dara Lords MD, 11:51 AM 08/31/2013

## 2013-09-03 ENCOUNTER — Encounter: Payer: Self-pay | Admitting: Gynecology

## 2013-09-04 ENCOUNTER — Encounter: Payer: Self-pay | Admitting: Obstetrics and Gynecology

## 2013-09-06 ENCOUNTER — Encounter: Payer: Self-pay | Admitting: Gynecology

## 2013-09-06 ENCOUNTER — Other Ambulatory Visit: Payer: Self-pay | Admitting: Gynecology

## 2013-09-06 MED ORDER — CLOBETASOL PROPIONATE 0.05 % EX CREA: TOPICAL_CREAM | CUTANEOUS | Status: DC

## 2013-11-13 ENCOUNTER — Other Ambulatory Visit: Payer: Self-pay | Admitting: Internal Medicine

## 2014-01-22 ENCOUNTER — Other Ambulatory Visit: Payer: Self-pay | Admitting: Internal Medicine

## 2014-03-25 ENCOUNTER — Telehealth: Payer: Self-pay | Admitting: *Deleted

## 2014-03-25 DIAGNOSIS — E119 Type 2 diabetes mellitus without complications: Secondary | ICD-10-CM

## 2014-03-25 NOTE — Telephone Encounter (Signed)
Patient scheduled an appointment to follow up with DM and cholesterol.  She will come to the lab before appointment.  A1c, bmet, lipid ordered.  Diabetic bundle

## 2014-04-04 ENCOUNTER — Other Ambulatory Visit (INDEPENDENT_AMBULATORY_CARE_PROVIDER_SITE_OTHER): Payer: 59

## 2014-04-04 DIAGNOSIS — E119 Type 2 diabetes mellitus without complications: Secondary | ICD-10-CM

## 2014-04-04 LAB — BASIC METABOLIC PANEL
BUN: 21 mg/dL (ref 6–23)
CO2: 28 meq/L (ref 19–32)
Calcium: 9.9 mg/dL (ref 8.4–10.5)
Chloride: 104 mEq/L (ref 96–112)
Creatinine, Ser: 0.7 mg/dL (ref 0.4–1.2)
GFR: 89.83 mL/min (ref >60.00)
Glucose, Bld: 121 mg/dL — ABNORMAL HIGH (ref 70–99)
Potassium: 3.8 mEq/L (ref 3.5–5.1)
SODIUM: 140 meq/L (ref 135–145)

## 2014-04-04 LAB — HEMOGLOBIN A1C: HEMOGLOBIN A1C: 6.5 % (ref 4.6–6.5)

## 2014-04-04 LAB — LIPID PANEL
Cholesterol: 166 mg/dL (ref 0–200)
HDL: 49.4 mg/dL (ref >39.00)
LDL Cholesterol: 63 mg/dL (ref 0–99)
NonHDL: 116.6
Total CHOL/HDL Ratio: 3
Triglycerides: 270 mg/dL — ABNORMAL HIGH (ref 0.0–149.0)
VLDL: 54 mg/dL — AB (ref 0.0–40.0)

## 2014-04-10 ENCOUNTER — Ambulatory Visit: Payer: 59 | Admitting: Internal Medicine

## 2014-05-07 ENCOUNTER — Encounter: Payer: Self-pay | Admitting: Internal Medicine

## 2014-05-07 ENCOUNTER — Ambulatory Visit (INDEPENDENT_AMBULATORY_CARE_PROVIDER_SITE_OTHER): Payer: 59 | Admitting: Internal Medicine

## 2014-05-07 VITALS — BP 130/80 | HR 78 | Temp 97.5°F | Ht 62.0 in | Wt 175.1 lb

## 2014-05-07 DIAGNOSIS — E119 Type 2 diabetes mellitus without complications: Secondary | ICD-10-CM

## 2014-05-07 DIAGNOSIS — J309 Allergic rhinitis, unspecified: Secondary | ICD-10-CM

## 2014-05-07 DIAGNOSIS — J9801 Acute bronchospasm: Secondary | ICD-10-CM

## 2014-05-07 DIAGNOSIS — Z0000 Encounter for general adult medical examination without abnormal findings: Secondary | ICD-10-CM

## 2014-05-07 DIAGNOSIS — I10 Essential (primary) hypertension: Secondary | ICD-10-CM

## 2014-05-07 MED ORDER — METHYLPREDNISOLONE ACETATE 80 MG/ML IJ SUSP
80.0000 mg | Freq: Once | INTRAMUSCULAR | Status: AC
  Administered 2014-05-07: 80 mg via INTRAMUSCULAR

## 2014-05-07 MED ORDER — PREDNISONE 10 MG PO TABS: ORAL_TABLET | ORAL | Status: DC

## 2014-05-07 MED ORDER — POTASSIUM CHLORIDE CRYS ER 20 MEQ PO TBCR: 20.0000 meq | EXTENDED_RELEASE_TABLET | Freq: Two times a day (BID) | ORAL | Status: DC

## 2014-05-07 MED ORDER — VALSARTAN-HYDROCHLOROTHIAZIDE 320-25 MG PO TABS: 1.0000 | ORAL_TABLET | Freq: Every day | ORAL | Status: DC

## 2014-05-07 MED ORDER — ROSUVASTATIN CALCIUM 20 MG PO TABS: 20.0000 mg | ORAL_TABLET | Freq: Every day | ORAL | Status: DC

## 2014-05-07 MED ORDER — ROSUVASTATIN CALCIUM 20 MG PO TABS
20.0000 mg | ORAL_TABLET | Freq: Every day | ORAL | Status: DC
Start: 2014-05-07 — End: 2014-05-07

## 2014-05-07 NOTE — Progress Notes (Signed)
Subjective:    Patient ID: [name] [name], female    DOB: June 01, 1956, 58 y.o.   MRN: [id number]  HPI  Here toi f/u, has been moving recently to a new apartment with lots of dust, worried about mold, has large significatn upper resp congestion,  Took an antibiotic(augmentin) from a prescrioption she had from Dr newman/ent; initially felt better, then worse again, has large cough, prod with mostly clearish, heaviness to the upper mid chest/tightness, also some tightness to lower back. Had some ST that comes and goes.  ? Chills but not really sure about chills and sweats.  Pt denies chest pain, increased sob or doe, wheezing, orthopnea, PND, increased LE swelling, palpitations, dizziness or syncope.  Pt denies polydipsia, polyuria, or low sugar symptoms such as weakness or confusion improved with po intake.  Pt denies new neurological symptoms such as new headache, or facial or extremity weakness or numbness.   Pt states overall good compliance with meds, has been trying to follow lower cholesterol, diabetic diet, with wt overall stable. Past Medical History  Diagnosis Date  . ALLERGIC RHINITIS 04/06/2007  . COLONIC POLYPS 08/04/2007  . DIARRHEA, ACUTE 09/23/2010  . GASTRIC POLYP 08/04/2007  . GERD 08/04/2007  . Headache(784.0) 11/13/2008  . HYPERLIPIDEMIA 04/06/2007  . ISCHEMIC COLITIS, HX OF 07/06/2007  . Palpitations 03/10/2009  . Rosacea 11/30/2007  . UTI'S, RECURRENT 11/30/2007  . ANXIETY 04/06/2007  . GOITER, MULTINODULAR 03/10/2009  . THYROID NODULE, LEFT 11/04/2010  . CIN I (cervical intraepithelial neoplasia I)   . Fibroid   . HYPERTENSION 04/06/2007  . Osteopenia 12/2012    T score -1.1 FRAX 5.9%/0.3%  . Hx of blood clots   . Urinary frequency   . Lichen sclerosus 6979   Past Surgical History  Procedure Laterality Date  . Dermoid cyst  excision  1994    right ovary  . Uterine fibroids removed  1996  . Right foot neuroma  1988  . Removed skull cyst  1973  . Flexible sigmoidoscopy   11/16/95  . Appendectomy  2005  . Bladder tack    . Cystoscopy  2007  . Lt. pectoral/subclavicular abscess drainage  2006  . Colposcopy      reports that she has never smoked. She does not have any smokeless tobacco history on file. She reports that she drinks alcohol. She reports that she does not use illicit drugs. family history includes Breast cancer in her paternal aunt; Deep vein thrombosis in her brother; Diabetes in her mother; Heart disease in her father and mother; Hypertension in her mother; Lung cancer in her maternal grandmother and mother; Stroke in her brother and father. Allergies  Allergen Reactions  . Atorvastatin     REACTION: extreme muscle aches  . Cefuroxime Axetil     REACTION: DIARRHEA  . Ciprofloxacin     REACTION: diarrhea  . Latex     REACTION: rash  . Lipitor [Atorvastatin Calcium] Other (See Comments)    sensitive  . Moxifloxacin     REACTION: DIARRHEA   Current Outpatient Prescriptions on File Prior to Visit  Medication Sig Dispense Refill  . aspirin 81 MG EC tablet Take 81 mg by mouth daily.        . cetirizine (ZYRTEC) 10 MG tablet Take 10 mg by mouth daily.        . cholecalciferol (VITAMIN D) 1000 UNITS tablet Take 1,000 Units by mouth daily.        . clobetasol cream (TEMOVATE) 0.05 %  Apply to affected area hs x several weeks then wean off and only use intermittently prn symptoms.  30 g  0  . Esomeprazole Magnesium (NEXIUM PO) Take by mouth.      Marland Kitchen guaiFENesin (MUCINEX) 600 MG 12 hr tablet Take 1,200 mg by mouth 2 (two) times daily.        . nitrofurantoin (MACRODANTIN) 100 MG capsule Take 100 mg by mouth daily.      Marland Kitchen OVER THE COUNTER MEDICATION Candex for yeast      . Probiotic Product (PROBIOTIC PO) Take by mouth.       No current facility-administered medications on file prior to visit.   Review of Systems  Constitutional: Negative for unusual diaphoresis or other sweats  HENT: Negative for ringing in ear Eyes: Negative for double vision  or worsening visual disturbance.  Respiratory: Negative for choking and stridor.   Gastrointestinal: Negative for vomiting or other signifcant bowel change Genitourinary: Negative for hematuria or decreased urine volume.  Musculoskeletal: Negative for other MSK pain or swelling Skin: Negative for color change and worsening wound.  Neurological: Negative for tremors and numbness other than noted  Psychiatric/Behavioral: Negative for decreased concentration or agitation other than above       Objective:   Physical Exam BP 130/80  Pulse 78  Temp(Src) 97.5 F (36.4 C) (Oral)  Ht 5\' 2"  (1.575 m)  Wt 175 lb 2 oz (79.436 kg)  BMI 32.02 kg/m2  SpO2 96% VS noted,  Constitutional: Pt appears well-developed, well-nourished.  HENT: Head: NCAT.  Right Ear: External ear normal.  Left Ear: External ear normal.  Bilat tm's with mild erythema.  Max sinus areas non tender.  Pharynx with mild erythema, no exudate Eyes: . Pupils are equal, round, and reactive to light. Conjunctivae and EOM are normal Neck: Normal range of motion. Neck supple. has known right thyroid nodule Cardiovascular: Normal rate and regular rhythm.   Pulmonary/Chest: Effort normal and breath sounds decreased mild with few wheze bilat.  Neurological: Pt is alert. Not confused , motor grossly intact Skin: Skin is warm. No rash Psychiatric: Pt behavior is normal. No agitation. mild nervous     Assessment & Plan:

## 2014-05-07 NOTE — Assessment & Plan Note (Signed)
stable overall by history and exam, recent data reviewed with pt, and pt to continue medical treatment as before,  to f/u any worsening symptoms or concerns Lab Results  Component Value Date   HGBA1C 6.5 04/04/2014

## 2014-05-07 NOTE — Progress Notes (Signed)
Pre visit review using our clinic review tool, if applicable. No additional management support is needed unless otherwise documented below in the visit note. 

## 2014-05-07 NOTE — Addendum Note (Signed)
Addended by: Sharon Seller B on: 05/07/2014 11:47 AM   Modules accepted: Orders

## 2014-05-07 NOTE — Assessment & Plan Note (Signed)
To cont zyrtec asd,  to f/u any worsening symptoms or concerns

## 2014-05-07 NOTE — Patient Instructions (Addendum)
You had the steroid shot today  Please continue all other medications as before, and refills have been done if requested.  Please have the pharmacy call with any other refills you may need.  Please continue your efforts at being more active, low cholesterol diet, and weight control.  Please keep your appointments with your specialists as you may have planned  Please return in 3 months, or sooner if needed, with Lab testing done 3-5 days before

## 2014-05-07 NOTE — Assessment & Plan Note (Signed)
stable overall by history and exam, recent data reviewed with pt, and pt to continue medical treatment as before,  to f/u any worsening symptoms or concerns BP Readings from Last 3 Encounters:  05/07/14 130/80  08/31/13 120/70  06/14/13 132/80

## 2014-05-07 NOTE — Assessment & Plan Note (Signed)
With rare flare assoc with allergy flare, for depomedrol IM, predpac asd,  to f/u any worsening symptoms or concerns, has alb MDI at home for prn use

## 2014-05-08 ENCOUNTER — Telehealth: Payer: Self-pay | Admitting: Internal Medicine

## 2014-05-08 NOTE — Telephone Encounter (Signed)
Relevant patient education assigned to patient using Emmi. ° °

## 2014-07-02 ENCOUNTER — Ambulatory Visit (INDEPENDENT_AMBULATORY_CARE_PROVIDER_SITE_OTHER): Payer: 59 | Admitting: Internal Medicine

## 2014-07-02 ENCOUNTER — Encounter: Payer: Self-pay | Admitting: Internal Medicine

## 2014-07-02 VITALS — BP 130/80 | HR 83 | Temp 98.2°F | Ht 62.0 in | Wt 170.0 lb

## 2014-07-02 DIAGNOSIS — I10 Essential (primary) hypertension: Secondary | ICD-10-CM

## 2014-07-02 DIAGNOSIS — E119 Type 2 diabetes mellitus without complications: Secondary | ICD-10-CM

## 2014-07-02 DIAGNOSIS — R21 Rash and other nonspecific skin eruption: Secondary | ICD-10-CM

## 2014-07-02 DIAGNOSIS — E669 Obesity, unspecified: Secondary | ICD-10-CM

## 2014-07-02 MED ORDER — TRIAMCINOLONE ACETONIDE 0.1 % EX CREA
1.0000 | TOPICAL_CREAM | Freq: Two times a day (BID) | CUTANEOUS | Status: DC
Start: 2014-07-02 — End: 2014-08-28

## 2014-07-02 NOTE — Patient Instructions (Signed)
Please take all new medication as prescribed - the steroid cream  Please continue all other medications as before, and refills have been done if requested.  Please have the pharmacy call with any other refills you may need.  Please keep your appointments with your specialists as you may have planned  Your form was signed today

## 2014-07-02 NOTE — Progress Notes (Signed)
Subjective:    Patient ID: [name] [name], female    DOB: 08/05/1956, 58 y.o.   MRN: [id number]  HPI  Here to f/u; overall doing ok,  Pt denies chest pain, increased sob or doe, wheezing, orthopnea, PND, increased LE swelling, palpitations, dizziness or syncope.  Pt denies polydipsia, polyuria, or low sugar symptoms such as weakness or confusion improved with po intake.  Pt denies new neurological symptoms such as new headache, or facial or extremity weakness or numbness.   Pt states overall good compliance with meds, has been trying to follow lower cholesterol, diabetic diet, with wt overall down 5 lbs with better diet Wt Readings from Last 3 Encounters:  07/02/14 170 lb (77.111 kg)  05/07/14 175 lb 2 oz (79.436 kg)  08/31/13 175 lb (79.379 kg)  Also has a small itchy erythem superficial area to left arm just prox to the antecub, no pain, tender, swelilng or drainage. Needs work form signed today Past Medical History  Diagnosis Date  . ALLERGIC RHINITIS 04/06/2007  . COLONIC POLYPS 08/04/2007  . DIARRHEA, ACUTE 09/23/2010  . GASTRIC POLYP 08/04/2007  . GERD 08/04/2007  . Headache(784.0) 11/13/2008  . HYPERLIPIDEMIA 04/06/2007  . ISCHEMIC COLITIS, HX OF 07/06/2007  . Palpitations 03/10/2009  . Rosacea 11/30/2007  . UTI'S, RECURRENT 11/30/2007  . ANXIETY 04/06/2007  . GOITER, MULTINODULAR 03/10/2009  . THYROID NODULE, LEFT 11/04/2010  . CIN I (cervical intraepithelial neoplasia I)   . Fibroid   . HYPERTENSION 04/06/2007  . Osteopenia 12/2012    T score -1.1 FRAX 5.9%/0.3%  . Hx of blood clots   . Urinary frequency   . Lichen sclerosus 4034   Past Surgical History  Procedure Laterality Date  . Dermoid cyst  excision  1994    right ovary  . Uterine fibroids removed  1996  . Right foot neuroma  1988  . Removed skull cyst  1973  . Flexible sigmoidoscopy  11/16/95  . Appendectomy  2005  . Bladder tack    . Cystoscopy  2007  . Lt. pectoral/subclavicular abscess drainage  2006  .  Colposcopy      reports that she has never smoked. She does not have any smokeless tobacco history on file. She reports that she drinks alcohol. She reports that she does not use illicit drugs. family history includes Breast cancer in her paternal aunt; Deep vein thrombosis in her brother; Diabetes in her mother; Heart disease in her father and mother; Hypertension in her mother; Lung cancer in her maternal grandmother and mother; Stroke in her brother and father. Allergies  Allergen Reactions  . Atorvastatin     REACTION: extreme muscle aches  . Cefuroxime Axetil     REACTION: DIARRHEA  . Ciprofloxacin     REACTION: diarrhea  . Latex     REACTION: rash  . Lipitor [Atorvastatin Calcium] Other (See Comments)    sensitive  . Moxifloxacin     REACTION: DIARRHEA   Current Outpatient Prescriptions on File Prior to Visit  Medication Sig Dispense Refill  . aspirin 81 MG EC tablet Take 81 mg by mouth daily.        . cetirizine (ZYRTEC) 10 MG tablet Take 10 mg by mouth daily.        . cholecalciferol (VITAMIN D) 1000 UNITS tablet Take 1,000 Units by mouth daily.        . clobetasol cream (TEMOVATE) 0.05 % Apply to affected area hs x several weeks then wean off and only use  intermittently prn symptoms.  30 g  0  . Esomeprazole Magnesium (NEXIUM PO) Take by mouth.      Marland Kitchen OVER THE COUNTER MEDICATION Candex for yeast      . potassium chloride SA (K-DUR,KLOR-CON) 20 MEQ tablet Take 1 tablet (20 mEq total) by mouth 2 (two) times daily.  180 tablet  3  . Probiotic Product (PROBIOTIC PO) Take by mouth.      . rosuvastatin (CRESTOR) 20 MG tablet Take 1 tablet (20 mg total) by mouth daily.  90 tablet  3  . valsartan-hydrochlorothiazide (DIOVAN-HCT) 320-25 MG per tablet Take 1 tablet by mouth daily.  90 tablet  3   No current facility-administered medications on file prior to visit.   Review of Systems  Constitutional: Negative for unusual diaphoresis or other sweats  HENT: Negative for ringing in  ear Eyes: Negative for double vision or worsening visual disturbance.  Respiratory: Negative for choking and stridor.   Gastrointestinal: Negative for vomiting or other signifcant bowel change Genitourinary: Negative for hematuria or decreased urine volume.  Musculoskeletal: Negative for other MSK pain or swelling Skin: Negative for color change and worsening wound.  Neurological: Negative for tremors and numbness other than noted  Psychiatric/Behavioral: Negative for decreased concentration or agitation other than above  ;    Objective:   Physical Exam BP 130/80  Pulse 83  Temp(Src) 98.2 F (36.8 C) (Oral)  Ht 5\' 2"  (1.575 m)  Wt 170 lb (77.111 kg)  BMI 31.09 kg/m2  SpO2 95% VS noted,  Constitutional: Pt appears well-developed, well-nourished.  HENT: Head: NCAT.  Right Ear: External ear normal.  Left Ear: External ear normal.  Eyes: . Pupils are equal, round, and reactive to light. Conjunctivae and EOM are normal Neck: Normal range of motion. Neck supple.  Cardiovascular: Normal rate and regular rhythm.   Pulmonary/Chest: Effort normal and breath sounds normal.  Abd:  Soft, NT, ND, + BS Neurological: Pt is alert. Not confused , motor grossly intact Skin: Skin is warm. < 1/2 cm silvery erythem superfic nontender lesion Psychiatric: Pt behavior is normal. No agitation.     Assessment & Plan:

## 2014-07-02 NOTE — Progress Notes (Signed)
Pre visit review using our clinic review tool, if applicable. No additional management support is needed unless otherwise documented below in the visit note. 

## 2014-07-07 NOTE — Assessment & Plan Note (Signed)
C/w dermatitis, no sign of infection or malignancy, for triam cr prn

## 2014-07-07 NOTE — Assessment & Plan Note (Signed)
Cont wt loss,  to f/u any worsening symptoms or concerns

## 2014-07-07 NOTE — Assessment & Plan Note (Signed)
stable overall by history and exam, recent data reviewed with pt, and pt to continue medical treatment as before,  to f/u any worsening symptoms or concerns Lab Results  Component Value Date   HGBA1C 6.5 04/04/2014   For f/u lab

## 2014-07-07 NOTE — Assessment & Plan Note (Signed)
stable overall by history and exam, recent data reviewed with pt, and pt to continue medical treatment as before,  to f/u any worsening symptoms or concerns BP Readings from Last 3 Encounters:  07/02/14 130/80  05/07/14 130/80  08/31/13 120/70

## 2014-07-08 ENCOUNTER — Other Ambulatory Visit: Payer: Self-pay

## 2014-07-08 DIAGNOSIS — Z1239 Encounter for other screening for malignant neoplasm of breast: Secondary | ICD-10-CM

## 2014-07-22 ENCOUNTER — Ambulatory Visit: Payer: 59

## 2014-08-07 ENCOUNTER — Telehealth: Payer: Self-pay | Admitting: Internal Medicine

## 2014-08-07 NOTE — Telephone Encounter (Signed)
Called the patient left msg. Labs ordered.

## 2014-08-07 NOTE — Telephone Encounter (Signed)
Orders are already placed  Please check by looking at the "order review" tab

## 2014-08-07 NOTE — Telephone Encounter (Signed)
Patient has appt 12/9 for cpe  She is requesting labs to be entered.  Please advise once entered.

## 2014-08-20 ENCOUNTER — Ambulatory Visit
Admission: RE | Admit: 2014-08-20 | Discharge: 2014-08-20 | Disposition: A | Discharge status: Home / Self Care | Payer: 59 | Source: Ambulatory Visit

## 2014-08-20 ENCOUNTER — Other Ambulatory Visit: Payer: Self-pay

## 2014-08-20 ENCOUNTER — Other Ambulatory Visit (INDEPENDENT_AMBULATORY_CARE_PROVIDER_SITE_OTHER): Payer: 59

## 2014-08-20 DIAGNOSIS — Z1231 Encounter for screening mammogram for malignant neoplasm of breast: Secondary | ICD-10-CM

## 2014-08-20 DIAGNOSIS — Z0000 Encounter for general adult medical examination without abnormal findings: Secondary | ICD-10-CM

## 2014-08-20 DIAGNOSIS — E119 Type 2 diabetes mellitus without complications: Secondary | ICD-10-CM

## 2014-08-20 LAB — URINALYSIS, ROUTINE W REFLEX MICROSCOPIC
Bilirubin Urine: NEGATIVE
Ketones, ur: NEGATIVE
Nitrite: NEGATIVE
PH: 6 (ref 5.0–8.0)
SPECIFIC GRAVITY, URINE: 1.01 (ref 1.000–1.030)
Total Protein, Urine: NEGATIVE
URINE GLUCOSE: NEGATIVE
Urobilinogen, UA: 0.2 (ref 0.0–1.0)

## 2014-08-20 LAB — CBC WITH DIFFERENTIAL/PLATELET
BASOS PCT: 0.5 % (ref 0.0–3.0)
Basophils Absolute: 0 10*3/uL (ref 0.0–0.1)
Eosinophils Absolute: 0.2 10*3/uL (ref 0.0–0.7)
Eosinophils Relative: 3.1 % (ref 0.0–5.0)
HCT: 38.7 % (ref 36.0–46.0)
HEMOGLOBIN: 12.7 g/dL (ref 12.0–15.0)
LYMPHS PCT: 34.4 % (ref 12.0–46.0)
Lymphs Abs: 2.5 10*3/uL (ref 0.7–4.0)
MCHC: 32.9 g/dL (ref 30.0–36.0)
MCV: 81.7 fl (ref 78.0–100.0)
MONOS PCT: 7.8 % (ref 3.0–12.0)
Monocytes Absolute: 0.6 10*3/uL (ref 0.1–1.0)
Neutro Abs: 3.9 10*3/uL (ref 1.4–7.7)
Neutrophils Relative %: 54.2 % (ref 43.0–77.0)
Platelets: 253 10*3/uL (ref 150.0–400.0)
RBC: 4.73 Mil/uL (ref 3.87–5.11)
RDW: 14.1 % (ref 11.5–15.5)
WBC: 7.3 10*3/uL (ref 4.0–10.5)

## 2014-08-20 LAB — BASIC METABOLIC PANEL
BUN: 15 mg/dL (ref 6–23)
CHLORIDE: 103 meq/L (ref 96–112)
CO2: 25 meq/L (ref 19–32)
Calcium: 9.6 mg/dL (ref 8.4–10.5)
Creatinine, Ser: 0.7 mg/dL (ref 0.4–1.2)
GFR: 88.27 mL/min (ref >60.00)
GLUCOSE: 101 mg/dL — AB (ref 70–99)
Potassium: 3.7 mEq/L (ref 3.5–5.1)
Sodium: 139 mEq/L (ref 135–145)

## 2014-08-20 LAB — LIPID PANEL
Cholesterol: 173 mg/dL (ref 0–200)
HDL: 48.7 mg/dL (ref >39.00)
LDL Cholesterol: 95 mg/dL (ref 0–99)
NonHDL: 124.3
TRIGLYCERIDES: 147 mg/dL (ref 0.0–149.0)
Total CHOL/HDL Ratio: 4
VLDL: 29.4 mg/dL (ref 0.0–40.0)

## 2014-08-20 LAB — HEPATIC FUNCTION PANEL
ALBUMIN: 4.5 g/dL (ref 3.5–5.2)
ALT: 34 U/L (ref 0–35)
AST: 29 U/L (ref 0–37)
Alkaline Phosphatase: 93 U/L (ref 39–117)
Bilirubin, Direct: 0.1 mg/dL (ref 0.0–0.3)
TOTAL PROTEIN: 7.4 g/dL (ref 6.0–8.3)
Total Bilirubin: 0.5 mg/dL (ref 0.2–1.2)

## 2014-08-20 LAB — TSH: TSH: 1.47 u[IU]/mL (ref 0.35–4.50)

## 2014-08-20 LAB — MICROALBUMIN / CREATININE URINE RATIO
CREATININE, U: 75.3 mg/dL
MICROALB/CREAT RATIO: 0.5 mg/g (ref 0.0–30.0)
Microalb, Ur: 0.4 mg/dL (ref 0.0–1.9)

## 2014-08-20 LAB — HEMOGLOBIN A1C: Hgb A1c MFr Bld: 6.7 % — ABNORMAL HIGH (ref 4.6–6.5)

## 2014-08-21 ENCOUNTER — Telehealth: Payer: Self-pay

## 2014-08-21 LAB — HM MAMMOGRAPHY

## 2014-08-21 MED ORDER — SULFAMETHOXAZOLE-TRIMETHOPRIM 800-160 MG PO TABS: 1.0000 | ORAL_TABLET | Freq: Two times a day (BID) | ORAL | Status: DC

## 2014-08-21 NOTE — Telephone Encounter (Signed)
The patient returned my call regarding her urine results and yes she is having symptoms, but wanted you to know she does take daily Nitrofurantoin 100 mg daily.  Her pharmacy is [facility] .  Call back number is [phone]

## 2014-08-21 NOTE — Telephone Encounter (Signed)
Ok to hold the nitrofurantoin  Please take all new medication as prescribed - the septra DS  Ok to restart the nitrofurantoin after

## 2014-08-21 NOTE — Telephone Encounter (Signed)
Patient informed antibiotic sent in to pharmacy and informed of PCP instructions.

## 2014-08-28 ENCOUNTER — Encounter: Payer: Self-pay | Admitting: Internal Medicine

## 2014-08-28 ENCOUNTER — Ambulatory Visit (INDEPENDENT_AMBULATORY_CARE_PROVIDER_SITE_OTHER): Payer: 59 | Admitting: Internal Medicine

## 2014-08-28 VITALS — BP 98/60 | HR 81 | Temp 98.1°F | Ht 62.0 in | Wt 176.0 lb

## 2014-08-28 DIAGNOSIS — I10 Essential (primary) hypertension: Secondary | ICD-10-CM

## 2014-08-28 DIAGNOSIS — L259 Unspecified contact dermatitis, unspecified cause: Secondary | ICD-10-CM | POA: Insufficient documentation

## 2014-08-28 DIAGNOSIS — Z23 Encounter for immunization: Secondary | ICD-10-CM

## 2014-08-28 DIAGNOSIS — E119 Type 2 diabetes mellitus without complications: Secondary | ICD-10-CM

## 2014-08-28 DIAGNOSIS — Z0000 Encounter for general adult medical examination without abnormal findings: Secondary | ICD-10-CM

## 2014-08-28 DIAGNOSIS — E042 Nontoxic multinodular goiter: Secondary | ICD-10-CM

## 2014-08-28 MED ORDER — METHYLPREDNISOLONE ACETATE 80 MG/ML IJ SUSP
80.0000 mg | Freq: Once | INTRAMUSCULAR | Status: AC
  Administered 2014-08-28: 80 mg via INTRAMUSCULAR

## 2014-08-28 NOTE — Patient Instructions (Addendum)
You had the older pneuomnia shot - Pneumovax today (with the second due in 5 yrs)  You had the steroid shot today for the rash  Your EKG was OK today, as well as the Lab work  Please continue all other medications as before, and refills have been done if requested.  Please have the pharmacy call with any other refills you may need.  Please continue your efforts at being more active, low cholesterol diet, and weight control.  You are otherwise up to date with prevention measures today.  Please keep your appointments with your specialists as you may have planned  Please return in 6 months, or sooner if needed, with Lab testing done 3-5 days before

## 2014-08-28 NOTE — Progress Notes (Signed)
Subjective:    Patient ID: [name] [name], female    DOB: Apr 28, 1956, 58 y.o.   MRN: [id number]  HPI    Here for wellness and f/u;  Overall doing ok;  Pt denies CP, worsening SOB, DOE, wheezing, orthopnea, PND, worsening LE edema, palpitations, dizziness or syncope.  Pt denies neurological change such as new headache, facial or extremity weakness.  Pt denies polydipsia, polyuria, or low sugar symptoms. Pt states overall good compliance with treatment and medications, good tolerability, and has been trying to follow lower cholesterol diet.  Pt denies worsening depressive symptoms, suicidal ideation or panic. No fever, night sweats, wt loss, loss of appetite, or other constitutional symptoms.  Pt states good ability with ADL's, has low fall risk, home safety reviewed and adequate, no other significant changes in hearing or vision, and only occasionally active with exercise. Just started wt watchers. Hard to lose wt.  Had recent flea bites to legs, now in her house as well, being treated.  Has also new contact derm like rash with itching in a peculiar area - "line like" about the right side/abd/left side only at approx t12 .  Past Medical History  Diagnosis Date  . ALLERGIC RHINITIS 04/06/2007  . COLONIC POLYPS 08/04/2007  . DIARRHEA, ACUTE 09/23/2010  . GASTRIC POLYP 08/04/2007  . GERD 08/04/2007  . Headache(784.0) 11/13/2008  . HYPERLIPIDEMIA 04/06/2007  . ISCHEMIC COLITIS, HX OF 07/06/2007  . Palpitations 03/10/2009  . Rosacea 11/30/2007  . UTI'S, RECURRENT 11/30/2007  . ANXIETY 04/06/2007  . GOITER, MULTINODULAR 03/10/2009  . THYROID NODULE, LEFT 11/04/2010  . CIN I (cervical intraepithelial neoplasia I)   . Fibroid   . HYPERTENSION 04/06/2007  . Osteopenia 12/2012    T score -1.1 FRAX 5.9%/0.3%  . Hx of blood clots   . Urinary frequency   . Lichen sclerosus 0277   Past Surgical History  Procedure Laterality Date  . Dermoid cyst  excision  1994    right ovary  . Uterine fibroids removed   1996  . Right foot neuroma  1988  . Removed skull cyst  1973  . Flexible sigmoidoscopy  11/16/95  . Appendectomy  2005  . Bladder tack    . Cystoscopy  2007  . Lt. pectoral/subclavicular abscess drainage  2006  . Colposcopy      reports that she has never smoked. She does not have any smokeless tobacco history on file. She reports that she drinks alcohol. She reports that she does not use illicit drugs. family history includes Breast cancer in her paternal aunt; Deep vein thrombosis in her brother; Diabetes in her mother; Heart disease in her father and mother; Hypertension in her mother; Lung cancer in her maternal grandmother and mother; Stroke in her brother and father. Allergies  Allergen Reactions  . Atorvastatin     REACTION: extreme muscle aches  . Cefuroxime Axetil     REACTION: DIARRHEA  . Ciprofloxacin     REACTION: diarrhea  . Latex     REACTION: rash  . Lipitor [Atorvastatin Calcium] Other (See Comments)    sensitive  . Moxifloxacin     REACTION: DIARRHEA   Current Outpatient Prescriptions on File Prior to Visit  Medication Sig Dispense Refill  . aspirin 81 MG EC tablet Take 81 mg by mouth daily.      . cetirizine (ZYRTEC) 10 MG tablet Take 10 mg by mouth daily.      . Esomeprazole Magnesium (NEXIUM PO) Take by mouth.    Marland Kitchen  OVER THE COUNTER MEDICATION Candex for yeast    . potassium chloride SA (K-DUR,KLOR-CON) 20 MEQ tablet Take 1 tablet (20 mEq total) by mouth 2 (two) times daily. 180 tablet 3  . Probiotic Product (PROBIOTIC PO) Take by mouth.    . rosuvastatin (CRESTOR) 20 MG tablet Take 1 tablet (20 mg total) by mouth daily. 90 tablet 3  . sulfamethoxazole-trimethoprim (SEPTRA DS) 800-160 MG per tablet Take 1 tablet by mouth 2 (two) times daily. 20 tablet 0  . valsartan-hydrochlorothiazide (DIOVAN-HCT) 320-25 MG per tablet Take 1 tablet by mouth daily. 90 tablet 3   No current facility-administered medications on file prior to visit.     Review of  Systems Constitutional: Negative for increased diaphoresis, other activity, appetite or other siginficant weight change  HENT: Negative for worsening hearing loss, ear pain, facial swelling, mouth sores and neck stiffness.   Eyes: Negative for other worsening pain, redness or visual disturbance.  Respiratory: Negative for shortness of breath and wheezing.   Cardiovascular: Negative for chest pain and palpitations.  Gastrointestinal: Negative for diarrhea, blood in stool, abdominal distention or other pain Genitourinary: Negative for hematuria, flank pain or change in urine volume.  Musculoskeletal: Negative for myalgias or other joint complaints.  Skin: Negative for color change and wound. Line liike rash such as waistband on track pants about the abd and sides Nontender Neurological: Negative for syncope and numbness. other than noted Hematological: Negative for adenopathy. or other swelling Psychiatric/Behavioral: Negative for hallucinations, self-injury, decreased concentration or other worsening agitation.      Objective:   Physical Exam BP 98/60 mmHg  Pulse 81  Temp(Src) 98.1 F (36.7 C) (Oral)  Ht 5\' 2"  (1.575 m)  Wt 176 lb (79.833 kg)  BMI 32.18 kg/m2  SpO2 94% VS noted,  Constitutional: Pt is oriented to person, place, and time. Appears well-developed and well-nourished.  Head: Normocephalic and atraumatic.  Right Ear: External ear normal.  Left Ear: External ear normal.  Nose: Nose normal.  Mouth/Throat: Oropharynx is clear and moist.  Eyes: Conjunctivae and EOM are normal. Pupils are equal, round, and reactive to light.  Neck: Normal range of motion. Neck supple. No JVD present. No tracheal deviation present.  Cardiovascular: Normal rate, regular rhythm, normal heart sounds and intact distal pulses.   Pulmonary/Chest: Effort normal and breath sounds without rales or wheezing  Abdominal: Soft. Bowel sounds are normal. NT. No HSM  Musculoskeletal: Normal range of motion.  Exhibits no edema.  Lymphadenopathy:  Has no cervical adenopathy.  Neurological: Pt is alert and oriented to person, place, and time. Pt has normal reflexes. No cranial nerve deficit. Motor grossly intact Skin: Skin is warm and dry. Rash to ant abdomen in peculiar linear fashion at the waistline both left, anterior and right, but no rash to back Psychiatric:  Has mild anxious mood and affect. Behavior is normal.   Wt Readings from Last 3 Encounters:  08/28/14 176 lb (79.833 kg)  07/02/14 170 lb (77.111 kg)  05/07/14 175 lb 2 oz (79.436 kg)       Assessment & Plan:

## 2014-08-28 NOTE — Progress Notes (Signed)
Pre visit review using our clinic review tool, if applicable. No additional management support is needed unless otherwise documented below in the visit note. 

## 2014-08-28 NOTE — Assessment & Plan Note (Signed)
stable overall by history and exam, recent data reviewed with pt, and pt to continue medical treatment as before,  to f/u any worsening symptoms or concerns Lab Results  Component Value Date   HGBA1C 6.7* 08/20/2014   To cont diet only

## 2014-08-28 NOTE — Assessment & Plan Note (Signed)
Declines thyroid u/s this yr, will readdress in future

## 2014-08-28 NOTE — Assessment & Plan Note (Signed)
stable overall by history and exam, recent data reviewed with pt, and pt to continue medical treatment as before,  to f/u any worsening symptoms or concerns BP Readings from Last 3 Encounters:  08/28/14 98/60  07/02/14 130/80  05/07/14 130/80

## 2014-09-01 NOTE — Assessment & Plan Note (Signed)

## 2014-09-01 NOTE — Assessment & Plan Note (Signed)
For depomedrolIM, topical steroid prn,  to f/u any worsening symptoms or concerns

## 2014-09-04 ENCOUNTER — Ambulatory Visit (INDEPENDENT_AMBULATORY_CARE_PROVIDER_SITE_OTHER): Payer: 59 | Admitting: Gynecology

## 2014-09-04 ENCOUNTER — Other Ambulatory Visit (HOSPITAL_COMMUNITY)
Admission: RE | Admit: 2014-09-04 | Discharge: 2014-09-04 | Disposition: A | Discharge status: Home / Self Care | Payer: 59 | Source: Ambulatory Visit | Attending: Gynecology | Admitting: Gynecology

## 2014-09-04 ENCOUNTER — Encounter: Payer: Self-pay | Admitting: Gynecology

## 2014-09-04 VITALS — BP 120/70 | Ht 62.0 in | Wt 172.0 lb

## 2014-09-04 DIAGNOSIS — N9089 Other specified noninflammatory disorders of vulva and perineum: Secondary | ICD-10-CM

## 2014-09-04 DIAGNOSIS — Z01419 Encounter for gynecological examination (general) (routine) without abnormal findings: Secondary | ICD-10-CM | POA: Insufficient documentation

## 2014-09-04 DIAGNOSIS — Z1151 Encounter for screening for human papillomavirus (HPV): Secondary | ICD-10-CM | POA: Insufficient documentation

## 2014-09-04 DIAGNOSIS — M8580 Other specified disorders of bone density and structure, unspecified site: Secondary | ICD-10-CM

## 2014-09-04 MED ORDER — CLOBETASOL PROPIONATE 0.05 % EX CREA: TOPICAL_CREAM | CUTANEOUS | Status: DC

## 2014-09-04 NOTE — Addendum Note (Signed)
Addended by: Nelva Nay on: 09/04/2014 10:40 AM   Modules accepted: Orders, SmartSet

## 2014-09-04 NOTE — Patient Instructions (Signed)
Apply the clobetasol cream nightly for one month and then wean off. Use it intermittently as needed for irritation.  You may obtain a copy of any labs that were done today by logging onto MyChart as outlined in the instructions provided with your AVS (after visit summary). The office will not call with normal lab results but certainly if there are any significant abnormalities then we will contact you.   Health Maintenance, Female A healthy lifestyle and preventative care can promote health and wellness.  Maintain regular health, dental, and eye exams.  Eat a healthy diet. Foods like vegetables, fruits, whole grains, low-fat dairy products, and lean protein foods contain the nutrients you need without too many calories. Decrease your intake of foods high in solid fats, added sugars, and salt. Get information about a proper diet from your caregiver, if necessary.  Regular physical exercise is one of the most important things you can do for your health. Most adults should get at least 150 minutes of moderate-intensity exercise (any activity that increases your heart rate and causes you to sweat) each week. In addition, most adults need muscle-strengthening exercises on 2 or more days a week.   Maintain a healthy weight. The body mass index (BMI) is a screening tool to identify possible weight problems. It provides an estimate of body fat based on height and weight. Your caregiver can help determine your BMI, and can help you achieve or maintain a healthy weight. For adults 20 years and older:  A BMI below 18.5 is considered underweight.  A BMI of 18.5 to 24.9 is normal.  A BMI of 25 to 29.9 is considered overweight.  A BMI of 30 and above is considered obese.  Maintain normal blood lipids and cholesterol by exercising and minimizing your intake of saturated fat. Eat a balanced diet with plenty of fruits and vegetables. Blood tests for lipids and cholesterol should begin at age 43 and be  repeated every 5 years. If your lipid or cholesterol levels are high, you are over 50, or you are a high risk for heart disease, you may need your cholesterol levels checked more frequently.Ongoing high lipid and cholesterol levels should be treated with medicines if diet and exercise are not effective.  If you smoke, find out from your caregiver how to quit. If you do not use tobacco, do not start.  Lung cancer screening is recommended for adults aged 62 80 years who are at high risk for developing lung cancer because of a history of smoking. Yearly low-dose computed tomography (CT) is recommended for people who have at least a 30-pack-year history of smoking and are a current smoker or have quit within the past 15 years. A pack year of smoking is smoking an average of 1 pack of cigarettes a day for 1 year (for example: 1 pack a day for 30 years or 2 packs a day for 15 years). Yearly screening should continue until the smoker has stopped smoking for at least 15 years. Yearly screening should also be stopped for people who develop a health problem that would prevent them from having lung cancer treatment.  If you are pregnant, do not drink alcohol. If you are breastfeeding, be very cautious about drinking alcohol. If you are not pregnant and choose to drink alcohol, do not exceed 1 drink per day. One drink is considered to be 12 ounces (355 mL) of beer, 5 ounces (148 mL) of wine, or 1.5 ounces (44 mL) of liquor.  Avoid use  of street drugs. Do not share needles with anyone. Ask for help if you need support or instructions about stopping the use of drugs.  High blood pressure causes heart disease and increases the risk of stroke. Blood pressure should be checked at least every 1 to 2 years. Ongoing high blood pressure should be treated with medicines, if weight loss and exercise are not effective.  If you are 62 to 58 years old, ask your caregiver if you should take aspirin to prevent strokes.  Diabetes  screening involves taking a blood sample to check your fasting blood sugar level. This should be done once every 3 years, after age 46, if you are within normal weight and without risk factors for diabetes. Testing should be considered at a younger age or be carried out more frequently if you are overweight and have at least 1 risk factor for diabetes.  Breast cancer screening is essential preventative care for women. You should practice "breast self-awareness." This means understanding the normal appearance and feel of your breasts and may include breast self-examination. Any changes detected, no matter how small, should be reported to a caregiver. Women in their 51s and 30s should have a clinical breast exam (CBE) by a caregiver as part of a regular health exam every 1 to 3 years. After age 25, women should have a CBE every year. Starting at age 28, women should consider having a mammogram (breast X-ray) every year. Women who have a family history of breast cancer should talk to their caregiver about genetic screening. Women at a high risk of breast cancer should talk to their caregiver about having an MRI and a mammogram every year.  Breast cancer gene (BRCA)-related cancer risk assessment is recommended for women who have family members with BRCA-related cancers. BRCA-related cancers include breast, ovarian, tubal, and peritoneal cancers. Having family members with these cancers may be associated with an increased risk for harmful changes (mutations) in the breast cancer genes BRCA1 and BRCA2. Results of the assessment will determine the need for genetic counseling and BRCA1 and BRCA2 testing.  The Pap test is a screening test for cervical cancer. Women should have a Pap test starting at age 85. Between ages 59 and 66, Pap tests should be repeated every 2 years. Beginning at age 23, you should have a Pap test every 3 years as long as the past 3 Pap tests have been normal. If you had a hysterectomy for a  problem that was not cancer or a condition that could lead to cancer, then you no longer need Pap tests. If you are between ages 5 and 46, and you have had normal Pap tests going back 10 years, you no longer need Pap tests. If you have had past treatment for cervical cancer or a condition that could lead to cancer, you need Pap tests and screening for cancer for at least 20 years after your treatment. If Pap tests have been discontinued, risk factors (such as a new sexual partner) need to be reassessed to determine if screening should be resumed. Some women have medical problems that increase the chance of getting cervical cancer. In these cases, your caregiver may recommend more frequent screening and Pap tests.  The human papillomavirus (HPV) test is an additional test that may be used for cervical cancer screening. The HPV test looks for the virus that can cause the cell changes on the cervix. The cells collected during the Pap test can be tested for HPV. The HPV test  could be used to screen women aged 4 years and older, and should be used in women of any age who have unclear Pap test results. After the age of 39, women should have HPV testing at the same frequency as a Pap test.  Colorectal cancer can be detected and often prevented. Most routine colorectal cancer screening begins at the age of 27 and continues through age 35. However, your caregiver may recommend screening at an earlier age if you have risk factors for colon cancer. On a yearly basis, your caregiver may provide home test kits to check for hidden blood in the stool. Use of a small camera at the end of a tube, to directly examine the colon (sigmoidoscopy or colonoscopy), can detect the earliest forms of colorectal cancer. Talk to your caregiver about this at age 75, when routine screening begins. Direct examination of the colon should be repeated every 5 to 10 years through age 21, unless early forms of pre-cancerous polyps or small growths  are found.  Hepatitis C blood testing is recommended for all people born from [birth year] through 1965 and any individual with known risks for hepatitis C.  Practice safe sex. Use condoms and avoid high-risk sexual practices to reduce the spread of sexually transmitted infections (STIs). Sexually active women aged 49 and younger should be checked for Chlamydia, which is a common sexually transmitted infection. Older women with new or multiple partners should also be tested for Chlamydia. Testing for other STIs is recommended if you are sexually active and at increased risk.  Osteoporosis is a disease in which the bones lose minerals and strength with aging. This can result in serious bone fractures. The risk of osteoporosis can be identified using a bone density scan. Women ages 62 and over and women at risk for fractures or osteoporosis should discuss screening with their caregivers. Ask your caregiver whether you should be taking a calcium supplement or vitamin D to reduce the rate of osteoporosis.  Menopause can be associated with physical symptoms and risks. Hormone replacement therapy is available to decrease symptoms and risks. You should talk to your caregiver about whether hormone replacement therapy is right for you.  Use sunscreen. Apply sunscreen liberally and repeatedly throughout the day. You should seek shade when your shadow is shorter than you. Protect yourself by wearing long sleeves, pants, a wide-brimmed hat, and sunglasses year round, whenever you are outdoors.  Notify your caregiver of new moles or changes in moles, especially if there is a change in shape or color. Also notify your caregiver if a mole is larger than the size of a pencil eraser.  Stay current with your immunizations. Document Released: 03/22/2011 Document Revised: 01/01/2013 Document Reviewed: 03/22/2011 [facility] Patient Information 2014 [place].

## 2014-09-04 NOTE — Progress Notes (Signed)
[name] [name] Feb 04, 1956 [id number]        58 y.o.  G0P0 for annual exam.  Several issues noted below.  Past medical history,surgical history, problem list, medications, allergies, family history and social history were all reviewed and documented as reviewed in the EPIC chart.  ROS:  12 system ROS performed with pertinent positives and negatives included in the history, assessment and plan.   Additional significant findings :  none   Exam: Kim Counsellor Vitals:   09/04/14 0942  BP: 120/70  Height: 5\' 2"  (1.575 m)  Weight: 172 lb (78.019 kg)   General appearance:  Normal affect, orientation and appearance. Skin: Grossly normal HEENT: Without gross lesions.  No cervical or supraclavicular adenopathy. Thyroid normal.  Lungs:  Clear without wheezing, rales or rhonchi Cardiac: RR, without RMG Abdominal:  Soft, nontender, without masses, guarding, rebound, organomegaly or hernia Breasts:  Examined lying and sitting without masses, retractions, discharge or axillary adenopathy. Pelvic:  Ext/BUS/vagina generalized atrophic changes. Slight whitish change posterior fourchette/perineal body. No specific lesions or skin breaks.  Cervix atrophic. Pap/HPV  Uterus anteverted, normal size, shape and contour, midline and mobile nontender   Adnexa  Without masses or tenderness    Anus and perineum  Normal   Rectovaginal  Normal sphincter tone without palpated masses or tenderness.    Assessment/Plan:  58 y.o. G0P0 female for annual exam.   1. Pruritic area perineal body. Biopsy last year showed hyperkeratotic epithelium with differential to include lichen sclerosis/dermatitis. Has been using Temovate cream but is using it once weekly. Receives some benefit. Reviewed with her that I think she needs to be more aggressive as far as using the cream nightly for one month and then weaning off. Use it intermittently after that for symptoms at one to two-week nightly intervals. Follow up if this  does not control her symptoms. 2. Osteopenia. DEXA 12/2012 T score -1.1 FRAX 5.9%/0.3%. Repeat DEXA in the next year or 2. Increased calcium and vitamin D recommendations reviewed. 3. Pap smear 2012.  Pap/HPV today. History of CIN-1 1994. Normal Pap smears since then. Plan repeat Pap smear at 3-5 year interval assuming this Pap smear is normal. 4. Postmenopausal/atrophic genital changes. Status post exploratory laparotomy with dermoid right ovary and endometrioma left ovary 1991. Myomectomy with excision of dermoid left ovary 1994.  Without significant symptoms of hot flashes, night sweats, vaginal dryness. Not sexually active. No vaginal bleeding. Continue to monitor. Report any vaginal bleeding. 5. Mammography 08/2014. Continue with annual mammography. SBE monthly reviewed. 6. Colonoscopy 2008. Reported repeat interval 10 years. 7. Health maintenance. No routine blood work done as she reports this done at her primary physician's office. Follow up 1 year, sooner as needed.     Anastasio Auerbach MD, 10:10 AM 09/04/2014

## 2014-09-05 LAB — CYTOLOGY - PAP

## 2014-12-05 ENCOUNTER — Ambulatory Visit: Payer: Self-pay | Admitting: Internal Medicine

## 2014-12-06 ENCOUNTER — Ambulatory Visit (INDEPENDENT_AMBULATORY_CARE_PROVIDER_SITE_OTHER): Payer: 59 | Admitting: Internal Medicine

## 2014-12-06 ENCOUNTER — Encounter: Payer: Self-pay | Admitting: Internal Medicine

## 2014-12-06 ENCOUNTER — Other Ambulatory Visit: Payer: 59

## 2014-12-06 VITALS — BP 130/70 | HR 64 | Temp 97.9°F | Ht 62.0 in | Wt 179.5 lb

## 2014-12-06 DIAGNOSIS — R42 Dizziness and giddiness: Secondary | ICD-10-CM

## 2014-12-06 DIAGNOSIS — R300 Dysuria: Secondary | ICD-10-CM

## 2014-12-06 DIAGNOSIS — J309 Allergic rhinitis, unspecified: Secondary | ICD-10-CM

## 2014-12-06 DIAGNOSIS — R350 Frequency of micturition: Secondary | ICD-10-CM

## 2014-12-06 DIAGNOSIS — R3915 Urgency of urination: Secondary | ICD-10-CM

## 2014-12-06 LAB — POCT URINE QUALITATIVE DIPSTICK NITRITE
GLUCOSE UR: NEGATIVE
Nitrite, UA: NEGATIVE
PH UA: 7 (ref 4.5–8.0)
PROTEIN UR: NEGATIVE
SPEC GRAV UA: 1 — AB (ref 1.005–1.030)
UROBILINOGEN UA: NORMAL
WBC UA: NEGATIVE

## 2014-12-06 MED ORDER — MECLIZINE HCL 25 MG PO TABS: 25.0000 mg | ORAL_TABLET | Freq: Three times a day (TID) | ORAL | Status: DC | PRN

## 2014-12-06 MED ORDER — SULFAMETHOXAZOLE-TRIMETHOPRIM 800-160 MG PO TABS
1.0000 | ORAL_TABLET | Freq: Two times a day (BID) | ORAL | Status: DC
Start: 2014-12-06 — End: 2015-07-09

## 2014-12-06 NOTE — Patient Instructions (Addendum)
Plain Mucinex (NOT D) for thick secretions ;force NON dairy fluids .   Nasal cleansing in the shower as discussed with lather of mild shampoo.After 10 seconds wash off lather while  exhaling through nostrils. Make sure that all residual soap is removed to prevent irritation.  Flonase OR Nasacort AQ 1 spray in each nostril twice a day as needed. Use the "crossover" technique into opposite nostril spraying toward opposite ear @ 45 degree angle, not straight up into nostril.  Plain Allegra (NOT D )  160 daily , Loratidine 10 mg , OR Zyrtec 10 mg @ bedtime  as needed for itchy eyes & sneezing.  Drink as much nondairy fluids as possible. Avoid spicy foods or alcohol as  these may aggravate the bladder. Do not take decongestants. Avoid narcotics if possible.

## 2014-12-06 NOTE — Progress Notes (Signed)
Pre visit review using our clinic review tool, if applicable. No additional management support is needed unless otherwise documented below in the visit note. 

## 2014-12-06 NOTE — Progress Notes (Signed)
   Subjective:    Patient ID: [name] [name], female    DOB: Apr 25, 1956, 59 y.o.   MRN: [id number]  HPI Symptoms began 12/03/13 as bladder cramping and spasm. This was associated with urgency and frequency. She's had associated chills and sweats.  She has a past history of recurrent urinary tract infections for which she was evaluated by Dr McDiarmid,Urologist. She was found to have 2 ureters on the right.  There is a PMH of pyelonephritis. Review of chart revealed no urine culture results.The only Urology encounter was in 2008 with Dr Terance Hart; no record included.   She's had some dizziness and nausea the last several days which she relates to allergies. She has itchy eyes and pressure in the right ear. She has some head congestion.   Review of Systems She denies dysuria, pyuria, or frank fever.  She is not having sneezing.Frontal headache, facial pain , nasal purulence, dental pain, sore throat , or otic discharge denied.  .    Objective:   Physical Exam  Pertinent positive findings include: There is erythema of the nares particularly of the septum. Right tympanic membranes dull.  She described dizziness and lightheadedness after rising from the spine position.  She had no nystagmus. She does have some mild crepitus of the knees.   General appearance :adequately nourished; in no distress. Eyes: No conjunctival inflammation or scleral icterus is present. Oral exam:  Lips and gums are healthy appearing.There is no oropharyngeal erythema or exudate noted. Dental hygiene is good. Heart:  Normal rate and regular rhythm. S1 and S2 normal without gallop, murmur, click, rub or other extra sounds   Lungs:Chest clear to auscultation; no wheezes, rhonchi,rales ,or rubs present.No increased work of breathing.  Abdomen: bowel sounds normal, soft and non-tender without masses, organomegaly or hernias noted.  No guarding or rebound. No flank tenderness to percussion. Vascular : all pulses equal  ; no bruits present. Skin:Warm & dry.  Intact without suspicious lesions or rashes ; no tenting or jaundice  Lymphatic: No lymphadenopathy is noted about the head, neck, axilla Neuro: Strength, tone & DTRs normal.        Assessment & Plan:  #1 urgency and frequency; rule out urinary tract infection #2 PMH of recurrent UTIs & pyelonephritis w/o accessible records in chart #2 extrinsic rhinoconjunctivitis with dizziness and nausea.Clinically rhinosinusitis not present.  Plan: See orders and recommendations

## 2014-12-10 LAB — URINE CULTURE: Colony Count: >=100000

## 2015-04-26 ENCOUNTER — Other Ambulatory Visit: Payer: Self-pay | Admitting: Internal Medicine

## 2015-07-04 ENCOUNTER — Ambulatory Visit: Payer: 59 | Admitting: Internal Medicine

## 2015-07-09 ENCOUNTER — Encounter: Payer: Self-pay | Admitting: Internal Medicine

## 2015-07-09 ENCOUNTER — Ambulatory Visit (INDEPENDENT_AMBULATORY_CARE_PROVIDER_SITE_OTHER): Payer: 59 | Admitting: Internal Medicine

## 2015-07-09 VITALS — BP 128/78 | HR 69 | Wt 178.0 lb

## 2015-07-09 DIAGNOSIS — E119 Type 2 diabetes mellitus without complications: Secondary | ICD-10-CM | POA: Diagnosis not present

## 2015-07-09 DIAGNOSIS — I10 Essential (primary) hypertension: Secondary | ICD-10-CM

## 2015-07-09 DIAGNOSIS — E785 Hyperlipidemia, unspecified: Secondary | ICD-10-CM | POA: Diagnosis not present

## 2015-07-09 NOTE — Patient Instructions (Signed)
Please continue all other medications as before, and refills have been done if requested.  Please have the pharmacy call with any other refills you may need.  Please continue your efforts at being more active, low cholesterol diet, and weight control  Please keep your appointments with your specialists as you may have planned  Your form was filled out today  You will be contacted regarding the referral for: Diabetes education  class

## 2015-07-09 NOTE — Assessment & Plan Note (Signed)
stable overall by history and exam, recent data reviewed with pt, and pt to continue medical treatment as before,  to f/u any worsening symptoms or concerns Lab Results  Component Value Date   HGBA1C 6.7* 08/20/2014   To cont to work on wt loss with diet, exercise, also for Dm education/diet education, form filled out

## 2015-07-09 NOTE — Assessment & Plan Note (Signed)
stable overall by history and exam, recent data reviewed with pt, and pt to continue medical treatment as before,  to f/u any worsening symptoms or concerns Lab Results  Component Value Date   Drew 95 08/20/2014

## 2015-07-09 NOTE — Progress Notes (Signed)
Pre visit review using our clinic review tool, if applicable. No additional management support is needed unless otherwise documented below in the visit note. 

## 2015-07-09 NOTE — Assessment & Plan Note (Signed)
stable overall by history and exam, recent data reviewed with pt, and pt to continue medical treatment as before,  to f/u any worsening symptoms or concerns BP Readings from Last 3 Encounters:  07/09/15 128/78  12/06/14 130/70  09/04/14 120/70

## 2015-07-09 NOTE — Progress Notes (Signed)
Subjective:    Patient ID: [name] [name], female    DOB: 1956-07-01, 59 y.o.   MRN: [id number]  HPI  Here to f/u;  Pt denies Chest pain, worsening SOB, DOE, wheezing, orthopnea, PND, worsening LE edema, palpitations, dizziness or syncope.  Pt denies neurological change such as new headache, facial or extremity weakness.  Pt denies polydipsia, polyuria, or low sugar symptoms. Pt states overall good compliance with treatment and medications, good tolerability, and has been trying to follow appropriate diet.  Pt denies worsening depressive symptoms, suicidal ideation or panic. No fever, night sweats, wt loss, loss of appetite, or other constitutional symptoms. Wt Readings from Last 3 Encounters:  07/09/15 178 lb (80.74 kg)  12/06/14 179 lb 8 oz (81.421 kg)  09/04/14 172 lb (78.019 kg)  has a new dog, plans to walk more besides walking   Has not been able to lose wt despite wt watchers and trying to be active with an app on her phone to count the daily steps at work.  Has insurance form for work , needs further documented recommendation in order to get her reduced health insurance rate Past Medical History  Diagnosis Date  . ALLERGIC RHINITIS 04/06/2007  . COLONIC POLYPS 08/04/2007  . DIARRHEA, ACUTE 09/23/2010  . GASTRIC POLYP 08/04/2007  . GERD 08/04/2007  . Headache(784.0) 11/13/2008  . HYPERLIPIDEMIA 04/06/2007  . ISCHEMIC COLITIS, HX OF 07/06/2007  . Palpitations 03/10/2009  . Rosacea 11/30/2007  . UTI'S, RECURRENT 11/30/2007  . ANXIETY 04/06/2007  . GOITER, MULTINODULAR 03/10/2009  . THYROID NODULE, LEFT 11/04/2010  . CIN I (cervical intraepithelial neoplasia I)   . Fibroid   . HYPERTENSION 04/06/2007  . Osteopenia 12/2012    T score -1.1 FRAX 5.9%/0.3%  . Hx of blood clots   . Urinary frequency   . Lichen sclerosus 4098  . Arthritis    Past Surgical History  Procedure Laterality Date  . Dermoid cyst  excision  1994    right ovary  . Uterine fibroids removed  1996  . Right foot  neuroma  1988  . Removed skull cyst  1973  . Flexible sigmoidoscopy  11/16/95  . Appendectomy  2005  . Bladder tack    . Cystoscopy  2007  . Lt. pectoral/subclavicular abscess drainage  2006  . Colposcopy      reports that she has never smoked. She does not have any smokeless tobacco history on file. She reports that she drinks alcohol. She reports that she does not use illicit drugs. family history includes Breast cancer in her paternal aunt; Deep vein thrombosis in her brother; Diabetes in her mother; Emphysema in her father; Heart disease in her father and mother; Hypertension in her mother; Lung cancer in her maternal grandmother, maternal uncle, and mother; Stroke in her brother and father. Allergies  Allergen Reactions  . Atorvastatin     REACTION: extreme muscle aches  . Cefuroxime Axetil     REACTION: DIARRHEA  . Ciprofloxacin     REACTION: diarrhea  . Latex     REACTION: rash  . Lipitor [Atorvastatin Calcium] Other (See Comments)    sensitive  . Moxifloxacin     REACTION: DIARRHEA   Current Outpatient Prescriptions on File Prior to Visit  Medication Sig Dispense Refill  . aspirin 81 MG EC tablet Take 81 mg by mouth daily.      . cetirizine (ZYRTEC) 10 MG tablet Take 10 mg by mouth daily.      . clobetasol cream (  TEMOVATE) 0.05 % Apply nightly as needed for irritation 30 g 2  . dextromethorphan-guaiFENesin (MUCINEX DM) 30-600 MG per 12 hr tablet Take 1 tablet by mouth 2 (two) times daily.    . fluticasone (FLONASE) 50 MCG/ACT nasal spray Place into both nostrils daily.    . Multiple Vitamins-Minerals (MULTIVITAMIN PO) Take by mouth daily.    . potassium chloride SA (K-DUR,KLOR-CON) 20 MEQ tablet Take 1 tablet by mouth two  times daily 180 tablet 1  . rosuvastatin (CRESTOR) 20 MG tablet Take 1 tablet by mouth  daily 90 tablet 1  . valsartan-hydrochlorothiazide (DIOVAN-HCT) 320-25 MG per tablet Take 1 tablet by mouth  daily 90 tablet 1  . meclizine (ANTIVERT) 25 MG tablet  Take 1 tablet (25 mg total) by mouth 3 (three) times daily as needed for dizziness. (Patient not taking: Reported on 07/09/2015) 15 tablet 0  . phenazopyridine (PYRIDIUM) 97 MG tablet Take 95 mg by mouth 3 (three) times daily as needed for pain.     No current facility-administered medications on file prior to visit.    Review of Systems  Constitutional: Negative for unusual diaphoresis or night sweats HENT: Negative for ringing in ear or discharge Eyes: Negative for double vision or worsening visual disturbance.  Respiratory: Negative for choking and stridor.   Gastrointestinal: Negative for vomiting or other signifcant bowel change Genitourinary: Negative for hematuria or change in urine volume.  Musculoskeletal: Negative for other MSK pain or swelling Skin: Negative for color change and worsening wound.  Neurological: Negative for tremors and numbness other than noted  Psychiatric/Behavioral: Negative for decreased concentration or agitation other than above       Objective:   Physical Exam BP 128/78 mmHg  Pulse 69  Wt 178 lb (80.74 kg)  SpO2 97% VS noted,  Constitutional: Pt appears in no significant distress HENT: Head: NCAT.  Right Ear: External ear normal.  Left Ear: External ear normal.  Eyes: . Pupils are equal, round, and reactive to light. Conjunctivae and EOM are normal Neck: Normal range of motion. Neck supple.  Cardiovascular: Normal rate and regular rhythm.   Pulmonary/Chest: Effort normal and breath sounds without rales or wheezing.  Neurological: Pt is alert. Not confused , motor grossly intact Skin: Skin is warm. No rash, no LE edema Psychiatric: Pt behavior is normal. No agitation.     Assessment & Plan:

## 2015-07-15 ENCOUNTER — Telehealth: Payer: Self-pay | Admitting: Internal Medicine

## 2015-07-15 DIAGNOSIS — E119 Type 2 diabetes mellitus without complications: Secondary | ICD-10-CM

## 2015-07-15 DIAGNOSIS — Z0000 Encounter for general adult medical examination without abnormal findings: Secondary | ICD-10-CM

## 2015-07-15 NOTE — Telephone Encounter (Signed)
pls advise

## 2015-07-15 NOTE — Telephone Encounter (Signed)
Patient is scheduled for her physical the end of December and she is wondering if she can get her labs done ahead of time. Please advise Best number to call her is [phone]

## 2015-07-15 NOTE — Telephone Encounter (Signed)
Labs orderd

## 2015-07-15 NOTE — Telephone Encounter (Signed)
Notified pt md ok labs prior to CPX,.../lmb

## 2015-07-21 ENCOUNTER — Other Ambulatory Visit: Payer: Self-pay

## 2015-07-21 DIAGNOSIS — Z1231 Encounter for screening mammogram for malignant neoplasm of breast: Secondary | ICD-10-CM

## 2015-07-22 ENCOUNTER — Encounter: Payer: 59 | Attending: Internal Medicine

## 2015-07-22 VITALS — Ht 61.5 in | Wt 177.8 lb

## 2015-07-22 DIAGNOSIS — E119 Type 2 diabetes mellitus without complications: Secondary | ICD-10-CM | POA: Diagnosis not present

## 2015-07-22 DIAGNOSIS — Z713 Dietary counseling and surveillance: Secondary | ICD-10-CM | POA: Insufficient documentation

## 2015-07-22 NOTE — Progress Notes (Signed)

## 2015-07-29 DIAGNOSIS — E119 Type 2 diabetes mellitus without complications: Secondary | ICD-10-CM

## 2015-07-30 NOTE — Progress Notes (Signed)

## 2015-08-05 DIAGNOSIS — E119 Type 2 diabetes mellitus without complications: Secondary | ICD-10-CM

## 2015-08-07 ENCOUNTER — Telehealth: Payer: Self-pay | Admitting: *Deleted

## 2015-08-07 NOTE — Telephone Encounter (Signed)
Notified pt with md response.../lmb 

## 2015-08-07 NOTE — Telephone Encounter (Signed)
Receive call pt states she is newly diabetic and was reading that if you have diabetes that people shouldn't take crestor. Pt states before she get medication refill wanting to get md advisement...[name] [name]

## 2015-08-07 NOTE — Progress Notes (Signed)
Patient was seen on 08/05/15 for the third of a series of three diabetes self-management courses at the [facility].   Catalina Gravel the amount of activity recommended for healthy living . Describe activities suitable for individual needs . Identify ways to regularly incorporate activity into daily life . Identify barriers to activity and ways to over come these barriers  Identify diabetes medications being personally used and their primary action for lowering glucose and possible side effects . Describe role of stress on blood glucose and develop strategies to address psychosocial issues . Identify diabetes complications and ways to prevent them  Explain how to manage diabetes during illness . Evaluate success in meeting personal goal . Establish 2-3 goals that they will plan to diligently work on until they return for the  41-month follow-up visit  Goals:   I will count my carb choices at most meals and snacks  I will be active 30 minutes or more 3 times a week  Your patient has identified these potential barriers to change:  Time  Your patient has identified their diabetes self-care support plan as  On-line Resources Plan:  Attend Optional Core 4 in 4 months

## 2015-08-07 NOTE — Telephone Encounter (Signed)
There have been conflicting reports on possible elevated blood sugars being assoc with statin use.  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

## 2015-08-25 ENCOUNTER — Ambulatory Visit
Admission: RE | Admit: 2015-08-25 | Discharge: 2015-08-25 | Disposition: A | Discharge status: Home / Self Care | Payer: 59 | Source: Ambulatory Visit

## 2015-08-25 DIAGNOSIS — Z1231 Encounter for screening mammogram for malignant neoplasm of breast: Secondary | ICD-10-CM

## 2015-08-29 ENCOUNTER — Encounter: Payer: Self-pay | Admitting: Internal Medicine

## 2015-09-05 ENCOUNTER — Other Ambulatory Visit (INDEPENDENT_AMBULATORY_CARE_PROVIDER_SITE_OTHER): Payer: 59

## 2015-09-05 DIAGNOSIS — Z0000 Encounter for general adult medical examination without abnormal findings: Secondary | ICD-10-CM

## 2015-09-05 DIAGNOSIS — Z0189 Encounter for other specified special examinations: Secondary | ICD-10-CM

## 2015-09-05 DIAGNOSIS — E119 Type 2 diabetes mellitus without complications: Secondary | ICD-10-CM | POA: Diagnosis not present

## 2015-09-05 LAB — TSH: TSH: 1.44 u[IU]/mL (ref 0.35–4.50)

## 2015-09-05 LAB — URINALYSIS, ROUTINE W REFLEX MICROSCOPIC
BILIRUBIN URINE: NEGATIVE
HGB URINE DIPSTICK: NEGATIVE
KETONES UR: NEGATIVE
LEUKOCYTES UA: NEGATIVE
NITRITE: NEGATIVE
PH: 6 (ref 5.0–8.0)
Specific Gravity, Urine: 1.01 (ref 1.000–1.030)
Total Protein, Urine: NEGATIVE
UROBILINOGEN UA: 0.2 (ref 0.0–1.0)
Urine Glucose: NEGATIVE
WBC UA: NONE SEEN (ref 0–?)

## 2015-09-05 LAB — LIPID PANEL
CHOLESTEROL: 141 mg/dL (ref 0–200)
HDL: 43.2 mg/dL (ref >39.00)
LDL Cholesterol: 71 mg/dL (ref 0–99)
NonHDL: 98.13
Total CHOL/HDL Ratio: 3
Triglycerides: 137 mg/dL (ref 0.0–149.0)
VLDL: 27.4 mg/dL (ref 0.0–40.0)

## 2015-09-05 LAB — CBC WITH DIFFERENTIAL/PLATELET
BASOS PCT: 1 % (ref 0.0–3.0)
Basophils Absolute: 0.1 10*3/uL (ref 0.0–0.1)
EOS ABS: 0.3 10*3/uL (ref 0.0–0.7)
Eosinophils Relative: 4 % (ref 0.0–5.0)
HEMATOCRIT: 40.7 % (ref 36.0–46.0)
Hemoglobin: 13.4 g/dL (ref 12.0–15.0)
Lymphocytes Relative: 34.7 % (ref 12.0–46.0)
Lymphs Abs: 2.7 10*3/uL (ref 0.7–4.0)
MCHC: 33 g/dL (ref 30.0–36.0)
MCV: 81.5 fl (ref 78.0–100.0)
MONO ABS: 0.7 10*3/uL (ref 0.1–1.0)
Monocytes Relative: 8.4 % (ref 3.0–12.0)
NEUTROS ABS: 4 10*3/uL (ref 1.4–7.7)
Neutrophils Relative %: 51.9 % (ref 43.0–77.0)
Platelets: 239 10*3/uL (ref 150.0–400.0)
RBC: 4.99 Mil/uL (ref 3.87–5.11)
RDW: 14.1 % (ref 11.5–15.5)
WBC: 7.8 10*3/uL (ref 4.0–10.5)

## 2015-09-05 LAB — BASIC METABOLIC PANEL
BUN: 15 mg/dL (ref 6–23)
CHLORIDE: 105 meq/L (ref 96–112)
CO2: 28 mEq/L (ref 19–32)
Calcium: 9.9 mg/dL (ref 8.4–10.5)
Creatinine, Ser: 0.79 mg/dL (ref 0.40–1.20)
GFR: 79.03 mL/min (ref >60.00)
Glucose, Bld: 117 mg/dL — ABNORMAL HIGH (ref 70–99)
POTASSIUM: 4.7 meq/L (ref 3.5–5.1)
Sodium: 141 mEq/L (ref 135–145)

## 2015-09-05 LAB — HEPATIC FUNCTION PANEL
ALT: 28 U/L (ref 0–35)
AST: 23 U/L (ref 0–37)
Albumin: 4.6 g/dL (ref 3.5–5.2)
Alkaline Phosphatase: 88 U/L (ref 39–117)
BILIRUBIN DIRECT: 0.1 mg/dL (ref 0.0–0.3)
BILIRUBIN TOTAL: 0.4 mg/dL (ref 0.2–1.2)
Total Protein: 7.5 g/dL (ref 6.0–8.3)

## 2015-09-05 LAB — MICROALBUMIN / CREATININE URINE RATIO
Creatinine,U: 65.6 mg/dL
Microalb Creat Ratio: 1.1 mg/g (ref 0.0–30.0)
Microalb, Ur: <0.7 mg/dL (ref 0.0–1.9)

## 2015-09-05 LAB — HEMOGLOBIN A1C: HEMOGLOBIN A1C: 6.2 % (ref 4.6–6.5)

## 2015-09-12 ENCOUNTER — Encounter: Payer: Self-pay | Admitting: Internal Medicine

## 2015-09-12 ENCOUNTER — Ambulatory Visit (INDEPENDENT_AMBULATORY_CARE_PROVIDER_SITE_OTHER): Payer: 59 | Admitting: Internal Medicine

## 2015-09-12 VITALS — BP 118/70 | HR 62 | Temp 98.0°F | Ht 61.5 in | Wt 172.0 lb

## 2015-09-12 DIAGNOSIS — Z23 Encounter for immunization: Secondary | ICD-10-CM | POA: Diagnosis not present

## 2015-09-12 DIAGNOSIS — E785 Hyperlipidemia, unspecified: Secondary | ICD-10-CM | POA: Diagnosis not present

## 2015-09-12 DIAGNOSIS — I10 Essential (primary) hypertension: Secondary | ICD-10-CM

## 2015-09-12 DIAGNOSIS — E119 Type 2 diabetes mellitus without complications: Secondary | ICD-10-CM

## 2015-09-12 DIAGNOSIS — Z0000 Encounter for general adult medical examination without abnormal findings: Secondary | ICD-10-CM | POA: Diagnosis not present

## 2015-09-12 DIAGNOSIS — E041 Nontoxic single thyroid nodule: Secondary | ICD-10-CM | POA: Diagnosis not present

## 2015-09-12 MED ORDER — ONETOUCH ULTRA 2 W/DEVICE KIT: PACK | Status: DC

## 2015-09-12 MED ORDER — LANCETS MISC: Status: DC

## 2015-09-12 MED ORDER — GLUCOSE BLOOD VI STRP: ORAL_STRIP | Status: DC

## 2015-09-12 NOTE — Assessment & Plan Note (Signed)

## 2015-09-12 NOTE — Progress Notes (Signed)
Subjective:    Patient ID: [name] [name], female    DOB: 11-13-1955, 59 y.o.   MRN: [id number]  HPI   Here for wellness and f/u;  Overall doing ok;  Pt denies Chest pain, worsening SOB, DOE, wheezing, orthopnea, PND, worsening LE edema, palpitations, dizziness or syncope.  Pt denies neurological change such as new headache, facial or extremity weakness.  Pt denies polydipsia, polyuria, or low sugar symptoms. Pt states overall good compliance with treatment and medications, good tolerability, and has been trying to follow appropriate diet.  Pt denies worsening depressive symptoms, suicidal ideation or panic. No fever, night sweats, wt loss, loss of appetite, or other constitutional symptoms.  Pt states good ability with ADL's, has low fall risk, home safety reviewed and adequate, no other significant changes in hearing or vision, and only occasionally active with exercise. Has third DM education class coming up soon. Asks for glucometer and supplies, knows how to check sugars from checking her aunt yrs ago.  Declines podiatry referral, has seen optho earlier this yr Wt Readings from Last 3 Encounters:  09/12/15 172 lb (78.019 kg)  07/22/15 177 lb 12.8 oz (80.65 kg)  07/09/15 178 lb (80.74 kg)   Past Medical History  Diagnosis Date  . ALLERGIC RHINITIS 04/06/2007  . COLONIC POLYPS 08/04/2007  . DIARRHEA, ACUTE 09/23/2010  . GASTRIC POLYP 08/04/2007  . GERD 08/04/2007  . Headache(784.0) 11/13/2008  . HYPERLIPIDEMIA 04/06/2007  . ISCHEMIC COLITIS, HX OF 07/06/2007  . Palpitations 03/10/2009  . Rosacea 11/30/2007  . UTI'S, RECURRENT 11/30/2007  . ANXIETY 04/06/2007  . GOITER, MULTINODULAR 03/10/2009  . THYROID NODULE, LEFT 11/04/2010  . CIN I (cervical intraepithelial neoplasia I)   . Fibroid   . HYPERTENSION 04/06/2007  . Osteopenia 12/2012    T score -1.1 FRAX 5.9%/0.3%  . Hx of blood clots   . Urinary frequency   . Lichen sclerosus 123456  . Arthritis   . Diabetes mellitus without complication  [facility])    Past Surgical History  Procedure Laterality Date  . Dermoid cyst  excision  1994    right ovary  . Uterine fibroids removed  1996  . Right foot neuroma  1988  . Removed skull cyst  1973  . Flexible sigmoidoscopy  11/16/95  . Appendectomy  2005  . Bladder tack    . Cystoscopy  2007  . Lt. pectoral/subclavicular abscess drainage  2006  . Colposcopy      reports that she has never smoked. She does not have any smokeless tobacco history on file. She reports that she drinks alcohol. She reports that she does not use illicit drugs. family history includes Breast cancer in her paternal aunt; Deep vein thrombosis in her brother; Diabetes in her mother; Emphysema in her father; Heart disease in her father and mother; Hypertension in her mother; Lung cancer in her maternal grandmother, maternal uncle, and mother; Stroke in her brother and father. Allergies  Allergen Reactions  . Atorvastatin     REACTION: extreme muscle aches  . Cefuroxime Axetil     REACTION: DIARRHEA  . Ciprofloxacin     REACTION: diarrhea  . Latex     REACTION: rash  . Lipitor [Atorvastatin Calcium] Other (See Comments)    sensitive  . Moxifloxacin     REACTION: DIARRHEA   Current Outpatient Prescriptions on File Prior to Visit  Medication Sig Dispense Refill  . aspirin 81 MG EC tablet Take 81 mg by mouth daily.      Marland Kitchen  cetirizine (ZYRTEC) 10 MG tablet Take 10 mg by mouth daily.      . clobetasol cream (TEMOVATE) 0.05 % Apply nightly as needed for irritation 30 g 2  . dextromethorphan-guaiFENesin (MUCINEX DM) 30-600 MG per 12 hr tablet Take 1 tablet by mouth 2 (two) times daily.    . Esomeprazole Magnesium (NEXIUM 24HR PO) Take by mouth daily.    . fluticasone (FLONASE) 50 MCG/ACT nasal spray Place into both nostrils daily.    . Multiple Vitamins-Minerals (MULTIVITAMIN PO) Take by mouth daily.    . potassium chloride SA (K-DUR,KLOR-CON) 20 MEQ tablet Take 1 tablet by mouth two  times daily 180 tablet 1  .  rosuvastatin (CRESTOR) 20 MG tablet Take 1 tablet by mouth  daily 90 tablet 1  . valsartan-hydrochlorothiazide (DIOVAN-HCT) 320-25 MG per tablet Take 1 tablet by mouth  daily 90 tablet 1   No current facility-administered medications on file prior to visit.   Review of Systems  Constitutional: Negative for increased diaphoresis, other activity, appetite or siginficant weight change other than noted HENT: Negative for worsening hearing loss, ear pain, facial swelling, mouth sores and neck stiffness.   Eyes: Negative for other worsening pain, redness or visual disturbance.  Respiratory: Negative for shortness of breath and wheezing  Cardiovascular: Negative for chest pain and palpitations.  Gastrointestinal: Negative for diarrhea, blood in stool, abdominal distention or other pain Genitourinary: Negative for hematuria, flank pain or change in urine volume.  Musculoskeletal: Negative for myalgias or other joint complaints.  Skin: Negative for color change and wound or drainage.  Neurological: Negative for syncope and numbness. other than noted Hematological: Negative for adenopathy. or other swelling Psychiatric/Behavioral: Negative for hallucinations, SI, self-injury, decreased concentration or other worsening agitation.      Objective:   Physical Exam BP 118/70 mmHg  Pulse 62  Temp(Src) 98 F (36.7 C) (Oral)  Ht 5' 1.5" (1.562 m)  Wt 172 lb (78.019 kg)  BMI 31.98 kg/m2  SpO2 97% VS noted,  Constitutional: Pt is oriented to person, place, and time. Appears well-developed and well-nourished, in no significant distress Head: Normocephalic and atraumatic.  Right Ear: External ear normal.  Left Ear: External ear normal.  Nose: Nose normal.  Right thyroid with palpable firm right nodule at least 3-4 cm Mouth/Throat: Oropharynx is clear and moist.  Eyes: Conjunctivae and EOM are normal. Pupils are equal, round, and reactive to light.  Neck: Normal range of motion. Neck supple. No JVD  present. No tracheal deviation present or significant neck LA or mass Cardiovascular: Normal rate, regular rhythm, normal heart sounds and intact distal pulses.   Pulmonary/Chest: Effort normal and breath sounds without rales or wheezing  Abdominal: Soft. Bowel sounds are normal. NT. No HSM  Musculoskeletal: Normal range of motion. Exhibits no edema.  Lymphadenopathy:  Has no cervical adenopathy.  Neurological: Pt is alert and oriented to person, place, and time. Pt has normal reflexes. No cranial nerve deficit. Motor grossly intact Skin: Skin is warm and dry. No rash noted.  Psychiatric:  Has normal mood and affect. Behavior is normal.     Assessment & Plan:

## 2015-09-12 NOTE — Patient Instructions (Addendum)
You had the Pneumovax shot today  Please continue all other medications as before, and refills have been done if requested.  Please have the pharmacy call with any other refills you may need.  Please continue your efforts at being more active, low cholesterol diet, and weight control.  You are otherwise up to date with prevention measures today.  Please keep your appointments with your specialists as you may have planned  [facility]  You will be contacted regarding the referral for: thyroid ultraosund  Please return in 6 months, or sooner if needed, with Lab testing done 3-5 days before

## 2015-09-12 NOTE — Assessment & Plan Note (Signed)
stable overall by history and exam, recent data reviewed with pt, and pt to continue medical treatment as before,  to f/u any worsening symptoms or concerns Lab Results  Component Value Date   LDLCALC 71 09/05/2015

## 2015-09-12 NOTE — Assessment & Plan Note (Signed)
stable overall by history and exam, recent data reviewed with pt, and pt to continue medical treatment as before,  to f/u any worsening symptoms or concerns Lab Results  Component Value Date   HGBA1C 6.2 09/05/2015   To finish DM education, glucometer and supplies, yearly optho eval, declines podiatry for now

## 2015-09-12 NOTE — Assessment & Plan Note (Signed)
stable overall by history and exam, recent data reviewed with pt, and pt to continue medical treatment as before,  to f/u any worsening symptoms or concerns BP Readings from Last 3 Encounters:  09/12/15 118/70  07/09/15 128/78  12/06/14 130/70

## 2015-09-12 NOTE — Assessment & Plan Note (Signed)
Has right and left nodules, last u/s 2013, s/p biopsy neg x 2, for repeat u/s with some ? Per pt of enlarging

## 2015-09-16 ENCOUNTER — Ambulatory Visit
Admission: RE | Admit: 2015-09-16 | Discharge: 2015-09-16 | Disposition: A | Discharge status: Home / Self Care | Payer: 59 | Source: Ambulatory Visit | Attending: Internal Medicine | Admitting: Internal Medicine

## 2015-09-16 DIAGNOSIS — E041 Nontoxic single thyroid nodule: Secondary | ICD-10-CM

## 2015-09-17 ENCOUNTER — Other Ambulatory Visit: Payer: Self-pay | Admitting: Internal Medicine

## 2015-09-17 DIAGNOSIS — E041 Nontoxic single thyroid nodule: Secondary | ICD-10-CM

## 2015-09-25 ENCOUNTER — Encounter: Payer: Self-pay | Admitting: Gynecology

## 2015-10-01 ENCOUNTER — Other Ambulatory Visit (HOSPITAL_COMMUNITY)
Admission: RE | Admit: 2015-10-01 | Discharge: 2015-10-01 | Disposition: A | Discharge status: Home / Self Care | Payer: 59 | Source: Ambulatory Visit | Attending: Radiology | Admitting: Radiology

## 2015-10-01 ENCOUNTER — Ambulatory Visit
Admission: RE | Admit: 2015-10-01 | Discharge: 2015-10-01 | Disposition: A | Discharge status: Home / Self Care | Payer: 59 | Source: Ambulatory Visit | Attending: Internal Medicine | Admitting: Internal Medicine

## 2015-10-01 DIAGNOSIS — E041 Nontoxic single thyroid nodule: Secondary | ICD-10-CM | POA: Insufficient documentation

## 2015-10-30 ENCOUNTER — Encounter: Payer: Self-pay | Admitting: Gynecology

## 2015-11-06 ENCOUNTER — Encounter: Payer: Self-pay | Admitting: Internal Medicine

## 2015-11-06 ENCOUNTER — Ambulatory Visit (INDEPENDENT_AMBULATORY_CARE_PROVIDER_SITE_OTHER): Payer: 59 | Admitting: Internal Medicine

## 2015-11-06 VITALS — BP 122/80 | HR 78 | Temp 97.9°F | Resp 20 | Wt 179.0 lb

## 2015-11-06 DIAGNOSIS — I10 Essential (primary) hypertension: Secondary | ICD-10-CM

## 2015-11-06 DIAGNOSIS — J019 Acute sinusitis, unspecified: Secondary | ICD-10-CM

## 2015-11-06 DIAGNOSIS — J309 Allergic rhinitis, unspecified: Secondary | ICD-10-CM

## 2015-11-06 DIAGNOSIS — E119 Type 2 diabetes mellitus without complications: Secondary | ICD-10-CM

## 2015-11-06 MED ORDER — AMOXICILLIN-POT CLAVULANATE 875-125 MG PO TABS: 1.0000 | ORAL_TABLET | Freq: Two times a day (BID) | ORAL | Status: DC

## 2015-11-06 NOTE — Patient Instructions (Signed)
Please take all new medication as prescribed - the antibiotic  Please continue all other medications as before, and refills have been done if requested.  Please have the pharmacy call with any other refills you may need.  Please keep your appointments with your specialists as you may have planned   

## 2015-11-06 NOTE — Progress Notes (Signed)
Subjective:    Patient ID: [name] [name], female    DOB: 01/11/1956, 60 y.o.   MRN: [id number]  HPI   Here with 2-3 days acute onset fever, facial pain, pressure, headache, general weakness and malaise, and greenish d/c, with mild ST and cough, but pt denies chest pain, wheezing, increased sob or doe, orthopnea, PND, increased LE swelling, palpitations, dizziness or syncope.  Does have several wks ongoing nasal allergy symptoms with clearish congestion, itch and sneezing, without fever, pain, ST, cough, swelling or wheezing.   Pt denies polydipsia, polyuria.  Past Medical History  Diagnosis Date  . ALLERGIC RHINITIS 04/06/2007  . COLONIC POLYPS 08/04/2007  . DIARRHEA, ACUTE 09/23/2010  . GASTRIC POLYP 08/04/2007  . GERD 08/04/2007  . Headache(784.0) 11/13/2008  . HYPERLIPIDEMIA 04/06/2007  . ISCHEMIC COLITIS, HX OF 07/06/2007  . Palpitations 03/10/2009  . Rosacea 11/30/2007  . UTI'S, RECURRENT 11/30/2007  . ANXIETY 04/06/2007  . GOITER, MULTINODULAR 03/10/2009  . THYROID NODULE, LEFT 11/04/2010  . CIN I (cervical intraepithelial neoplasia I)   . Fibroid   . HYPERTENSION 04/06/2007  . Osteopenia 12/2012    T score -1.1 FRAX 5.9%/0.3%  . Hx of blood clots   . Urinary frequency   . Lichen sclerosus 9622  . Arthritis   . Diabetes mellitus without complication [facility])    Past Surgical History  Procedure Laterality Date  . Dermoid cyst  excision  1994    right ovary  . Uterine fibroids removed  1996  . Right foot neuroma  1988  . Removed skull cyst  1973  . Flexible sigmoidoscopy  11/16/95  . Appendectomy  2005  . Bladder tack    . Cystoscopy  2007  . Lt. pectoral/subclavicular abscess drainage  2006  . Colposcopy      reports that she has never smoked. She does not have any smokeless tobacco history on file. She reports that she drinks alcohol. She reports that she does not use illicit drugs. family history includes Breast cancer in her paternal aunt; Deep vein thrombosis in her  brother; Diabetes in her mother; Emphysema in her father; Heart disease in her father and mother; Hypertension in her mother; Lung cancer in her maternal grandmother, maternal uncle, and mother; Stroke in her brother and father. Allergies  Allergen Reactions  . Atorvastatin     REACTION: extreme muscle aches  . Cefuroxime Axetil     REACTION: DIARRHEA  . Ciprofloxacin     REACTION: diarrhea  . Latex     REACTION: rash  . Lipitor [Atorvastatin Calcium] Other (See Comments)    sensitive  . Moxifloxacin     REACTION: DIARRHEA   Current Outpatient Prescriptions on File Prior to Visit  Medication Sig Dispense Refill  . aspirin 81 MG EC tablet Take 81 mg by mouth daily.      . Blood Glucose Monitoring Suppl (ONE TOUCH ULTRA 2) W/DEVICE KIT Use as directed 1 per day 1 each 0  . cetirizine (ZYRTEC) 10 MG tablet Take 10 mg by mouth daily.      . clobetasol cream (TEMOVATE) 0.05 % Apply nightly as needed for irritation 30 g 2  . dextromethorphan-guaiFENesin (MUCINEX DM) 30-600 MG per 12 hr tablet Take 1 tablet by mouth 2 (two) times daily.    . Esomeprazole Magnesium (NEXIUM 24HR PO) Take by mouth daily.    . fluticasone (FLONASE) 50 MCG/ACT nasal spray Place into both nostrils daily.    Marland Kitchen glucose blood test strip Use as  instructed 100 each 12  . Lancets MISC Use as directed 1 per day 100 each 12  . Multiple Vitamins-Minerals (MULTIVITAMIN PO) Take by mouth daily.    . potassium chloride SA (K-DUR,KLOR-CON) 20 MEQ tablet Take 1 tablet by mouth two  times daily 180 tablet 1  . rosuvastatin (CRESTOR) 20 MG tablet Take 1 tablet by mouth  daily 90 tablet 1  . valsartan-hydrochlorothiazide (DIOVAN-HCT) 320-25 MG per tablet Take 1 tablet by mouth  daily 90 tablet 1   No current facility-administered medications on file prior to visit.   Review of Systems  Constitutional: Negative for unusual diaphoresis or night sweats HENT: Negative for ringing in ear or discharge Eyes: Negative for double  vision or worsening visual disturbance.  Respiratory: Negative for choking and stridor.   Gastrointestinal: Negative for vomiting or other signifcant bowel change Genitourinary: Negative for hematuria or change in urine volume.  Musculoskeletal: Negative for other MSK pain or swelling Skin: Negative for color change and worsening wound.  Neurological: Negative for tremors and numbness other than noted  Psychiatric/Behavioral: Negative for decreased concentration or agitation other than above       Objective:   Physical Exam BP 122/80 mmHg  Pulse 78  Temp(Src) 97.9 F (36.6 C) (Oral)  Resp 20  Wt 179 lb (81.194 kg)  SpO2 94% VS noted, mild ill Constitutional: Pt appears in no significant distress HENT: Head: NCAT.  Right Ear: External ear normal.  Left Ear: External ear normal.  Eyes: . Pupils are equal, round, and reactive to light. Conjunctivae and EOM are normal Bilat tm's with mild erythema.  Max sinus areas mild tender.  Pharynx with mild erythema, no exudate Neck: Normal range of motion. Neck supple.  Cardiovascular: Normal rate and regular rhythm.   Pulmonary/Chest: Effort normal and breath sounds without rales or wheezing.  Neurological: Pt is alert. Not confused , motor grossly intact Skin: Skin is warm. No rash, no LE edema Psychiatric: Pt behavior is normal. No agitation.     Assessment & Plan:

## 2015-11-06 NOTE — Progress Notes (Signed)
Pre visit review using our clinic review tool, if applicable. No additional management support is needed unless otherwise documented below in the visit note. 

## 2015-11-08 NOTE — Assessment & Plan Note (Signed)
stable overall by history and exam, recent data reviewed with pt, and pt to continue medical treatment as before,  to f/u any worsening symptoms or concerns Lab Results  Component Value Date   HGBA1C 6.2 09/05/2015

## 2015-11-08 NOTE — Assessment & Plan Note (Signed)
Mild to mod, for restart antihist prn ,  to f/u any worsening symptoms or concerns

## 2015-11-08 NOTE — Assessment & Plan Note (Signed)
Mild to mod, for antibx course,  to f/u any worsening symptoms or concerns 

## 2015-11-08 NOTE — Assessment & Plan Note (Signed)
stable overall by history and exam, recent data reviewed with pt, and pt to continue medical treatment as before,  to f/u any worsening symptoms or concerns BP Readings from Last 3 Encounters:  11/06/15 122/80  09/12/15 118/70  07/09/15 128/78

## 2015-11-10 ENCOUNTER — Telehealth: Payer: Self-pay | Admitting: Internal Medicine

## 2015-11-10 DIAGNOSIS — J329 Chronic sinusitis, unspecified: Secondary | ICD-10-CM

## 2015-11-10 MED ORDER — DOXYCYCLINE HYCLATE 100 MG PO TABS: 100.0000 mg | ORAL_TABLET | Freq: Two times a day (BID) | ORAL | Status: DC

## 2015-11-10 NOTE — Telephone Encounter (Signed)
Please advise, thanks.

## 2015-11-10 NOTE — Telephone Encounter (Signed)
Pt was in to see Dr. Jenny Reichmann and he prescribed amoxicillin-clavulanate (AUGMENTIN) 875-125 MG tablet [id number] . This is giving her severe diarrhea and she is [facility] [address]. She is aware he is out today, but is hoping another provider may be able to prescribe. She can be reached at [phone]

## 2015-11-10 NOTE — Telephone Encounter (Signed)
Advised patient of [name] [name] note, patient to pick up new rx and try---will stop augmentin---patient repeated back for understanding

## 2015-11-10 NOTE — Telephone Encounter (Signed)
Please have her stop the Augmentin. I have sent in a new prescription for doxycycline.

## 2015-11-28 ENCOUNTER — Other Ambulatory Visit: Payer: Self-pay

## 2015-11-28 ENCOUNTER — Other Ambulatory Visit: Payer: Self-pay | Admitting: Internal Medicine

## 2015-11-28 MED ORDER — ROSUVASTATIN CALCIUM 20 MG PO TABS: ORAL_TABLET | ORAL | Status: DC

## 2015-11-28 MED ORDER — POTASSIUM CHLORIDE CRYS ER 20 MEQ PO TBCR: EXTENDED_RELEASE_TABLET | ORAL | Status: DC

## 2015-12-10 ENCOUNTER — Encounter: Payer: 59 | Attending: Internal Medicine | Admitting: Skilled Nursing Facility1

## 2015-12-10 VITALS — Ht 61.0 in | Wt 177.9 lb

## 2015-12-10 DIAGNOSIS — E119 Type 2 diabetes mellitus without complications: Secondary | ICD-10-CM | POA: Diagnosis present

## 2015-12-12 ENCOUNTER — Encounter: Payer: Self-pay | Admitting: Skilled Nursing Facility1

## 2015-12-12 NOTE — Progress Notes (Signed)
Diabetes Self-Management Education  Visit Type:  4 Month Follow-Up  Appt. Start Time: 5:30 Appt. End Time: 6:30  12/12/2015  [name], identified by name and date of birth, is a 60 y.o. female with a diagnosis of Diabetes:  .   ASSESSMENT  Height 5\' 1"  (1.549 m), weight 177 lb 14.4 oz (80.695 kg). Body mass index is 33.63 kg/(m^2).  Pt states she has: been eating 2-3 carb choices per meal, 2 snacks a day.       Diabetes Self-Management Education - 12/12/15 1114    Health Coping   How would you rate your overall health? Excellent   Psychosocial Assessment   Patient Belief/Attitude about Diabetes Motivated to manage diabetes   Self-care barriers None   Self-management support [place]   Complications   Last HgB A1C per patient/outside source 6.2 %   How often do you check your blood sugar? 1-2 times/day   Have you had a dilated eye exam in the past 12 months? Yes   Have you had a dental exam in the past 12 months? Yes   Are you checking your feet? Yes   How many days per week are you checking your feet? 7   Exercise   Exercise Type Light (walking / raking leaves)   How many days per week to you exercise? 4   Patient Education   Previous Diabetes Education Yes (please comment)   Outcomes   Program Status Completed   Subsequent Visit   Since your last visit have you continued or begun to take your medications as prescribed? Yes   Since your last visit have you experienced any weight changes? Loss   Since your last visit, are you checking your blood glucose at least once a day? Yes      Learning Objective:  Patient will have a greater understanding of diabetes self-management. Patient education plan is to attend individual and/or group sessions per assessed needs and concerns.   Plan:   There are no Patient Instructions on file for this visit.   Expected Outcomes:  Demonstrated interest in learning. Expect positive outcomes  Education material provided:  Living Well with Diabetes  If problems or questions, patient to contact team via:  Phone  Future DSME appointment: - PRN

## 2015-12-17 ENCOUNTER — Encounter: Payer: Self-pay | Admitting: Gynecology

## 2016-01-29 ENCOUNTER — Ambulatory Visit (INDEPENDENT_AMBULATORY_CARE_PROVIDER_SITE_OTHER): Payer: 59 | Admitting: Gynecology

## 2016-01-29 ENCOUNTER — Encounter: Payer: Self-pay | Admitting: Gynecology

## 2016-01-29 VITALS — BP 114/64 | Ht 62.0 in | Wt 178.0 lb

## 2016-01-29 DIAGNOSIS — Z01419 Encounter for gynecological examination (general) (routine) without abnormal findings: Secondary | ICD-10-CM | POA: Diagnosis not present

## 2016-01-29 DIAGNOSIS — M8580 Other specified disorders of bone density and structure, unspecified site: Secondary | ICD-10-CM | POA: Diagnosis not present

## 2016-01-29 DIAGNOSIS — N952 Postmenopausal atrophic vaginitis: Secondary | ICD-10-CM | POA: Diagnosis not present

## 2016-01-29 DIAGNOSIS — L292 Pruritus vulvae: Secondary | ICD-10-CM

## 2016-01-29 MED ORDER — CLOBETASOL PROPIONATE 0.05 % EX CREA: TOPICAL_CREAM | CUTANEOUS | Status: DC

## 2016-01-29 NOTE — Progress Notes (Signed)
    DYMONIQUE GILLEN [birth year] [id number]        60 y.o.  G0P0  for annual exam.  Several issues noted below.  Past medical history,surgical history, problem list, medications, allergies, family history and social history were all reviewed and documented as reviewed in the EPIC chart.  ROS:  Performed with pertinent positives and negatives included in the history, assessment and plan.   Additional significant findings :  none   Exam: Caryn Bee assistant Filed Vitals:   01/29/16 1409  BP: 114/64  Height: 5\' 2"  (1.575 m)  Weight: 178 lb (80.74 kg)   General appearance:  Normal affect, orientation and appearance. Skin: Grossly normal HEENT: Without gross lesions.  No cervical or supraclavicular adenopathy. Thyroid normal.  Lungs:  Clear without wheezing, rales or rhonchi Cardiac: RR, without RMG Abdominal:  Soft, nontender, without masses, guarding, rebound, organomegaly or hernia Breasts:  Examined lying and sitting without masses, retractions, discharge or axillary adenopathy. Pelvic:  Ext/BUS/vagina with slight whitish change posterior fourchette/perineal body. No specific lesions  Cervix  atrophic  Uterus anteverted, normal size, shape and contour, midline and mobile nontender   Adnexa without masses or tenderness    Anus and perineum normal   Rectovaginal normal sphincter tone without palpated masses or tenderness.    Assessment/Plan:  60 y.o. G0P0 female for annual exam.   1. Pruritic area perineal body. Biopsy 2014 showed a hyperkeratotic epithelium but no atypia. Her itching continues. She is using Temovate 0.05% cream with some relief. It sounds like she is not using it enough and that she'll use it for a week or 2 and then stop. Recommended using it for several weeks and then weaning off assuming symptoms clear. Chronicity of the condition reviewed the need to retreat at intervals discussed. I do recommend that we resample this area just to make sure nothing else is  changing and she is going to make an appointment for a colposcopy/biopsy appointment.  Refill of Temovate provided 2. Osteopenia. DEXA 2014 T score -1.1 FRAX 6%/0.3%. Will plan DEXA next year at age 34. Increase calcium and vitamin D. 3. Pap smear/HPV 08/2014 negative. History of CIN-1 1994. Normal Paps since. Plan repeat Pap approaching 5 year interval for current screening guidelines. 4. Mammography 08/2015. Continue with annual mammography when due. SBE monthly reviewed. 5. Colonoscopy 2008 with planned repeat next year at 10 year interval. 6. Health maintenance. No routine lab work done as this is done at her primary physician's office. Follow up 1 year, sooner as needed.   Anastasio Auerbach MD, 2:41 PM 01/29/2016

## 2016-01-29 NOTE — Patient Instructions (Signed)
Follow up for vulvar biopsy as scheduled 

## 2016-02-07 ENCOUNTER — Other Ambulatory Visit: Payer: Self-pay | Admitting: Internal Medicine

## 2016-02-25 ENCOUNTER — Ambulatory Visit: Payer: 59 | Admitting: Gynecology

## 2016-02-27 ENCOUNTER — Other Ambulatory Visit: Payer: Self-pay | Admitting: *Deleted

## 2016-02-27 MED ORDER — FLUTICASONE PROPIONATE 50 MCG/ACT NA SUSP: 1.0000 | Freq: Every day | NASAL | Status: DC

## 2016-03-02 ENCOUNTER — Ambulatory Visit (INDEPENDENT_AMBULATORY_CARE_PROVIDER_SITE_OTHER): Payer: 59 | Admitting: Gynecology

## 2016-03-02 ENCOUNTER — Encounter: Payer: Self-pay | Admitting: Gynecology

## 2016-03-02 VITALS — BP 116/66

## 2016-03-02 DIAGNOSIS — L900 Lichen sclerosus et atrophicus: Secondary | ICD-10-CM

## 2016-03-02 NOTE — Patient Instructions (Signed)
Office will call you with biopsy resultsVulva Biopsy, Care After Refer to this sheet in the next few weeks. These instructions provide you with information on caring for yourself after your procedure. Your caregiver may also give you more specific instructions. Your treatment has been planned according to current medical practices, but problems sometimes occur. Call your caregiver if you have any problems or questions after your procedure.  HOME CARE INSTRUCTIONS  Only take over-the-counter or prescription medicines for pain or discomfort as directed by your caregiver.  Do not rub the biopsy area after urinating. Gently pat the area dry or use a bottle filled with warm water (peri-bottle) to clean the area. Gently wipe from front to back.  Clean the area using water and mild soap. Gently pat the area dry.  Check your biopsy site with a handheld mirror daily to be sure it is healing properly.  Do not have intercourse for 3-4 days or as directed by your caregiver.  Avoid exercise, such as running or biking, for 2-3 days or as directed by your caregiver.  You may shower after the procedure. Avoid bathing and swimming until the sutures dissolve and healing is complete.  Wear loose cotton underwear. Do not wear tight pants.  Keep all follow-up appointments with your caregiver.  Contact your caregiver to obtain your test results. SEEK IMMEDIATE MEDICAL CARE IF:  Your pain increases and medicine does not help it.  Your vaginal area becomes swollen or red.  You have fluid or an abnormally bad smell coming from your vagina.  You have a fever. MAKE SURE YOU:  Understand these instructions.  Will watch your condition.  Will get help right away if you are not doing well or get worse.   This information is not intended to replace advice given to you by your health care provider. Make sure you discuss any questions you have with your health care provider.   Document Released: 08/23/2012  Document Revised: 09/27/2014 Document Reviewed: 08/23/2012 Elsevier Interactive Patient Education [employer].

## 2016-03-02 NOTE — Progress Notes (Signed)
    [name] [name] December 23, 1955 [id number]        60 y.o.  G0P0 presents for perineal biopsy. History of chronic vulvar itching and irritation consistent with lichen sclerosis. Being treated with Temovate is 0.05 % cream.  Had biopsy to a half years ago and I asked her to follow up now for resampling just to make sure there are no other issues.  Past medical history,surgical history, problem list, medications, allergies, family history and social history were all reviewed and documented in the EPIC chart.  Directed ROS with pertinent positives and negatives documented in the history of present illness/assessment and plan.  Exam: Caryn Bee assistant Filed Vitals:   03/02/16 1147  BP: 116/66   General appearance:  Normal Pelvic external BUS vagina with atrophic changes. Blanched white area posterior fourchette/perineal body as diagrammed.  Procedure: The skin in the midline was cleansed with Betadine, infiltrated with 1% Xylocaine and a representative biopsy was taken. Silver nitrate applied for hemostasis. Postoperative instructions given. Patient tolerated well.   Physical Exam  Genitourinary:        Assessment/Plan:  60 y.o. G0P0 with chronic vulvar irritation having good response to the Temovate when she uses it.  Biopsy performed today. Patient will follow up for results and continue with the steroid cream for now.     Anastasio Auerbach MD, 11:57 AM 03/02/2016

## 2016-03-02 NOTE — Addendum Note (Signed)
Addended by: Anastasio Auerbach on: 03/02/2016 12:29 PM   Modules accepted: Orders

## 2016-03-12 ENCOUNTER — Ambulatory Visit: Payer: 59 | Admitting: Internal Medicine

## 2016-03-29 ENCOUNTER — Other Ambulatory Visit (INDEPENDENT_AMBULATORY_CARE_PROVIDER_SITE_OTHER): Payer: 59

## 2016-03-29 DIAGNOSIS — E119 Type 2 diabetes mellitus without complications: Secondary | ICD-10-CM | POA: Diagnosis not present

## 2016-03-29 LAB — BASIC METABOLIC PANEL
BUN: 18 mg/dL (ref 6–23)
CALCIUM: 9.5 mg/dL (ref 8.4–10.5)
CO2: 24 meq/L (ref 19–32)
CREATININE: 0.75 mg/dL (ref 0.40–1.20)
Chloride: 105 mEq/L (ref 96–112)
GFR: 83.75 mL/min (ref >60.00)
GLUCOSE: 125 mg/dL — AB (ref 70–99)
Potassium: 3.8 mEq/L (ref 3.5–5.1)
SODIUM: 139 meq/L (ref 135–145)

## 2016-03-29 LAB — LIPID PANEL
CHOLESTEROL: 148 mg/dL (ref 0–200)
HDL: 44.2 mg/dL (ref >39.00)
LDL CALC: 73 mg/dL (ref 0–99)
NONHDL: 103.53
Total CHOL/HDL Ratio: 3
Triglycerides: 155 mg/dL — ABNORMAL HIGH (ref 0.0–149.0)
VLDL: 31 mg/dL (ref 0.0–40.0)

## 2016-03-29 LAB — HEPATIC FUNCTION PANEL
ALBUMIN: 4.4 g/dL (ref 3.5–5.2)
ALK PHOS: 95 U/L (ref 39–117)
ALT: 29 U/L (ref 0–35)
AST: 23 U/L (ref 0–37)
Bilirubin, Direct: 0 mg/dL (ref 0.0–0.3)
TOTAL PROTEIN: 7.1 g/dL (ref 6.0–8.3)
Total Bilirubin: 0.4 mg/dL (ref 0.2–1.2)

## 2016-03-29 LAB — HEMOGLOBIN A1C: HEMOGLOBIN A1C: 6.5 % (ref 4.6–6.5)

## 2016-03-31 ENCOUNTER — Ambulatory Visit (INDEPENDENT_AMBULATORY_CARE_PROVIDER_SITE_OTHER): Payer: 59 | Admitting: Internal Medicine

## 2016-03-31 ENCOUNTER — Encounter: Payer: Self-pay | Admitting: Internal Medicine

## 2016-03-31 VITALS — BP 118/72 | HR 71 | Temp 98.0°F | Resp 20 | Wt 181.0 lb

## 2016-03-31 DIAGNOSIS — J309 Allergic rhinitis, unspecified: Secondary | ICD-10-CM

## 2016-03-31 DIAGNOSIS — E119 Type 2 diabetes mellitus without complications: Secondary | ICD-10-CM | POA: Diagnosis not present

## 2016-03-31 DIAGNOSIS — I10 Essential (primary) hypertension: Secondary | ICD-10-CM

## 2016-03-31 DIAGNOSIS — R6889 Other general symptoms and signs: Secondary | ICD-10-CM

## 2016-03-31 DIAGNOSIS — Z0001 Encounter for general adult medical examination with abnormal findings: Secondary | ICD-10-CM

## 2016-03-31 DIAGNOSIS — E785 Hyperlipidemia, unspecified: Secondary | ICD-10-CM | POA: Diagnosis not present

## 2016-03-31 MED ORDER — PREDNISONE 10 MG PO TABS: ORAL_TABLET | ORAL | Status: DC

## 2016-03-31 MED ORDER — METHYLPREDNISOLONE ACETATE 40 MG/ML IJ SUSP
40.0000 mg | Freq: Once | INTRAMUSCULAR | Status: AC
  Administered 2016-03-31: 40 mg via INTRAMUSCULAR

## 2016-03-31 MED ORDER — MONTELUKAST SODIUM 10 MG PO TABS: 10.0000 mg | ORAL_TABLET | Freq: Every day | ORAL | Status: DC

## 2016-03-31 NOTE — Progress Notes (Signed)
Subjective:    Patient ID: [name] [name], female    DOB: 1956-07-18, 60 y.o.   MRN: [id number]  HPI Here to f/u; overall doing ok,  Pt denies chest pain, increasing sob or doe, wheezing, orthopnea, PND, increased LE swelling, palpitations, dizziness or syncope.  Pt denies new neurological symptoms such as new headache, or facial or extremity weakness or numbness.  Pt denies polydipsia, polyuria, or low sugar episode.   Pt denies new neurological symptoms such as new headache, or facial or extremity weakness or numbness.   Pt states overall good compliance with meds, mostly trying to follow appropriate diet, with wt overall stable,  but little exercise however.  Wt Readings from Last 3 Encounters:  03/31/16 181 lb (82.101 kg)  01/29/16 178 lb (80.74 kg)  12/12/15 177 lb 14.4 oz (80.695 kg)  Does have several wks ongoing nasal allergy symptoms with mod to severe clearish congestion, itch and sneezing, without fever, pain, ST, cough, swelling or wheezing. BP Readings from Last 3 Encounters:  03/31/16 118/72  03/02/16 116/66  01/29/16 114/64   Past Medical History  Diagnosis Date  . ALLERGIC RHINITIS 04/06/2007  . COLONIC POLYPS 08/04/2007  . DIARRHEA, ACUTE 09/23/2010  . GASTRIC POLYP 08/04/2007  . GERD 08/04/2007  . Headache(784.0) 11/13/2008  . HYPERLIPIDEMIA 04/06/2007  . ISCHEMIC COLITIS, HX OF 07/06/2007  . Palpitations 03/10/2009  . Rosacea 11/30/2007  . UTI'S, RECURRENT 11/30/2007  . ANXIETY 04/06/2007  . GOITER, MULTINODULAR 03/10/2009  . THYROID NODULE, LEFT 11/04/2010  . CIN I (cervical intraepithelial neoplasia I)   . Fibroid   . HYPERTENSION 04/06/2007  . Osteopenia 12/2012    T score -1.1 FRAX 5.9%/0.3%  . Hx of blood clots   . Urinary frequency   . Lichen sclerosus 7096  . Arthritis   . Diabetes mellitus without complication [facility])    Past Surgical History  Procedure Laterality Date  . Dermoid cyst  excision  1994    right ovary  . Uterine fibroids removed  1996  .  Right foot neuroma  1988  . Removed skull cyst  1973  . Flexible sigmoidoscopy  11/16/95  . Appendectomy  2005  . Bladder tack    . Cystoscopy  2007  . Lt. pectoral/subclavicular abscess drainage  2006  . Colposcopy      reports that she has never smoked. She does not have any smokeless tobacco history on file. She reports that she drinks alcohol. She reports that she does not use illicit drugs. family history includes Breast cancer in her paternal aunt; Deep vein thrombosis in her brother; Diabetes in her mother; Emphysema in her father; Heart disease in her father and mother; Hypertension in her mother; Lung cancer in her maternal grandmother, maternal uncle, and mother; Stroke in her brother and father. Allergies  Allergen Reactions  . Atorvastatin     REACTION: extreme muscle aches  . Cefuroxime Axetil     REACTION: DIARRHEA  . Ciprofloxacin     REACTION: diarrhea  . Latex     REACTION: rash  . Lipitor [Atorvastatin Calcium] Other (See Comments)    sensitive  . Moxifloxacin     REACTION: DIARRHEA   Current Outpatient Prescriptions on File Prior to Visit  Medication Sig Dispense Refill  . aspirin 81 MG EC tablet Take 81 mg by mouth daily.      . Blood Glucose Monitoring Suppl (ONE TOUCH ULTRA 2) W/DEVICE KIT Use as directed 1 per day 1 each 0  .  cetirizine (ZYRTEC) 10 MG tablet Take 10 mg by mouth daily.      . clobetasol cream (TEMOVATE) 0.05 % Apply nightly as needed for irritation 30 g 2  . dextromethorphan-guaiFENesin (MUCINEX DM) 30-600 MG per 12 hr tablet Take 1 tablet by mouth 2 (two) times daily.    . Esomeprazole Magnesium (NEXIUM 24HR PO) Take by mouth daily.    . fluticasone (FLONASE) 50 MCG/ACT nasal spray Place 1 spray into both nostrils daily. 48 g 1  . glucose blood test strip Use as instructed 100 each 12  . Lancets MISC Use as directed 1 per day 100 each 12  . Multiple Vitamins-Minerals (MULTIVITAMIN PO) Take by mouth daily.    . potassium chloride SA  (K-DUR,KLOR-CON) 20 MEQ tablet Take 1 tablet by mouth two  times daily 180 tablet 1  . rosuvastatin (CRESTOR) 20 MG tablet Take 1 tablet by mouth  daily 90 tablet 1  . valsartan-hydrochlorothiazide (DIOVAN-HCT) 320-25 MG tablet Take 1 tablet by mouth  daily 90 tablet 1   No current facility-administered medications on file prior to visit.     Review of Systems  Constitutional: Negative for unusual diaphoresis or night sweats HENT: Negative for ear swelling or discharge Eyes: Negative for worsening visual haziness  Respiratory: Negative for choking and stridor.   Gastrointestinal: Negative for distension or worsening eructation Genitourinary: Negative for retention or change in urine volume.  Musculoskeletal: Negative for other MSK pain or swelling Skin: Negative for color change and worsening wound Neurological: Negative for tremors and numbness other than noted  Psychiatric/Behavioral: Negative for decreased concentration or agitation other than above       Objective:   Physical Exam BP 118/72 mmHg  Pulse 71  Temp(Src) 98 F (36.7 C) (Oral)  Resp 20  Wt 181 lb (82.101 kg)  SpO2 94% VS noted, not ill appearing Constitutional: Pt appears in no apparent distress HENT: Head: NCAT.  Right Ear: External ear normal.  Left Ear: External ear normal. Bilat tm's with mild erythema.  Max sinus areas non tender.  Pharynx with mild erythema, no exudate  Eyes: . Pupils are equal, round, and reactive to light. Conjunctivae and EOM are normal Neck: Normal range of motion. Neck supple.  Cardiovascular: Normal rate and regular rhythm.   Pulmonary/Chest: Effort normal and breath sounds without rales or wheezing.  Neurological: Pt is alert. Not confused , motor grossly intact Skin: Skin is warm. No rash, no LE edema Psychiatric: Pt behavior is normal. No agitation.     Assessment & Plan:

## 2016-03-31 NOTE — Progress Notes (Signed)
Pre visit review using our clinic review tool, if applicable. No additional management support is needed unless otherwise documented below in the visit note. 

## 2016-03-31 NOTE — Patient Instructions (Signed)
You had the steroid shot today  Please take all new medication as prescribed - the prednisone, and the singulair 10 mg per day  Please continue all other medications as before, including the otc antihistamine and nasal steroid  Please call in 2-3 weeks if you would like referral to Allergy if not improved  Please have the pharmacy call with any other refills you may need.  Please keep your appointments with your specialists as you may have planned  Please return in 6 months, or sooner if needed, with Lab testing done 3-5 days before

## 2016-04-07 NOTE — Assessment & Plan Note (Signed)
stable overall by history and exam, recent data reviewed with pt, and pt to continue medical treatment as before,  to f/u any worsening symptoms or concerns BP Readings from Last 3 Encounters:  03/31/16 118/72  03/02/16 116/66  01/29/16 114/64

## 2016-04-07 NOTE — Assessment & Plan Note (Signed)
stable overall by history and exam, recent data reviewed with pt, and pt to continue medical treatment as before,  to f/u any worsening symptoms or concerns Lab Results  Component Value Date   HGBA1C 6.5 03/29/2016

## 2016-04-07 NOTE — Assessment & Plan Note (Signed)
stable overall by history and exam, recent data reviewed with pt, and pt to continue medical treatment as before,  to f/u any worsening symptoms or concerns Lab Results  Component Value Date   LDLCALC 73 03/29/2016

## 2016-04-07 NOTE — Assessment & Plan Note (Signed)
Mod flare, for depomedrol and predpac course,  Cont all other tx, consider allergy referral if not improved, to f/u any worsening symptoms or concerns

## 2016-06-03 ENCOUNTER — Other Ambulatory Visit: Payer: Self-pay | Admitting: Internal Medicine

## 2016-06-08 ENCOUNTER — Ambulatory Visit (INDEPENDENT_AMBULATORY_CARE_PROVIDER_SITE_OTHER): Payer: 59 | Admitting: Internal Medicine

## 2016-06-08 ENCOUNTER — Encounter: Payer: Self-pay | Admitting: Internal Medicine

## 2016-06-08 VITALS — BP 118/68 | HR 93 | Temp 98.2°F | Resp 20 | Wt 180.0 lb

## 2016-06-08 DIAGNOSIS — E785 Hyperlipidemia, unspecified: Secondary | ICD-10-CM | POA: Diagnosis not present

## 2016-06-08 DIAGNOSIS — R6889 Other general symptoms and signs: Secondary | ICD-10-CM

## 2016-06-08 DIAGNOSIS — E119 Type 2 diabetes mellitus without complications: Secondary | ICD-10-CM

## 2016-06-08 DIAGNOSIS — I10 Essential (primary) hypertension: Secondary | ICD-10-CM

## 2016-06-08 DIAGNOSIS — Z0001 Encounter for general adult medical examination with abnormal findings: Secondary | ICD-10-CM

## 2016-06-08 NOTE — Assessment & Plan Note (Signed)
stable overall by history and exam, recent data reviewed with pt, and pt to continue medical treatment as before,  to f/u any worsening symptoms or concerns BP Readings from Last 3 Encounters:  06/08/16 118/68  03/31/16 118/72  03/02/16 116/66

## 2016-06-08 NOTE — Assessment & Plan Note (Signed)
stable overall by history and exam, recent data reviewed with pt, and pt to continue medical treatment as before,  to f/u any worsening symptoms or concerns Lab Results  Component Value Date   HGBA1C 6.5 03/29/2016   For f/u 3 mo

## 2016-06-08 NOTE — Patient Instructions (Addendum)
You will be contacted regarding the referral for: Diabetic education  Please continue all other medications as before, and refills have been done if requested.  Please have the pharmacy call with any other refills you may need.  Please continue your efforts at being more active, low cholesterol diet, and weight control.  Please keep your appointments with your specialists as you may have planned  Please return in 3 months, or sooner if needed, with Lab testing done 3-5 days before

## 2016-06-08 NOTE — Progress Notes (Signed)
Subjective:    Patient ID: [name] [name], female    DOB: September 07, 1956, 60 y.o.   MRN: [id number]  HPI  Here to f/u with issue with health form for work, unable to qualify for better premium for work group health due to BMI > 29.9.  Pt has been unable to do this given recent prednisone tx.   Pt denies chest pain, increasing sob or doe, wheezing, orthopnea, PND, increased LE swelling, palpitations, dizziness or syncope.  Pt denies new neurological symptoms such as new headache, or facial or extremity weakness or numbness.  Pt denies polydipsia, polyuria, or low sugar episode.   Pt denies new neurological symptoms such as new headache, or facial or extremity weakness or numbness.   Pt states overall good compliance with meds, mostly trying to follow appropriate diet, with wt overall stable,  but little exercise however. Is trying to walk up to 10K steps per day, and working on reduced calorie diet, but prednisone really makes her eat more. Wt Readings from Last 3 Encounters:  06/08/16 180 lb (81.6 kg)  03/31/16 181 lb (82.1 kg)  01/29/16 178 lb (80.7 kg)   Past Medical History:  Diagnosis Date  . ALLERGIC RHINITIS 04/06/2007  . ANXIETY 04/06/2007  . Arthritis   . CIN I (cervical intraepithelial neoplasia I)   . COLONIC POLYPS 08/04/2007  . Diabetes mellitus without complication ([place])   . DIARRHEA, ACUTE 09/23/2010  . Fibroid   . GASTRIC POLYP 08/04/2007  . GERD 08/04/2007  . GOITER, MULTINODULAR 03/10/2009  . Headache(784.0) 11/13/2008  . Hx of blood clots   . HYPERLIPIDEMIA 04/06/2007  . HYPERTENSION 04/06/2007  . ISCHEMIC COLITIS, HX OF 07/06/2007  . Lichen sclerosus 3382  . Osteopenia 12/2012   T score -1.1 FRAX 5.9%/0.3%  . Palpitations 03/10/2009  . Rosacea 11/30/2007  . THYROID NODULE, LEFT 11/04/2010  . Urinary frequency   . UTI'S, RECURRENT 11/30/2007   Past Surgical History:  Procedure Laterality Date  . APPENDECTOMY  2005  . bladder tack    . COLPOSCOPY    . CYSTOSCOPY  2007    . DERMOID CYST  EXCISION  1994   right ovary  . FLEXIBLE SIGMOIDOSCOPY  11/16/95  . Lt. pectoral/subclavicular abscess drainage  2006  . removed skull cyst  1973  . right foot neuroma  1988  . uterine fibroids removed  1996    reports that she has never smoked. She does not have any smokeless tobacco history on file. She reports that she drinks alcohol. She reports that she does not use drugs. family history includes Breast cancer in her paternal aunt; Deep vein thrombosis in her brother; Diabetes in her mother; Emphysema in her father; Heart disease in her father and mother; Hypertension in her mother; Lung cancer in her maternal grandmother, maternal uncle, and mother; Stroke in her brother and father. Allergies  Allergen Reactions  . Atorvastatin     REACTION: extreme muscle aches  . Cefuroxime Axetil     REACTION: DIARRHEA  . Ciprofloxacin     REACTION: diarrhea  . Latex     REACTION: rash  . Lipitor [Atorvastatin Calcium] Other (See Comments)    sensitive  . Moxifloxacin     REACTION: DIARRHEA   Current Outpatient Prescriptions on File Prior to Visit  Medication Sig Dispense Refill  . aspirin 81 MG EC tablet Take 81 mg by mouth daily.      . Blood Glucose Monitoring Suppl (ONE TOUCH ULTRA 2) W/DEVICE KIT Use  as directed 1 per day 1 each 0  . cetirizine (ZYRTEC) 10 MG tablet Take 10 mg by mouth daily.      . clobetasol cream (TEMOVATE) 0.05 % Apply nightly as needed for irritation 30 g 2  . dextromethorphan-guaiFENesin (MUCINEX DM) 30-600 MG per 12 hr tablet Take 1 tablet by mouth 2 (two) times daily.    . Esomeprazole Magnesium (NEXIUM 24HR PO) Take by mouth daily.    . fluticasone (FLONASE) 50 MCG/ACT nasal spray Place 1 spray into both nostrils daily. 48 g 1  . glucose blood test strip Use as instructed 100 each 12  . Lancets MISC Use as directed 1 per day 100 each 12  . montelukast (SINGULAIR) 10 MG tablet Take 1 tablet (10 mg total) by mouth at bedtime. 90 tablet 3  .  Multiple Vitamins-Minerals (MULTIVITAMIN PO) Take by mouth daily.    . potassium chloride SA (K-DUR,KLOR-CON) 20 MEQ tablet Take 1 tablet by mouth two  times daily 180 tablet 0  . rosuvastatin (CRESTOR) 20 MG tablet Take 1 tablet by mouth  daily 90 tablet 0  . valsartan-hydrochlorothiazide (DIOVAN-HCT) 320-25 MG tablet Take 1 tablet by mouth  daily 90 tablet 1   No current facility-administered medications on file prior to visit.    Review of Systems  Constitutional: Negative for unusual diaphoresis or night sweats HENT: Negative for ear swelling or discharge Eyes: Negative for worsening visual haziness  Respiratory: Negative for choking and stridor.   Gastrointestinal: Negative for distension or worsening eructation Genitourinary: Negative for retention or change in urine volume.  Musculoskeletal: Negative for other MSK pain or swelling Skin: Negative for color change and worsening wound Neurological: Negative for tremors and numbness other than noted  Psychiatric/Behavioral: Negative for decreased concentration or agitation other than above       Objective:   Physical Exam BP 118/68   Pulse 93   Temp 98.2 F (36.8 C) (Oral)   Resp 20   Wt 180 lb (81.6 kg)   SpO2 90%   BMI 32.92 kg/m  VS noted, obese Constitutional: Pt appears in no apparent distress HENT: Head: NCAT.  Right Ear: External ear normal.  Left Ear: External ear normal.  Eyes: . Pupils are equal, round, and reactive to light. Conjunctivae and EOM are normal Neck: Normal range of motion. Neck supple.  Cardiovascular: Normal rate and regular rhythm.   Pulmonary/Chest: Effort normal and breath sounds without rales or wheezing.  Abd:  Soft, NT, ND, + BS Neurological: Pt is alert. Not confused , motor grossly intact Skin: Skin is warm. No rash, no LE edema Psychiatric: Pt behavior is normal. No agitation. not depressed affect    Assessment & Plan:

## 2016-06-08 NOTE — Assessment & Plan Note (Signed)
stable overall by history and exam, recent data reviewed with pt, and pt to continue medical treatment as before,  to f/u any worsening symptoms or concerns Lab Results  Component Value Date   LDLCALC 73 03/29/2016

## 2016-06-08 NOTE — Progress Notes (Signed)
Pre visit review using our clinic review tool, if applicable. No additional management support is needed unless otherwise documented below in the visit note. 

## 2016-07-12 ENCOUNTER — Encounter: Payer: Self-pay | Admitting: Endocrinology

## 2016-07-12 ENCOUNTER — Ambulatory Visit (INDEPENDENT_AMBULATORY_CARE_PROVIDER_SITE_OTHER): Payer: 59 | Admitting: Endocrinology

## 2016-07-12 VITALS — BP 116/68 | HR 76 | Ht 62.0 in | Wt 181.0 lb

## 2016-07-12 DIAGNOSIS — R0602 Shortness of breath: Secondary | ICD-10-CM

## 2016-07-12 DIAGNOSIS — E042 Nontoxic multinodular goiter: Secondary | ICD-10-CM

## 2016-07-12 NOTE — Progress Notes (Signed)
Subjective:    Patient ID: [name] [name], female    DOB: 07-09-1956, 60 y.o.   MRN: [id number]  HPI Pt is ref by Dr Jenny Reichmann, for multinodular goiter (dx'ed 2009).  She has had several biopsies--most recently in early 2017.  she has no h/o XRT or surgery to the neck.  She has slight sob, but no assoc pain at the anterior neck.   Past Medical History:  Diagnosis Date  . ALLERGIC RHINITIS 04/06/2007  . ANXIETY 04/06/2007  . Arthritis   . CIN I (cervical intraepithelial neoplasia I)   . COLONIC POLYPS 08/04/2007  . Diabetes mellitus without complication ([place])   . DIARRHEA, ACUTE 09/23/2010  . Fibroid   . GASTRIC POLYP 08/04/2007  . GERD 08/04/2007  . GOITER, MULTINODULAR 03/10/2009  . Headache(784.0) 11/13/2008  . Hx of blood clots   . HYPERLIPIDEMIA 04/06/2007  . HYPERTENSION 04/06/2007  . ISCHEMIC COLITIS, HX OF 07/06/2007  . Lichen sclerosus 2458  . Osteopenia 12/2012   T score -1.1 FRAX 5.9%/0.3%  . Palpitations 03/10/2009  . Rosacea 11/30/2007  . THYROID NODULE, LEFT 11/04/2010  . Urinary frequency   . UTI'S, RECURRENT 11/30/2007    Past Surgical History:  Procedure Laterality Date  . APPENDECTOMY  2005  . bladder tack    . COLPOSCOPY    . CYSTOSCOPY  2007  . DERMOID CYST  EXCISION  1994   right ovary  . FLEXIBLE SIGMOIDOSCOPY  11/16/95  . Lt. pectoral/subclavicular abscess drainage  2006  . removed skull cyst  1973  . right foot neuroma  1988  . uterine fibroids removed  1996    Social History   Social History  . Marital status: Single    Spouse name: N/A  . Number of children: N/A  . Years of education: N/A   Occupational History  . Not on file.   Social History Main Topics  . Smoking status: Never Smoker  . Smokeless tobacco: Not on file     Comment: passive tobacco smoke exposure as a child  . Alcohol use 0.0 oz/week     Comment: Rare  . Drug use: No  . Sexual activity: No     Comment: 1st intercourse 60 yo-Fewer than 5 partners   Other Topics Concern    . Not on file   Social History Narrative  . No narrative on file    Current Outpatient Prescriptions on File Prior to Visit  Medication Sig Dispense Refill  . aspirin 81 MG EC tablet Take 81 mg by mouth daily.      . Blood Glucose Monitoring Suppl (ONE TOUCH ULTRA 2) W/DEVICE KIT Use as directed 1 per day 1 each 0  . cetirizine (ZYRTEC) 10 MG tablet Take 10 mg by mouth daily.      . clobetasol cream (TEMOVATE) 0.05 % Apply nightly as needed for irritation 30 g 2  . dextromethorphan-guaiFENesin (MUCINEX DM) 30-600 MG per 12 hr tablet Take 1 tablet by mouth 2 (two) times daily.    . Esomeprazole Magnesium (NEXIUM 24HR PO) Take by mouth daily.    . fluticasone (FLONASE) 50 MCG/ACT nasal spray Place 1 spray into both nostrils daily. 48 g 1  . glucose blood test strip Use as instructed 100 each 12  . Lancets MISC Use as directed 1 per day 100 each 12  . montelukast (SINGULAIR) 10 MG tablet Take 1 tablet (10 mg total) by mouth at bedtime. 90 tablet 3  . Multiple Vitamins-Minerals (MULTIVITAMIN PO) Take  by mouth daily.    . potassium chloride SA (K-DUR,KLOR-CON) 20 MEQ tablet Take 1 tablet by mouth two  times daily 180 tablet 0  . rosuvastatin (CRESTOR) 20 MG tablet Take 1 tablet by mouth  daily 90 tablet 0  . valsartan-hydrochlorothiazide (DIOVAN-HCT) 320-25 MG tablet Take 1 tablet by mouth  daily 90 tablet 1   No current facility-administered medications on file prior to visit.     Allergies  Allergen Reactions  . Atorvastatin     REACTION: extreme muscle aches  . Cefuroxime Axetil     REACTION: DIARRHEA  . Ciprofloxacin     REACTION: diarrhea  . Latex     REACTION: rash  . Lipitor [Atorvastatin Calcium] Other (See Comments)    sensitive  . Moxifloxacin     REACTION: DIARRHEA    Family History  Problem Relation Age of Onset  . Diabetes Mother   . Hypertension Mother   . Heart disease Mother   . Lung cancer Mother   . Lung cancer Maternal Grandmother   . Heart disease  Father   . Stroke Father   . Emphysema Father   . Breast cancer Paternal Aunt     Age 40's  . Stroke Brother   . Deep vein thrombosis Brother   . Lung cancer Maternal Uncle     BP 116/68   Pulse 76   Ht 5' 2"  (1.575 m)   Wt 181 lb (82.1 kg)   SpO2 95%   BMI 33.11 kg/m    Review of Systems Denies weight change, hoarseness, visual loss, chest pain, cough, dysphagia, skin rash, easy bruising, depression, cold intolerance, headache, numbness, and rhinorrhea.      Objective:   Physical Exam VS: see vs page GEN: no distress HEAD: head: no deformity eyes: no periorbital swelling, no proptosis external nose and ears are normal mouth: no lesion seen NECK: thyroid is 5-10 times normal size, R>L, with bilat nodules. CHEST WALL: no deformity LUNGS: clear to auscultation CV: reg rate and rhythm, no murmur.   ABD: abdomen is soft, nontender.  no hepatosplenomegaly.  not distended.  no hernia MUSCULOSKELETAL: muscle bulk and strength are grossly normal.  no obvious joint swelling.  gait is normal and steady EXTEMITIES: no deformity.  no ulcer on the feet.  feet are of normal color and temp.  no edema.   PULSES: dorsalis pedis intact bilat.  no carotid bruit.  NEURO:  cn 2-12 grossly intact.   readily moves all 4's.  sensation is intact to touch on the feet.   SKIN:  Normal texture and temperature.  No rash or suspicious lesion is visible.   NODES:  None palpable at the neck.   PSYCH: alert, well-oriented.  Does not appear anxious nor depressed.   Cytol (2009): THYROID, RIGHT: The specimen consists predominately of follicular epithelialcells with oncocytic features, colloid, debris, and inflammatorycells. Cytologic features of papillary thyroid carcinoma are notidentified cytol (2012): LEFT: FINDINGS CONSISTENT WITH NON-NEOPLASTIC GOITER.  cytol (2017): THYROID, LEFT MIDDLE, FINE NEEDLE ASPIRATION (SPECIMEN 1 OF 1, COLLECTED ON 10/01/2015): FINDINGS CONSISTENT WITH BENIGN THYROID  NODULE (BETHESDA CATEGORY II).   [place]: Enlarging left lobe nodule now measuring 24 mm and previously measuring 17 mm.   Lab Results  Component Value Date   TSH 1.44 09/05/2015   Spirometry is not working  I have reviewed outside records, and summarized: Pt was seen to f/u DM.  The interval growth of the goiter was noted, and she was ref here.  Assessment & Plan:  Multinodular goiter, euthyroid. Despite low-risk biopsies, she should consider resection due to interval growth.   SOB, uncertain if thyroid-related.  Patient is advised the following: Patient Instructions  Please see a surgery specialist.  you will receive a phone call, about a day and time for an appointment.

## 2016-07-12 NOTE — Patient Instructions (Signed)
Please see a surgery specialist.  you will receive a phone call, about a day and time for an appointment.   ° °

## 2016-07-19 ENCOUNTER — Other Ambulatory Visit: Payer: Self-pay | Admitting: Internal Medicine

## 2016-07-19 DIAGNOSIS — Z1231 Encounter for screening mammogram for malignant neoplasm of breast: Secondary | ICD-10-CM

## 2016-08-05 ENCOUNTER — Other Ambulatory Visit: Payer: Self-pay | Admitting: Internal Medicine

## 2016-08-23 ENCOUNTER — Other Ambulatory Visit (INDEPENDENT_AMBULATORY_CARE_PROVIDER_SITE_OTHER): Payer: 59

## 2016-08-23 DIAGNOSIS — Z0001 Encounter for general adult medical examination with abnormal findings: Secondary | ICD-10-CM | POA: Diagnosis not present

## 2016-08-23 DIAGNOSIS — E119 Type 2 diabetes mellitus without complications: Secondary | ICD-10-CM | POA: Diagnosis not present

## 2016-08-23 LAB — HEPATIC FUNCTION PANEL
ALT: 34 U/L (ref 0–35)
AST: 28 U/L (ref 0–37)
Albumin: 4.4 g/dL (ref 3.5–5.2)
Alkaline Phosphatase: 93 U/L (ref 39–117)
BILIRUBIN DIRECT: 0.1 mg/dL (ref 0.0–0.3)
BILIRUBIN TOTAL: 0.5 mg/dL (ref 0.2–1.2)
Total Protein: 7.1 g/dL (ref 6.0–8.3)

## 2016-08-23 LAB — BASIC METABOLIC PANEL
BUN: 14 mg/dL (ref 6–23)
CALCIUM: 9.5 mg/dL (ref 8.4–10.5)
CO2: 27 meq/L (ref 19–32)
Chloride: 103 mEq/L (ref 96–112)
Creatinine, Ser: 0.74 mg/dL (ref 0.40–1.20)
GFR: 84.94 mL/min (ref >60.00)
Glucose, Bld: 114 mg/dL — ABNORMAL HIGH (ref 70–99)
Potassium: 3.7 mEq/L (ref 3.5–5.1)
SODIUM: 138 meq/L (ref 135–145)

## 2016-08-23 LAB — CBC WITH DIFFERENTIAL/PLATELET
BASOS PCT: 0.9 % (ref 0.0–3.0)
Basophils Absolute: 0.1 10*3/uL (ref 0.0–0.1)
EOS PCT: 4.7 % (ref 0.0–5.0)
Eosinophils Absolute: 0.4 10*3/uL (ref 0.0–0.7)
HEMATOCRIT: 38 % (ref 36.0–46.0)
HEMOGLOBIN: 12.9 g/dL (ref 12.0–15.0)
LYMPHS PCT: 37.3 % (ref 12.0–46.0)
Lymphs Abs: 2.8 10*3/uL (ref 0.7–4.0)
MCHC: 33.8 g/dL (ref 30.0–36.0)
MCV: 79.9 fl (ref 78.0–100.0)
Monocytes Absolute: 0.6 10*3/uL (ref 0.1–1.0)
Monocytes Relative: 8.3 % (ref 3.0–12.0)
NEUTROS ABS: 3.7 10*3/uL (ref 1.4–7.7)
Neutrophils Relative %: 48.8 % (ref 43.0–77.0)
Platelets: 233 10*3/uL (ref 150.0–400.0)
RBC: 4.76 Mil/uL (ref 3.87–5.11)
RDW: 13.5 % (ref 11.5–15.5)
WBC: 7.5 10*3/uL (ref 4.0–10.5)

## 2016-08-23 LAB — URINALYSIS, ROUTINE W REFLEX MICROSCOPIC
Bilirubin Urine: NEGATIVE
HGB URINE DIPSTICK: NEGATIVE
Ketones, ur: NEGATIVE
NITRITE: NEGATIVE
RBC / HPF: NONE SEEN (ref 0–?)
Specific Gravity, Urine: 1.01 (ref 1.000–1.030)
Total Protein, Urine: NEGATIVE
Urine Glucose: NEGATIVE
Urobilinogen, UA: 0.2 (ref 0.0–1.0)
pH: 6.5 (ref 5.0–8.0)

## 2016-08-23 LAB — LIPID PANEL
Cholesterol: 153 mg/dL (ref 0–200)
HDL: 38.7 mg/dL — ABNORMAL LOW (ref >39.00)
LDL Cholesterol: 76 mg/dL (ref 0–99)
NONHDL: 113.99
Total CHOL/HDL Ratio: 4
Triglycerides: 191 mg/dL — ABNORMAL HIGH (ref 0.0–149.0)
VLDL: 38.2 mg/dL (ref 0.0–40.0)

## 2016-08-23 LAB — MICROALBUMIN / CREATININE URINE RATIO
Creatinine,U: 71.7 mg/dL
MICROALB UR: 2.4 mg/dL — AB (ref 0.0–1.9)
MICROALB/CREAT RATIO: 3.3 mg/g (ref 0.0–30.0)

## 2016-08-23 LAB — TSH: TSH: 1 u[IU]/mL (ref 0.35–4.50)

## 2016-08-23 LAB — HEMOGLOBIN A1C: HEMOGLOBIN A1C: 6.7 % — AB (ref 4.6–6.5)

## 2016-08-25 ENCOUNTER — Ambulatory Visit
Admission: RE | Admit: 2016-08-25 | Discharge: 2016-08-25 | Disposition: A | Discharge status: Home / Self Care | Payer: 59 | Source: Ambulatory Visit | Attending: Internal Medicine | Admitting: Internal Medicine

## 2016-08-25 DIAGNOSIS — Z1231 Encounter for screening mammogram for malignant neoplasm of breast: Secondary | ICD-10-CM

## 2016-08-31 ENCOUNTER — Ambulatory Visit: Payer: 59 | Admitting: Internal Medicine

## 2016-09-01 ENCOUNTER — Ambulatory Visit: Payer: 59 | Admitting: Nurse Practitioner

## 2016-09-09 ENCOUNTER — Ambulatory Visit (INDEPENDENT_AMBULATORY_CARE_PROVIDER_SITE_OTHER): Payer: 59 | Admitting: Internal Medicine

## 2016-09-09 ENCOUNTER — Ambulatory Visit (INDEPENDENT_AMBULATORY_CARE_PROVIDER_SITE_OTHER)
Admission: RE | Admit: 2016-09-09 | Discharge: 2016-09-09 | Disposition: A | Discharge status: Home / Self Care | Payer: 59 | Source: Ambulatory Visit | Attending: Internal Medicine | Admitting: Internal Medicine

## 2016-09-09 ENCOUNTER — Encounter: Payer: Self-pay | Admitting: Internal Medicine

## 2016-09-09 VITALS — BP 128/76 | HR 88 | Temp 98.8°F | Resp 20 | Wt 179.0 lb

## 2016-09-09 DIAGNOSIS — R05 Cough: Secondary | ICD-10-CM

## 2016-09-09 DIAGNOSIS — I10 Essential (primary) hypertension: Secondary | ICD-10-CM | POA: Diagnosis not present

## 2016-09-09 DIAGNOSIS — R062 Wheezing: Secondary | ICD-10-CM | POA: Diagnosis not present

## 2016-09-09 DIAGNOSIS — R059 Cough, unspecified: Secondary | ICD-10-CM | POA: Insufficient documentation

## 2016-09-09 MED ORDER — HYDROCODONE-HOMATROPINE 5-1.5 MG/5ML PO SYRP: 5.0000 mL | ORAL_SOLUTION | Freq: Four times a day (QID) | ORAL | 0 refills | Status: DC | PRN

## 2016-09-09 MED ORDER — MONTELUKAST SODIUM 10 MG PO TABS: 10.0000 mg | ORAL_TABLET | Freq: Every day | ORAL | 3 refills | Status: DC

## 2016-09-09 MED ORDER — PREDNISONE 10 MG PO TABS: ORAL_TABLET | ORAL | 0 refills | Status: DC

## 2016-09-09 MED ORDER — LEVOFLOXACIN 500 MG PO TABS: 500.0000 mg | ORAL_TABLET | Freq: Every day | ORAL | 0 refills | Status: DC

## 2016-09-09 NOTE — Progress Notes (Signed)
Pre visit review using our clinic review tool, if applicable. No additional management support is needed unless otherwise documented below in the visit note. 

## 2016-09-09 NOTE — Patient Instructions (Signed)
You had the antibiotic shot today (rocephin)  Please take all new medication as prescribed - the prednisone, cough medicine if needed , and antibiotic (levaquin) - all local  Please continue all other medications as before, and refills have been done if requested - the singulair to [facility]  Please have the pharmacy call with any other refills you may need.  Please keep your appointments with your specialists as you may have planned  Please go to the XRAY Department in the Basement (go straight as you get off the elevator) for the x-ray testing  You will be contacted by phone if any changes need to be made immediately.  Otherwise, you will receive a letter about your results with an explanation, but please check with MyChart first.  Please remember to sign up for MyChart if you have not done so, as this will be important to you in the future with finding out test results, communicating by private email, and scheduling acute appointments online when needed.

## 2016-09-09 NOTE — Progress Notes (Signed)
Subjective:    Patient ID: [name] [name], female    DOB: [birth year], 60 y.o.   MRN: [id number]  HPI  Here after [facility] about dec 10 tx with IMsteorid and prednisone, wihtout improvement,  then dec 17 with second visit with abnormal chest exam with LLL rales, no cxr, tx with zpack but unfortuntately not improved . Still with marked feeling of ilness, genreally weak, legs feel want to give out, recurring sweats (no fever) , + cough productive (clearish) and stil mild wheeze and sob.   Chest is burning upper mid chest with cough, but o/w Pt denies orthopnea, PND, increased LE swelling, palpitations, dizziness or syncope. Also tried benadrryl, mucinex D but not working   Pt denies polydipsia, polyuria,  No other new history Past Medical History:  Diagnosis Date  . ALLERGIC RHINITIS 04/06/2007  . ANXIETY 04/06/2007  . Arthritis   . CIN I (cervical intraepithelial neoplasia I)   . COLONIC POLYPS 08/04/2007  . Diabetes mellitus without complication ([place])   . DIARRHEA, ACUTE 09/23/2010  . Fibroid   . GASTRIC POLYP 08/04/2007  . GERD 08/04/2007  . GOITER, MULTINODULAR 03/10/2009  . Headache(784.0) 11/13/2008  . Hx of blood clots   . HYPERLIPIDEMIA 04/06/2007  . HYPERTENSION 04/06/2007  . ISCHEMIC COLITIS, HX OF 07/06/2007  . Lichen sclerosus 123456  . Osteopenia 12/2012   T score -1.1 FRAX 5.9%/0.3%  . Palpitations 03/10/2009  . Rosacea 11/30/2007  . THYROID NODULE, LEFT 11/04/2010  . Urinary frequency   . UTI'S, RECURRENT 11/30/2007   Past Surgical History:  Procedure Laterality Date  . APPENDECTOMY  2005  . bladder tack    . COLPOSCOPY    . CYSTOSCOPY  2007  . DERMOID CYST  EXCISION  1994   right ovary  . FLEXIBLE SIGMOIDOSCOPY  11/16/95  . Lt. pectoral/subclavicular abscess drainage  2006  . removed skull cyst  1973  . right foot neuroma  1988  . uterine fibroids removed  1996    reports that she has never smoked. She does not have any smokeless tobacco history on file. She  reports that she drinks alcohol. She reports that she does not use drugs. family history includes Breast cancer in her paternal aunt; Deep vein thrombosis in her brother; Diabetes in her mother; Emphysema in her father; Heart disease in her father and mother; Hypertension in her mother; Lung cancer in her maternal grandmother, maternal uncle, and mother; Stroke in her brother and father. Allergies  Allergen Reactions  . Atorvastatin     REACTION: extreme muscle aches  . Cefuroxime Axetil     REACTION: DIARRHEA  . Ciprofloxacin     REACTION: diarrhea  . Latex     REACTION: rash  . Lipitor [Atorvastatin Calcium] Other (See Comments)    sensitive  . Moxifloxacin     REACTION: DIARRHEA   Review of Systems  Constitutional: Negative for unusual diaphoresis or night sweats HENT: Negative for ear swelling or discharge Eyes: Negative for worsening visual haziness  Respiratory: Negative for choking and stridor.   Gastrointestinal: Negative for distension or worsening eructation Genitourinary: Negative for retention or change in urine volume.  Musculoskeletal: Negative for other MSK pain or swelling Skin: Negative for color change and worsening wound Neurological: Negative for tremors and numbness other than noted  Psychiatric/Behavioral: Negative for decreased concentration or agitation other than above   All other system neg per pt    Objective:   Physical Exam BP 128/76  Pulse 88   Temp 98.8 F (37.1 C) (Oral)   Resp 20   Wt 179 lb (81.2 kg)   SpO2 97%   BMI 32.74 kg/m  VS noted, mild ill Constitutional: Pt appears in no apparent distress HENT: Head: NCAT.  Right Ear: External ear normal.  Left Ear: External ear normal.  Eyes: . Pupils are equal, round, and reactive to light. Conjunctivae and EOM are normal Neck: Normal range of motion. Neck supple.  Cardiovascular: Normal rate and regular rhythm.   Pulmonary/Chest: Effort normal and breath sounds decreased without rales but  with few mild scattered wheezing.  Neurological: Pt is alert. Not confused , motor grossly intact Skin: Skin is warm. No rash, no LE edema Psychiatric: Pt behavior is normal. No agitation.     Assessment & Plan:

## 2016-09-10 ENCOUNTER — Telehealth: Payer: Self-pay | Admitting: Internal Medicine

## 2016-09-10 NOTE — Telephone Encounter (Signed)
Patient called back.  Gave MD response on x ray.

## 2016-09-11 NOTE — Assessment & Plan Note (Signed)
/  Mild to mod, for predpac asd,  to f/u any worsening symptoms or concerns 

## 2016-09-11 NOTE — Assessment & Plan Note (Signed)
stable overall by history and exam, recent data reviewed with pt, and pt to continue medical treatment as before,  to f/u any worsening symptoms or concerns BP Readings from Last 3 Encounters:  09/09/16 128/76  07/12/16 116/68  06/08/16 118/68

## 2016-09-11 NOTE — Assessment & Plan Note (Addendum)
Likely c/w persistent lower resp infection, for rocephin IM 1 gm, cough med prn, levaquin asd,  For cxr, to f/u any worsening symptoms or concerns

## 2016-09-17 ENCOUNTER — Telehealth: Payer: Self-pay

## 2016-09-17 ENCOUNTER — Encounter: Payer: Self-pay | Admitting: Internal Medicine

## 2016-09-17 ENCOUNTER — Ambulatory Visit (INDEPENDENT_AMBULATORY_CARE_PROVIDER_SITE_OTHER): Payer: 59 | Admitting: Internal Medicine

## 2016-09-17 VITALS — BP 130/80 | HR 80 | Temp 98.1°F | Resp 20 | Wt 182.0 lb

## 2016-09-17 DIAGNOSIS — I10 Essential (primary) hypertension: Secondary | ICD-10-CM

## 2016-09-17 DIAGNOSIS — E119 Type 2 diabetes mellitus without complications: Secondary | ICD-10-CM | POA: Diagnosis not present

## 2016-09-17 DIAGNOSIS — J329 Chronic sinusitis, unspecified: Secondary | ICD-10-CM

## 2016-09-17 DIAGNOSIS — Z0001 Encounter for general adult medical examination with abnormal findings: Secondary | ICD-10-CM | POA: Diagnosis not present

## 2016-09-17 DIAGNOSIS — Z860100 Personal history of colon polyps, unspecified: Secondary | ICD-10-CM

## 2016-09-17 MED ORDER — FLUTICASONE PROPIONATE 50 MCG/ACT NA SUSP: 1.0000 | Freq: Every day | NASAL | 1 refills | Status: DC

## 2016-09-17 MED ORDER — ROSUVASTATIN CALCIUM 20 MG PO TABS: 20.0000 mg | ORAL_TABLET | Freq: Every day | ORAL | 0 refills | Status: DC

## 2016-09-17 MED ORDER — AMOXICILLIN-POT CLAVULANATE 875-125 MG PO TABS: 1.0000 | ORAL_TABLET | Freq: Two times a day (BID) | ORAL | 0 refills | Status: DC

## 2016-09-17 MED ORDER — VALSARTAN-HYDROCHLOROTHIAZIDE 320-25 MG PO TABS: 1.0000 | ORAL_TABLET | Freq: Every day | ORAL | 0 refills | Status: DC

## 2016-09-17 MED ORDER — NITROFURANTOIN MACROCRYSTAL 50 MG PO CAPS: 50.0000 mg | ORAL_CAPSULE | Freq: Every day | ORAL | 3 refills | Status: DC

## 2016-09-17 MED ORDER — POTASSIUM CHLORIDE CRYS ER 20 MEQ PO TBCR: 20.0000 meq | EXTENDED_RELEASE_TABLET | Freq: Two times a day (BID) | ORAL | 0 refills | Status: DC

## 2016-09-17 NOTE — Assessment & Plan Note (Addendum)
With persistent significant pain in a normally more stoic person, to conisder ct sinus but overall improved, will add augmentin course but if not improved may need imaging and/or ENT referral  In addition to the time spent performing CPE, I spent an additional 15 minutes face to face,in which greater than 50% of this time was spent in counseling and coordination of care for patient's illness as documented.

## 2016-09-17 NOTE — Assessment & Plan Note (Signed)
Due for f/u colonoscopy, will refer 

## 2016-09-17 NOTE — Assessment & Plan Note (Signed)
stable overall by history and exam, recent data reviewed with pt, and pt to continue medical treatment as before,  to f/u any worsening symptoms or concerns BP Readings from Last 3 Encounters:  09/17/16 130/80  09/09/16 128/76  07/12/16 116/68

## 2016-09-17 NOTE — Telephone Encounter (Signed)
-----   Message from Biagio Borg, MD sent at 09/17/2016 10:48 AM EST ----- Regarding: med refill Please to do one yr refills on all med except for the 2 antibiotics  thanks
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
## 2016-09-17 NOTE — Patient Instructions (Signed)
Please take all new medication as prescribed - the antibiotic  Please continue all other medications as before, and refills have been done if requested.  Please have the pharmacy call with any other refills you may need.  Please continue your efforts at being more active, low cholesterol diet, and weight control.  You will be contacted regarding the referral for: colonoscopy  You are otherwise up to date with prevention measures today.  Please keep your appointments with your specialists as you may have planned  Please return in 6 months, or sooner if needed, with Lab testing done 3-5 days before

## 2016-09-17 NOTE — Assessment & Plan Note (Signed)
stable overall by history and exam, recent data reviewed with pt, and pt to continue medical treatment as before,  to f/u any worsening symptoms or concerns Lab Results  Component Value Date   HGBA1C 6.7 (H) 08/23/2016

## 2016-09-17 NOTE — Progress Notes (Signed)
Pre visit review using our clinic review tool, if applicable. No additional management support is needed unless otherwise documented below in the visit note. 

## 2016-10-05 ENCOUNTER — Ambulatory Visit (INDEPENDENT_AMBULATORY_CARE_PROVIDER_SITE_OTHER): Payer: 59 | Admitting: Internal Medicine

## 2016-10-05 ENCOUNTER — Encounter: Payer: Self-pay | Admitting: Internal Medicine

## 2016-10-05 VITALS — BP 122/80 | HR 72 | Ht 62.0 in | Wt 179.1 lb

## 2016-10-05 DIAGNOSIS — K219 Gastro-esophageal reflux disease without esophagitis: Secondary | ICD-10-CM

## 2016-10-05 DIAGNOSIS — Z86010 Personal history of colonic polyps: Secondary | ICD-10-CM | POA: Diagnosis not present

## 2016-10-05 NOTE — Patient Instructions (Signed)

## 2016-10-07 ENCOUNTER — Encounter: Payer: Self-pay | Admitting: Internal Medicine

## 2016-10-07 ENCOUNTER — Other Ambulatory Visit: Payer: Self-pay | Admitting: Internal Medicine

## 2016-10-07 NOTE — Progress Notes (Signed)
HISTORY OF PRESENT ILLNESS:  [name] [name] is a 61 y.o. female who sent by her primary care provider Dr. Jenny Reichmann with chief complaint of needing surveillance colonoscopy. Patient has a history of GERD and adenomatous colon polyp identified on index colonoscopy November 2008. Follow-up in 5 years recommended. She has not received recall letter. The patient has no lower GI complaints. She is known to have chronic GERD and has undergone prior upper endoscopy. Benign fundic gland polyps noted. Otherwise normal exam. For her GERD she continues on Nexium OTC on demand. No dysphagia. He is pleased. She does have diabetes mellitus which is currently diet controlled.  REVIEW OF SYSTEMS:  All non-GI ROS negative except for sinus and allergy trouble, muscle cramps, sore throat, ankle swelling, lymph glands, urinary leakage  Past Medical History:  Diagnosis Date  . ALLERGIC RHINITIS 04/06/2007  . ANXIETY 04/06/2007  . Arthritis   . CIN I (cervical intraepithelial neoplasia I)   . COLONIC POLYPS 08/04/2007  . Diabetes mellitus without complication ([place])   . DIARRHEA, ACUTE 09/23/2010  . Fibroid   . GASTRIC POLYP 08/04/2007  . GERD 08/04/2007  . GOITER, MULTINODULAR 03/10/2009  . Headache(784.0) 11/13/2008  . Hx of blood clots   . HYPERLIPIDEMIA 04/06/2007  . HYPERTENSION 04/06/2007  . ISCHEMIC COLITIS, HX OF 07/06/2007  . Lichen sclerosus 123456  . Osteopenia 12/2012   T score -1.1 FRAX 5.9%/0.3%  . Palpitations 03/10/2009  . Rosacea 11/30/2007  . THYROID NODULE, LEFT 11/04/2010  . Urinary frequency   . UTI'S, RECURRENT 11/30/2007    Past Surgical History:  Procedure Laterality Date  . APPENDECTOMY  2005  . bladder tack    . COLPOSCOPY    . CYSTOSCOPY  2007  . DERMOID CYST  EXCISION  1994   right ovary  . FLEXIBLE SIGMOIDOSCOPY  11/16/95  . Lt. pectoral/subclavicular abscess drainage  2006  . removed skull cyst  1973  . right foot neuroma  1988  . uterine fibroids removed  [place]  reports that she has never smoked. She has never used smokeless tobacco. She reports that she drinks alcohol. She reports that she does not use drugs.  family history includes Breast cancer in her paternal aunt; Deep vein thrombosis in her brother; Diabetes in her mother; Emphysema in her father; Heart disease in her father and mother; Hypertension in her mother; Lung cancer in her maternal grandmother, maternal uncle, and mother; Stroke in her brother and father.  Allergies  Allergen Reactions  . Atorvastatin     REACTION: extreme muscle aches  . Cefuroxime Axetil     REACTION: DIARRHEA  . Ciprofloxacin     REACTION: diarrhea  . Latex     REACTION: rash  . Lipitor [Atorvastatin Calcium] Other (See Comments)    sensitive  . Moxifloxacin     REACTION: DIARRHEA       PHYSICAL EXAMINATION: Vital signs: BP 122/80   Pulse 72   Ht 5\' 2"  (1.575 m)   Wt 179 lb 2 oz (81.3 kg)   BMI 32.76 kg/m   Constitutional: generally well-appearing, no acute distress Psychiatric: alert and oriented x3, cooperative Eyes: extraocular movements intact, anicteric, conjunctiva pink Mouth: oral pharynx moist, no lesions Neck: supple no lymphadenopathy Cardiovascular: heart regular rate and rhythm, no murmur Lungs: clear to auscultation bilaterally Abdomen: soft, nontender, nondistended, no obvious ascites, no peritoneal signs, normal bowel sounds, no organomegaly Rectal:Deferred until colonoscopy Extremities: no lower extremity edema bilaterally  Skin: no lesions on visible extremities Neuro: No focal deficits. No asterixis.    ASSESSMENT:  #1. History of adenomatous colon polyp. Due for surveillance #2. GERD. Prior EGD without Barrett's. Symptoms controlled with on demand PPI   PLAN:  #1. Reflux precautions #2. Continue on demand PPI #3. Schedule surveillance colonoscopy.The nature of the procedure, as well as the risks, benefits, and alternatives were carefully  and thoroughly reviewed with the patient. Ample time for discussion and questions allowed. The patient understood, was satisfied, and agreed to proceed.   A copy of this consultation note has been sent to Dr. Jenny Reichmann

## 2016-11-11 ENCOUNTER — Ambulatory Visit (INDEPENDENT_AMBULATORY_CARE_PROVIDER_SITE_OTHER)
Admission: RE | Admit: 2016-11-11 | Discharge: 2016-11-11 | Disposition: A | Discharge status: Home / Self Care | Payer: 59 | Source: Ambulatory Visit | Attending: Pulmonary Disease | Admitting: Pulmonary Disease

## 2016-11-11 ENCOUNTER — Ambulatory Visit (INDEPENDENT_AMBULATORY_CARE_PROVIDER_SITE_OTHER): Payer: 59 | Admitting: Pulmonary Disease

## 2016-11-11 ENCOUNTER — Encounter: Payer: Self-pay | Admitting: Pulmonary Disease

## 2016-11-11 VITALS — BP 122/64 | HR 81 | Ht 62.0 in | Wt 183.4 lb

## 2016-11-11 DIAGNOSIS — R9389 Abnormal findings on diagnostic imaging of other specified body structures: Secondary | ICD-10-CM

## 2016-11-11 DIAGNOSIS — R938 Abnormal findings on diagnostic imaging of other specified body structures: Secondary | ICD-10-CM

## 2016-11-11 DIAGNOSIS — R0609 Other forms of dyspnea: Secondary | ICD-10-CM

## 2016-11-11 DIAGNOSIS — R0600 Dyspnea, unspecified: Secondary | ICD-10-CM

## 2016-11-11 DIAGNOSIS — J3089 Other allergic rhinitis: Secondary | ICD-10-CM

## 2016-11-11 DIAGNOSIS — K219 Gastro-esophageal reflux disease without esophagitis: Secondary | ICD-10-CM | POA: Diagnosis not present

## 2016-11-11 DIAGNOSIS — R918 Other nonspecific abnormal finding of lung field: Secondary | ICD-10-CM | POA: Diagnosis not present

## 2016-11-11 NOTE — Patient Instructions (Signed)
For your allergic rhinitis: Keep taking the medicine 0 taking now We will refer you to [facility] in [place]  For the scarring seen on your chest x-ray and your shortness of breath: We will check a chest x-ray We will check a high-resolution CT scan of the chest in [place]  For the acid reflux: Follow the lifestyle modification sheet we gave you Take over-the-counter Zantac or Pepcid as directed  We will see you back in 3-4 weeks to go over these results

## 2016-11-11 NOTE — Addendum Note (Signed)
Addended by: Virl Cagey on: 11/11/2016 01:07 PM   Modules accepted: Orders

## 2016-11-11 NOTE — Progress Notes (Signed)
Subjective:    Patient ID: [name] [name], female    DOB: 06/30/1956, 61 y.o.   MRN: [id number]  HPI Chief Complaint  Patient presents with  . pulmonary consult    self ref. concerned about findings on CXR 09/09/16. pt c/o occ non prod cough & sob with exertion.     [name] [name] is here to see me to discuss allergy problems and an abnormal CXR.  Allergies: > she takes mucinex, flonase, Zyzol, saline rinses > despite this she still has significant sinus congestion > she notes a constant post nasal drip > she had allergy testing and was told she was allergic to "everying", had this when she was in her 30's > she also had skin testing with Dr. Annamaria Boots > allergy shots helped again  GERD: > she notes flares from time to time > uses prilosec prn which helps > pizza makes it worse  Abnormal CXR: > she had scarring in her left base seen on a CXR when she had a cough and congestion > she has been on macrobid for 2-3 years for recurrent UTIs > she says that she has some DYSPNEA when she is carrying in groceries or objects for work > walking on level ground is not a problem > she tries to walk for exercise she feels OK unless climbing hills     Past Medical History:  Diagnosis Date  . ALLERGIC RHINITIS 04/06/2007  . ANXIETY 04/06/2007  . Arthritis   . CIN I (cervical intraepithelial neoplasia I)   . COLONIC POLYPS 08/04/2007  . Diabetes mellitus without complication ([place])   . DIARRHEA, ACUTE 09/23/2010  . Fibroid   . GASTRIC POLYP 08/04/2007  . GERD 08/04/2007  . GOITER, MULTINODULAR 03/10/2009  . Headache(784.0) 11/13/2008  . Hx of blood clots   . HYPERLIPIDEMIA 04/06/2007  . HYPERTENSION 04/06/2007  . ISCHEMIC COLITIS, HX OF 07/06/2007  . Lichen sclerosus 7846  . Osteopenia 12/2012   T score -1.1 FRAX 5.9%/0.3%  . Palpitations 03/10/2009  . Rosacea 11/30/2007  . THYROID NODULE, LEFT 11/04/2010  . Urinary frequency   . UTI'S, RECURRENT 11/30/2007     Family History  Problem  Relation Age of Onset  . Stroke Brother   . Deep vein thrombosis Brother   . Diabetes Mother   . Hypertension Mother   . Heart disease Mother   . Lung cancer Mother   . Lung cancer Maternal Grandmother   . Heart disease Father   . Stroke Father   . Emphysema Father   . Breast cancer Paternal Aunt     Age 73's  . Lung cancer Maternal Uncle   . Colon cancer Neg Hx   . Stomach cancer Neg Hx   . Rectal cancer Neg Hx   . Esophageal cancer Neg Hx   . Liver cancer Neg Hx      Social History   Social History  . Marital status: Single    Spouse name: N/A  . Number of children: N/A  . Years of education: N/A   Occupational History  . Not on file.   Social History Main Topics  . Smoking status: Never Smoker  . Smokeless tobacco: Never Used     Comment: passive tobacco smoke exposure as a child  . Alcohol use 0.0 oz/week     Comment: Rare  . Drug use: No  . Sexual activity: No     Comment: 1st intercourse 61 yo-Fewer than 5 partners   Other Topics Concern  .  Not on file   Social History Narrative  . No narrative on file     Allergies  Allergen Reactions  . Atorvastatin     REACTION: extreme muscle aches  . Cefuroxime Axetil     REACTION: DIARRHEA  . Ciprofloxacin     REACTION: diarrhea  . Latex     REACTION: rash  . Lipitor [Atorvastatin Calcium] Other (See Comments)    sensitive  . Moxifloxacin     REACTION: DIARRHEA     Outpatient Medications Prior to Visit  Medication Sig Dispense Refill  . aspirin 81 MG EC tablet Take 81 mg by mouth daily.      . Blood Glucose Monitoring Suppl (ONE TOUCH ULTRA 2) W/DEVICE KIT Use as directed 1 per day 1 each 0  . cetirizine (ZYRTEC) 10 MG tablet Take 10 mg by mouth daily.      . clobetasol cream (TEMOVATE) 0.05 % Apply nightly as needed for irritation 30 g 2  . dextromethorphan-guaiFENesin (MUCINEX DM) 30-600 MG per 12 hr tablet Take 1 tablet by mouth 2 (two) times daily.    . Esomeprazole Magnesium (NEXIUM 24HR PO)  Take by mouth daily.    . fluticasone (FLONASE) 50 MCG/ACT nasal spray Place 1 spray into both nostrils daily. 48 g 1  . glucose blood test strip Use as instructed 100 each 12  . Lancets MISC Use as directed 1 per day 100 each 12  . montelukast (SINGULAIR) 10 MG tablet Take 1 tablet (10 mg total) by mouth at bedtime. 90 tablet 3  . Multiple Vitamins-Minerals (MULTIVITAMIN PO) Take by mouth daily.    . potassium chloride SA (K-DUR,KLOR-CON) 20 MEQ tablet TAKE 1 TABLET BY MOUTH TWO  TIMES DAILY 180 tablet 2  . rosuvastatin (CRESTOR) 20 MG tablet TAKE 1 TABLET BY MOUTH  DAILY 90 tablet 2  . valsartan-hydrochlorothiazide (DIOVAN-HCT) 320-25 MG tablet TAKE 1 TABLET BY MOUTH  DAILY 90 tablet 2  . nitrofurantoin (MACRODANTIN) 50 MG capsule Take 1 capsule (50 mg total) by mouth at bedtime. (Patient not taking: Reported on 11/11/2016) 90 capsule 3   No facility-administered medications prior to visit.       Review of Systems  Constitutional: Negative for fever and unexpected weight change.  HENT: Positive for congestion, sinus pressure and sore throat. Negative for dental problem, ear pain, nosebleeds, postnasal drip, rhinorrhea, sneezing and trouble swallowing.   Eyes: Negative for redness and itching.  Respiratory: Positive for cough and shortness of breath. Negative for chest tightness and wheezing.   Cardiovascular: Negative for palpitations and leg swelling.  Gastrointestinal: Negative for nausea and vomiting.  Genitourinary: Negative for dysuria.  Musculoskeletal: Negative for joint swelling.  Skin: Negative for rash.  Neurological: Negative for headaches.  Hematological: Does not bruise/bleed easily.  Psychiatric/Behavioral: Negative for dysphoric mood. The patient is not nervous/anxious.        Objective:   Physical Exam  Vitals:   11/11/16 1201  BP: 122/64  Pulse: 81  SpO2: 95%  Weight: 183 lb 6.4 oz (83.2 kg)  Height: 5' 2"  (1.575 m)   RA  Gen: well appearing, no acute  distress HENT: NCAT, OP clear, neck supple without masses Eyes: PERRL, EOMi Lymph: no cervical lymphadenopathy PULM: CTA B CV: RRR, no mgr, no JVD GI: BS+, soft, nontender, no hsm Derm: no rash or skin breakdown MSK: normal bulk and tone Neuro: A&Ox4, CN II-XII intact, strength 5/5 in all 4 extremities Psyche: normal mood and affect  December 2017 chest x-ray  images personally reviewed showing normal pulmonary parenchyma with the exception of what appears to be 1 thick and airway the left lower lobe cardiac silhouette is normal  Records from her primary care doctor's office visit in December 2017 reviewed were she was cared for for diabetes and hypertension.      Assessment & Plan:   Impression: Dyspnea Abnormal chest x-ray GERD [place] allergic rhinitis  Discussion: Ariza is here because she is concerned about an abnormal chest x-ray which showed some scarring in the left lower lobe. The lesion in particular looks like a thickened airway which may have been due to acute bronchitis or representative of prior scarring from an earlier infection. I looked back at the CT scan of her abdomen from 2014 and in the lung windows there was a slight area of atelectasis in the left lower lobe really nothing else. My suspicion is that the abnormality in the chest x-ray has nothing to do with the Macrobid and is just reflective of a prior infection. However, for the time being she will hold off on taking further Macrobid which I think is reasonable.  She also has severe allergic rhinitis despite taking maximal medical therapy with good compliance. We will refer her to our allergy partners for further evaluation.  For your allergic rhinitis: Keep taking the medicine 0 taking now We will refer you to [facility] in [place]  For the scarring seen on your chest x-ray and your shortness of breath: We will check a chest x-ray We will check a high-resolution CT scan of the chest in  [place]  For the acid reflux: Follow the lifestyle modification sheet we gave you Take over-the-counter Zantac or Pepcid as directed  We will see you back in 3-4 weeks to go over these results    Current Outpatient Prescriptions:  .  aspirin 81 MG EC tablet, Take 81 mg by mouth daily.  , Disp: , Rfl:  .  Blood Glucose Monitoring Suppl (ONE TOUCH ULTRA 2) W/DEVICE KIT, Use as directed 1 per day, Disp: 1 each, Rfl: 0 .  cetirizine (ZYRTEC) 10 MG tablet, Take 10 mg by mouth daily.  , Disp: , Rfl:  .  clobetasol cream (TEMOVATE) 0.05 %, Apply nightly as needed for irritation, Disp: 30 g, Rfl: 2 .  dextromethorphan-guaiFENesin (MUCINEX DM) 30-600 MG per 12 hr tablet, Take 1 tablet by mouth 2 (two) times daily., Disp: , Rfl:  .  Esomeprazole Magnesium (NEXIUM 24HR PO), Take by mouth daily., Disp: , Rfl:  .  fluticasone (FLONASE) 50 MCG/ACT nasal spray, Place 1 spray into both nostrils daily., Disp: 48 g, Rfl: 1 .  glucose blood test strip, Use as instructed, Disp: 100 each, Rfl: 12 .  Lancets MISC, Use as directed 1 per day, Disp: 100 each, Rfl: 12 .  montelukast (SINGULAIR) 10 MG tablet, Take 1 tablet (10 mg total) by mouth at bedtime., Disp: 90 tablet, Rfl: 3 .  Multiple Vitamins-Minerals (MULTIVITAMIN PO), Take by mouth daily., Disp: , Rfl:  .  potassium chloride SA (K-DUR,KLOR-CON) 20 MEQ tablet, TAKE 1 TABLET BY MOUTH TWO  TIMES DAILY, Disp: 180 tablet, Rfl: 2 .  rosuvastatin (CRESTOR) 20 MG tablet, TAKE 1 TABLET BY MOUTH  DAILY, Disp: 90 tablet, Rfl: 2 .  valsartan-hydrochlorothiazide (DIOVAN-HCT) 320-25 MG tablet, TAKE 1 TABLET BY MOUTH  DAILY, Disp: 90 tablet, Rfl: 2 .  nitrofurantoin (MACRODANTIN) 50 MG capsule, Take 1 capsule (50 mg total) by mouth at bedtime. (Patient not taking: Reported  on 11/11/2016), Disp: 90 capsule, Rfl: 3

## 2016-11-17 DIAGNOSIS — R350 Frequency of micturition: Secondary | ICD-10-CM | POA: Diagnosis not present

## 2016-11-17 DIAGNOSIS — N3020 Other chronic cystitis without hematuria: Secondary | ICD-10-CM | POA: Diagnosis not present

## 2016-11-22 ENCOUNTER — Ambulatory Visit (INDEPENDENT_AMBULATORY_CARE_PROVIDER_SITE_OTHER)
Admission: RE | Admit: 2016-11-22 | Discharge: 2016-11-22 | Disposition: A | Discharge status: Home / Self Care | Payer: 59 | Source: Ambulatory Visit | Attending: Pulmonary Disease | Admitting: Pulmonary Disease

## 2016-11-22 DIAGNOSIS — R0609 Other forms of dyspnea: Secondary | ICD-10-CM | POA: Diagnosis not present

## 2016-11-22 DIAGNOSIS — R938 Abnormal findings on diagnostic imaging of other specified body structures: Secondary | ICD-10-CM | POA: Diagnosis not present

## 2016-11-22 DIAGNOSIS — R0602 Shortness of breath: Secondary | ICD-10-CM | POA: Diagnosis not present

## 2016-11-22 DIAGNOSIS — R9389 Abnormal findings on diagnostic imaging of other specified body structures: Secondary | ICD-10-CM

## 2016-11-22 DIAGNOSIS — R0600 Dyspnea, unspecified: Secondary | ICD-10-CM

## 2016-11-24 ENCOUNTER — Telehealth: Payer: Self-pay | Admitting: Internal Medicine

## 2016-11-24 MED ORDER — NA SULFATE-K SULFATE-MG SULF 17.5-3.13-1.6 GM/177ML PO SOLN: 1.0000 | Freq: Once | ORAL | 0 refills | Status: AC

## 2016-11-24 NOTE — Telephone Encounter (Signed)
Left message on voicemail that I had sent her Prep to the [facility] on [facility]

## 2016-12-07 ENCOUNTER — Ambulatory Visit: Payer: 59 | Attending: Pulmonary Disease

## 2016-12-07 DIAGNOSIS — R0600 Dyspnea, unspecified: Secondary | ICD-10-CM

## 2016-12-07 DIAGNOSIS — R0609 Other forms of dyspnea: Secondary | ICD-10-CM

## 2016-12-07 MED ORDER — ALBUTEROL SULFATE (2.5 MG/3ML) 0.083% IN NEBU
2.5000 mg | INHALATION_SOLUTION | Freq: Once | RESPIRATORY_TRACT | Status: AC
  Administered 2016-12-07: 2.5 mg via RESPIRATORY_TRACT
  Filled 2016-12-07: qty 3

## 2016-12-09 DIAGNOSIS — R05 Cough: Secondary | ICD-10-CM | POA: Diagnosis not present

## 2016-12-09 DIAGNOSIS — J309 Allergic rhinitis, unspecified: Secondary | ICD-10-CM | POA: Diagnosis not present

## 2016-12-15 ENCOUNTER — Encounter: Payer: Self-pay | Admitting: Internal Medicine

## 2016-12-15 ENCOUNTER — Ambulatory Visit (AMBULATORY_SURGERY_CENTER): Payer: 59 | Admitting: Internal Medicine

## 2016-12-15 VITALS — BP 144/81 | HR 80 | Temp 97.8°F | Resp 20 | Ht 62.4 in | Wt 179.0 lb

## 2016-12-15 DIAGNOSIS — Z86010 Personal history of colonic polyps: Secondary | ICD-10-CM | POA: Diagnosis present

## 2016-12-15 DIAGNOSIS — K635 Polyp of colon: Secondary | ICD-10-CM

## 2016-12-15 DIAGNOSIS — D123 Benign neoplasm of transverse colon: Secondary | ICD-10-CM

## 2016-12-15 DIAGNOSIS — Z1211 Encounter for screening for malignant neoplasm of colon: Secondary | ICD-10-CM | POA: Diagnosis not present

## 2016-12-15 MED ORDER — SODIUM CHLORIDE 0.9 % IV SOLN: 500.0000 mL | INTRAVENOUS | Status: DC

## 2016-12-15 NOTE — Op Note (Signed)
[place] Patient Name: [name] [name] Procedure Date: 12/15/2016 1:20 PM MRN: [id number] Endoscopist: Docia Chuck. Henrene Pastor , MD Age: 61 Referring MD:  Date of Birth: 1955/11/01 Gender: Female Account #: [id number] Procedure:                Colonoscopy with cold snare x 1 Indications:              Surveillance: Personal history of adenomatous                            polyps on last colonoscopy > 5 years ago, High risk                            colon cancer surveillance: Personal history of                            non-advanced adenoma. index exam 2008 Medicines:                Monitored Anesthesia Care Procedure:                Pre-Anesthesia Assessment:                           - Prior to the procedure, a History and Physical                            was performed, and patient medications and                            allergies were reviewed. The patient's tolerance of                            previous anesthesia was also reviewed. The risks                            and benefits of the procedure and the sedation                            options and risks were discussed with the patient.                            All questions were answered, and informed consent                            was obtained. Prior Anticoagulants: The patient has                            taken no previous anticoagulant or antiplatelet                            agents. ASA Grade Assessment: II - A patient with                            mild systemic disease. After reviewing the risks  and benefits, the patient was deemed in                            satisfactory condition to undergo the procedure.                           After obtaining informed consent, the colonoscope                            was passed under direct vision. Throughout the                            procedure, the patient's blood pressure, pulse, and                            oxygen  saturations were monitored continuously. The                            Colonoscope was introduced through the anus and                            advanced to the the cecum, identified by                            appendiceal orifice and ileocecal valve. The                            ileocecal valve, appendiceal orifice, and rectum                            were photographed. The quality of the bowel                            preparation was excellent. The colonoscopy was                            performed without difficulty. The patient tolerated                            the procedure well. The bowel preparation used was                            SUPREP. Scope In: 1:43:48 PM Scope Out: 1:59:49 PM Scope Withdrawal Time: 0 hours 12 minutes 44 seconds  Total Procedure Duration: 0 hours 16 minutes 1 second  Findings:                 A 1 mm polyp was found in the distal transverse                            colon. The polyp was removed with a cold snare.                            Resection and retrieval were complete.  A few diverticula were found in the sigmoid colon                            and right colon.                           Internal hemorrhoids were found during retroflexion.                           The exam was otherwise without abnormality on                            direct and retroflexion views. Complications:            No immediate complications. Estimated blood loss:                            None. Estimated Blood Loss:     Estimated blood loss: none. Impression:               - One 1 mm polyp in the distal transverse colon,                            removed with a cold snare. Resected and retrieved.                           - Diverticulosis in the sigmoid colon and in the                            right colon.                           - Internal hemorrhoids.                           - The examination was otherwise normal on  direct                            and retroflexion views. Recommendation:           - Repeat colonoscopy in 5-10 years for surveillance.                           - Patient has a contact number available for                            emergencies. The signs and symptoms of potential                            delayed complications were discussed with the                            patient. Return to normal activities tomorrow.                            Written discharge instructions were provided to the  patient.                           - Resume previous diet.                           - Continue present medications.                           - Await pathology results. Docia Chuck. Henrene Pastor, MD 12/15/2016 2:10:22 PM This report has been signed electronically.

## 2016-12-15 NOTE — Progress Notes (Signed)
Called to room to assist during endoscopic procedure.  Patient ID and intended procedure confirmed with present staff. Received instructions for my participation in the procedure from the performing physician.  

## 2016-12-15 NOTE — Patient Instructions (Addendum)
Handouts given on polyps, diverticulosis and hemorrhoids   YOU HAD AN ENDOSCOPIC PROCEDURE TODAY: Refer to the procedure report and other information in the discharge instructions given to you for any specific questions about what was found during the examination. If this information does not answer your questions, please call [place] office at [phone] to clarify.   YOU SHOULD EXPECT: Some feelings of bloating in the abdomen. Passage of more gas than usual. Walking can help get rid of the air that was put into your GI tract during the procedure and reduce the bloating. If you had a lower endoscopy (such as a colonoscopy or flexible sigmoidoscopy) you may notice spotting of blood in your stool or on the toilet paper. Some abdominal soreness may be present for a day or two, also.  DIET: Your first meal following the procedure should be a light meal and then it is ok to progress to your normal diet. A half-sandwich or bowl of soup is an example of a good first meal. Heavy or fried foods are harder to digest and may make you feel nauseous or bloated. Drink plenty of fluids but you should avoid alcoholic beverages for 24 hours. If you had a esophageal dilation, please see attached instructions for diet.    ACTIVITY: Your care partner should take you home directly after the procedure. You should plan to take it easy, moving slowly for the rest of the day. You can resume normal activity the day after the procedure however YOU SHOULD NOT DRIVE, use power tools, machinery or perform tasks that involve climbing or major physical exertion for 24 hours (because of the sedation medicines used during the test).   SYMPTOMS TO REPORT IMMEDIATELY: A gastroenterologist can be reached at any hour. Please call [phone]  for any of the following symptoms:  Following lower endoscopy (colonoscopy, flexible sigmoidoscopy) Excessive amounts of blood in the stool  Significant tenderness, worsening of abdominal pains   Swelling of the abdomen that is new, acute  Fever of 100 or higher    FOLLOW UP:  If any biopsies were taken you will be contacted by phone or by letter within the next 1-3 weeks. Call [phone]  if you have not heard about the biopsies in 3 weeks.  Please also call with any specific questions about appointments or follow up tests.  

## 2016-12-15 NOTE — Progress Notes (Signed)
A and O x3. Report to RN. Tolerated MAC anesthesia well.

## 2016-12-16 ENCOUNTER — Telehealth: Payer: Self-pay | Admitting: *Deleted

## 2016-12-16 ENCOUNTER — Encounter: Payer: Self-pay | Admitting: Pulmonary Disease

## 2016-12-16 ENCOUNTER — Ambulatory Visit (INDEPENDENT_AMBULATORY_CARE_PROVIDER_SITE_OTHER): Payer: 59 | Admitting: Pulmonary Disease

## 2016-12-16 DIAGNOSIS — J9801 Acute bronchospasm: Secondary | ICD-10-CM

## 2016-12-16 DIAGNOSIS — J309 Allergic rhinitis, unspecified: Secondary | ICD-10-CM

## 2016-12-16 NOTE — Assessment & Plan Note (Signed)
I have seen no evidence of airflow obstruction both on physical exam or on lung function testing. Further, her CT scan of the chest showed no evidence of a pulmonary parenchymal lung disease. Overall I see no evidence of chronic lung disease based on the workup we have performed. I have recommended that she follow-up with [place] on an as-needed basis.

## 2016-12-16 NOTE — Progress Notes (Signed)
Subjective:    Patient ID: [name] [name], female    DOB: 28-Apr-1956, 61 y.o.   MRN: [id number]  HPI Chief Complaint  Patient presents with  . Follow-up    Pt states she doing well with no breathing issues. Pt had CT and PFT done since last visit.     Clarene Critchley says that she was told that she had no allergies noted on skin testing up in [place].  She still hsa fullness in her face, some congestion and pressure in her face.  She has seen ENT, but wasn't told that she had any chronic problems. She had some CT scans then.    She reports no dyspnea.  Past Medical History:  Diagnosis Date  . ALLERGIC RHINITIS 04/06/2007  . ANXIETY 04/06/2007  . Arthritis   . CIN I (cervical intraepithelial neoplasia I)   . COLONIC POLYPS 08/04/2007  . Diabetes mellitus without complication ([place])   . DIARRHEA, ACUTE 09/23/2010  . Fibroid   . GASTRIC POLYP 08/04/2007  . GERD 08/04/2007  . GOITER, MULTINODULAR 03/10/2009  . Headache(784.0) 11/13/2008  . Hx of blood clots   . HYPERLIPIDEMIA 04/06/2007  . HYPERTENSION 04/06/2007  . ISCHEMIC COLITIS, HX OF 07/06/2007  . Lichen sclerosus 1700  . Osteopenia 12/2012   T score -1.1 FRAX 5.9%/0.3%  . Palpitations 03/10/2009  . Rosacea 11/30/2007  . THYROID NODULE, LEFT 11/04/2010  . Urinary frequency   . UTI'S, RECURRENT 11/30/2007      Review of Systems     Objective:   Physical Exam  Vitals:   12/16/16 1146  BP: (!) 142/80  Pulse: 69  SpO2: 95%  Weight: 182 lb 3.2 oz (82.6 kg)  Height: 5' 2.4" (1.585 m)   Gen: well appearing HENT: OP clear, TM's clear, neck supple PULM: CTA B, normal percussion CV: RRR, no mgr, trace edema GI: BS+, soft, nontender Derm: no cyanosis or rash Psyche: normal mood and affect   Pulmonary function test: March 2018 pulmonary function test [place] ratio 74%, FEV1 2.04 L 91% predicted, FVC 2.60 L 94% predicted, total lung capacity 4.28 L 95% predicted, DLCO 15.4 71% predicted  Imaging: 2018  high-resolution CT scan of the chest images independently reviewed showing normal pulmonary parenchyma the exception of mild subsegmental atelectasis in the bases right greater than left     Assessment & Plan:  Allergic rhinitis Allergy testing was normal which is surprising. Because of her ongoing sinus symptoms have recommended that she follow-up with ENT again.  Bronchospasm I have seen no evidence of airflow obstruction both on physical exam or on lung function testing. Further, her CT scan of the chest showed no evidence of a pulmonary parenchymal lung disease. Overall I see no evidence of chronic lung disease based on the workup we have performed. I have recommended that she follow-up with [place] on an as-needed basis.    Current Outpatient Prescriptions:  .  aspirin 81 MG EC tablet, Take 81 mg by mouth daily.  , Disp: , Rfl:  .  Blood Glucose Monitoring Suppl (ONE TOUCH ULTRA 2) W/DEVICE KIT, Use as directed 1 per day, Disp: 1 each, Rfl: 0 .  cetirizine (ZYRTEC) 10 MG tablet, Take 10 mg by mouth daily.  , Disp: , Rfl:  .  dextromethorphan-guaiFENesin (MUCINEX DM) 30-600 MG per 12 hr tablet, Take 1 tablet by mouth 2 (two) times daily., Disp: , Rfl:  .  Esomeprazole Magnesium (NEXIUM 24HR PO), Take by mouth daily., Disp: , Rfl:  .  fluticasone (FLONASE) 50 MCG/ACT nasal spray, Place 1 spray into both nostrils daily., Disp: 48 g, Rfl: 1 .  glucose blood test strip, Use as instructed, Disp: 100 each, Rfl: 12 .  Lancets MISC, Use as directed 1 per day, Disp: 100 each, Rfl: 12 .  montelukast (SINGULAIR) 10 MG tablet, Take 1 tablet (10 mg total) by mouth at bedtime., Disp: 90 tablet, Rfl: 3 .  Multiple Vitamins-Minerals (MULTIVITAMIN PO), Take by mouth daily., Disp: , Rfl:  .  potassium chloride SA (K-DUR,KLOR-CON) 20 MEQ tablet, TAKE 1 TABLET BY MOUTH TWO  TIMES DAILY, Disp: 180 tablet, Rfl: 2 .  rosuvastatin (CRESTOR) 20 MG tablet, TAKE 1 TABLET BY MOUTH  DAILY, Disp: 90 tablet, Rfl: 2 .   valsartan-hydrochlorothiazide (DIOVAN-HCT) 320-25 MG tablet, TAKE 1 TABLET BY MOUTH  DAILY, Disp: 90 tablet, Rfl: 2 .  clobetasol cream (TEMOVATE) 0.05 %, Apply nightly as needed for irritation (Patient not taking: Reported on 12/16/2016), Disp: 30 g, Rfl: 2 .  nitrofurantoin (MACRODANTIN) 50 MG capsule, Take 1 capsule (50 mg total) by mouth at bedtime. (Patient not taking: Reported on 12/16/2016), Disp: 90 capsule, Rfl: 3  Current Facility-Administered Medications:  .  0.9 %  sodium chloride infusion, 500 mL, Intravenous, Continuous, Irene Shipper, MD

## 2016-12-16 NOTE — Patient Instructions (Signed)
Your lung function testing and CT scan of the chest were normal If you continue to have sinus symptoms he may want to follow-up with Dr. Lucia Gaskins again Come back to see [place] if you develop a respiratory problem.

## 2016-12-16 NOTE — Telephone Encounter (Signed)
  Follow up Call-  Call back number 12/15/2016  Post procedure Call Back phone  # [phone]  Permission to leave phone message Yes  Some recent data might be hidden     Patient questions:  Do you have a fever, pain , or abdominal swelling? No. Pain Score  0 *  Have you tolerated food without any problems? Yes.    Have you been able to return to your normal activities? Yes.    Do you have any questions about your discharge instructions: Diet   No. Medications  No. Follow up visit  No.  Do you have questions or concerns about your Care? No.  Actions: * If pain score is 4 or above: No action needed, pain <4.

## 2016-12-16 NOTE — Assessment & Plan Note (Signed)
Allergy testing was normal which is surprising. Because of her ongoing sinus symptoms have recommended that she follow-up with ENT again.

## 2016-12-22 ENCOUNTER — Encounter: Payer: Self-pay | Admitting: Internal Medicine

## 2016-12-25 IMAGING — US US THYROID BIOPSY
1 series · 13 of 14 positions shown · non-contrast
Comparison: US Soft tissue Head/Neck 09/16/15

MEDICATIONS:
5 cc 1% lidocaine

COMPLICATIONS:
None immediate

INDICATION: Indeterminate thyroid nodule

Left thyroid nodule 2.4 cm
EXAM:
ULTRASOUND GUIDED FINE NEEDLE ASPIRATION OF INDETERMINATE THYROID
NODULE
TECHNIQUE: Informed written consent was obtained from the patient after a
discussion of the risks, benefits and alternatives to treatment.
Questions regarding the procedure were encouraged and answered. A
timeout was performed prior to the initiation of the procedure.

[Series 1: us thyroid biopsy · 0.06mm/px · 14 acquisitions, 13 frames shown]
[im 1/14]
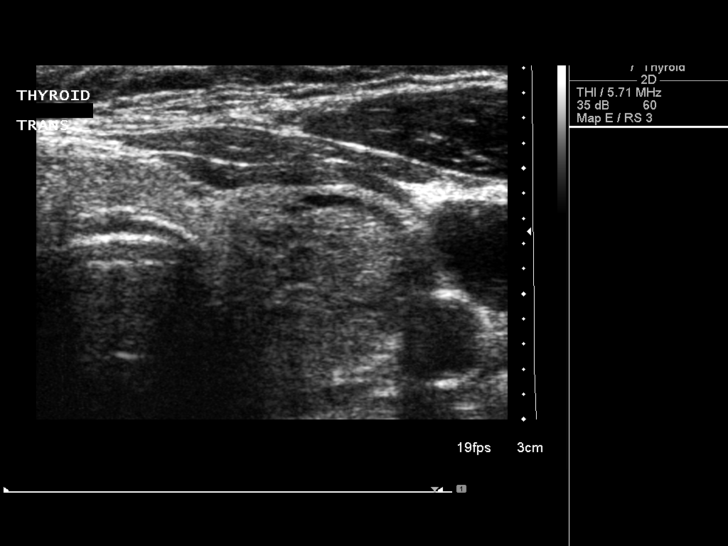
[im 2/14]
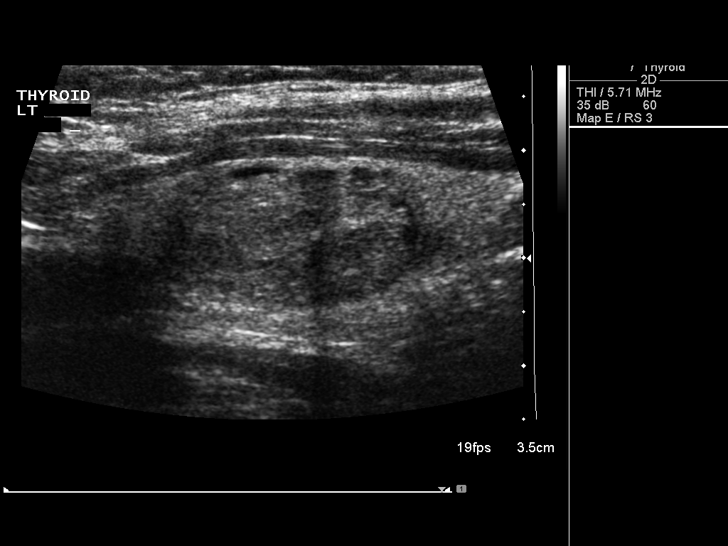
[im 3/14]
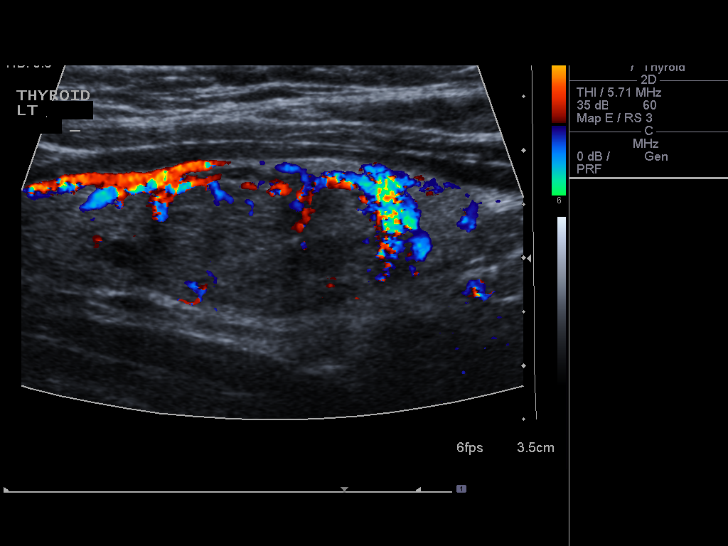
[im 4/14]
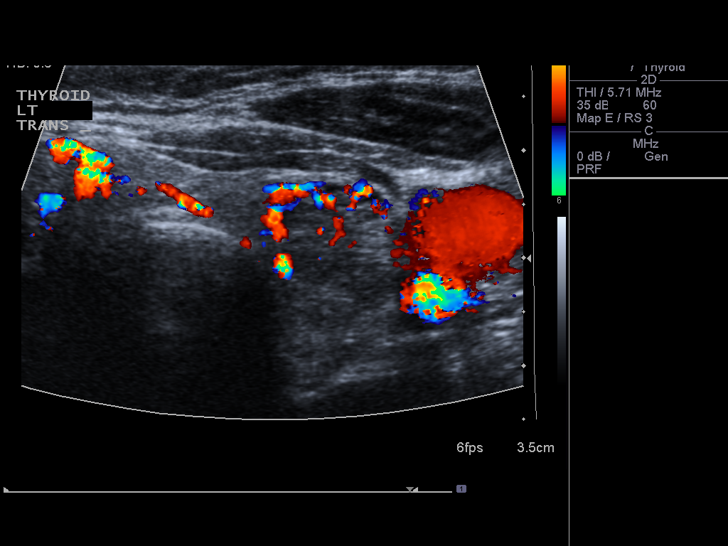
[im 5/14]
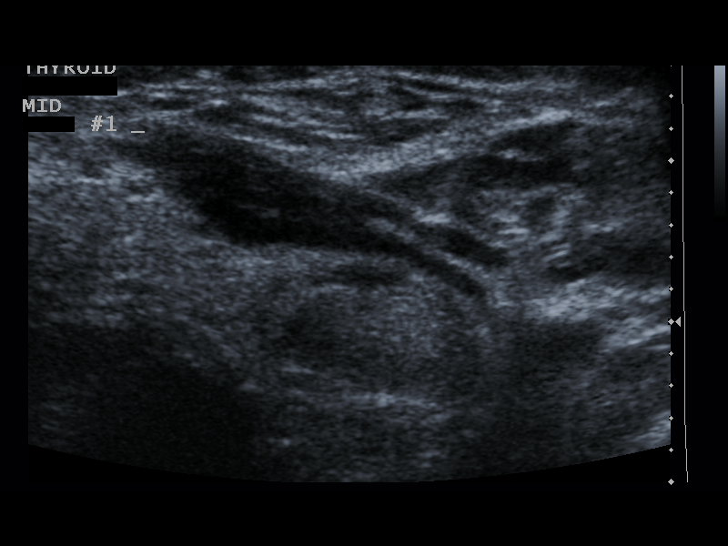
[im 6/14]
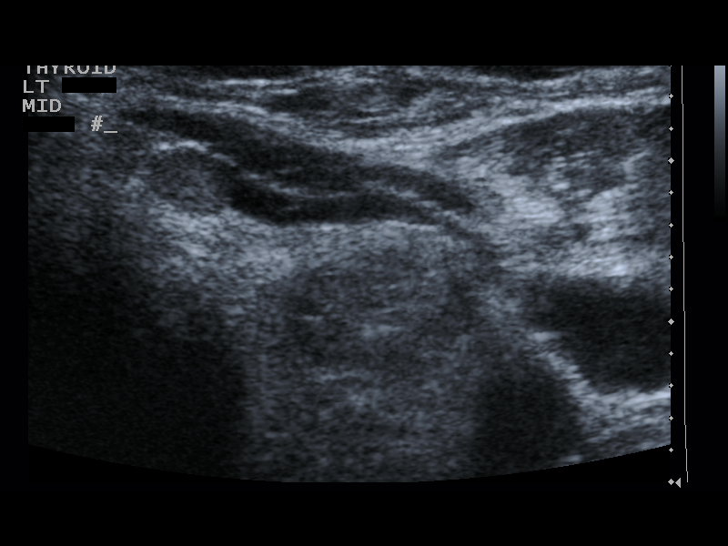
[im 8/14]
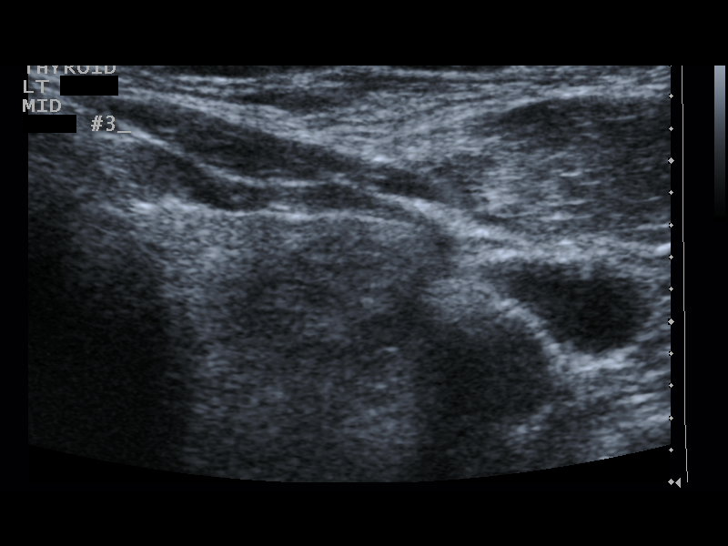
[im 9/14]
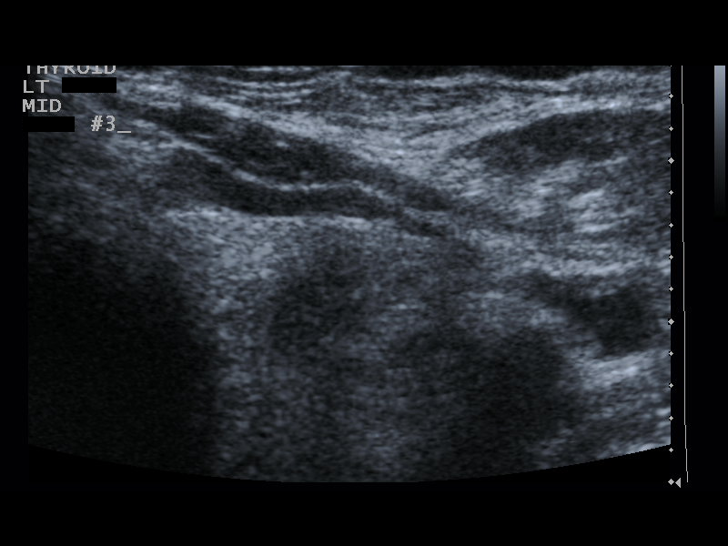
[im 10/14]
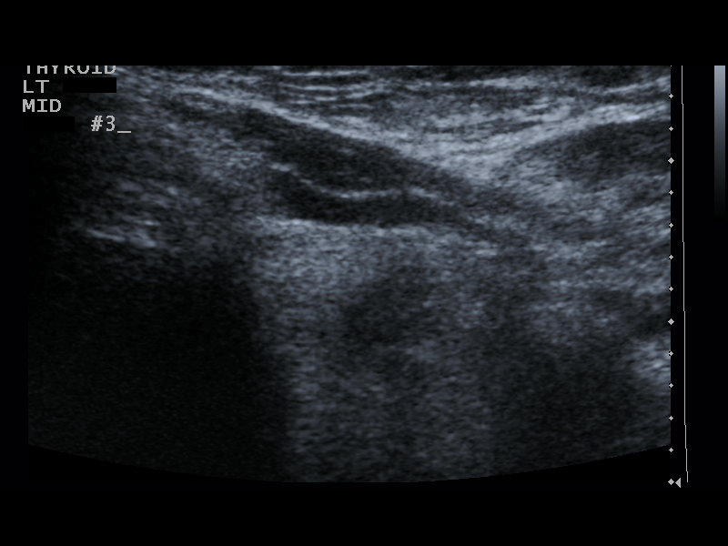
[im 11/14]
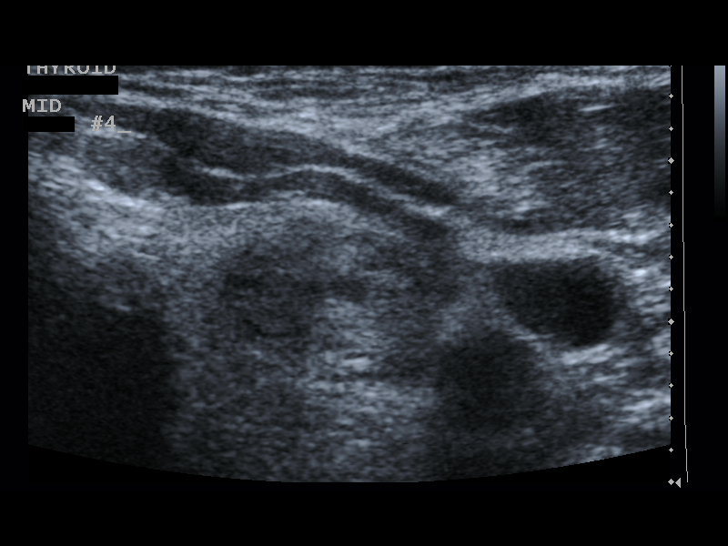
[im 12/14]
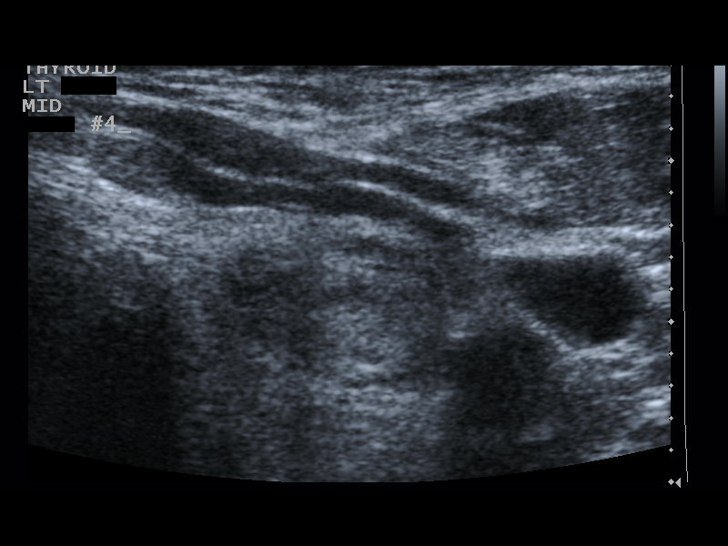
[im 13/14]
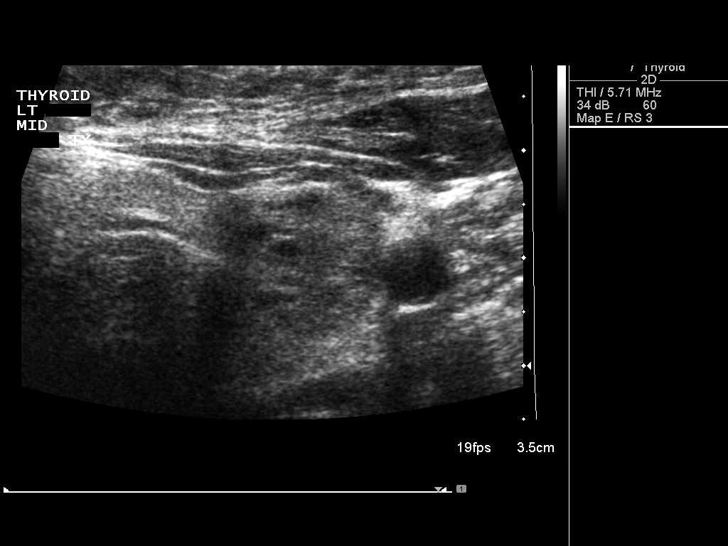
[im 14/14]
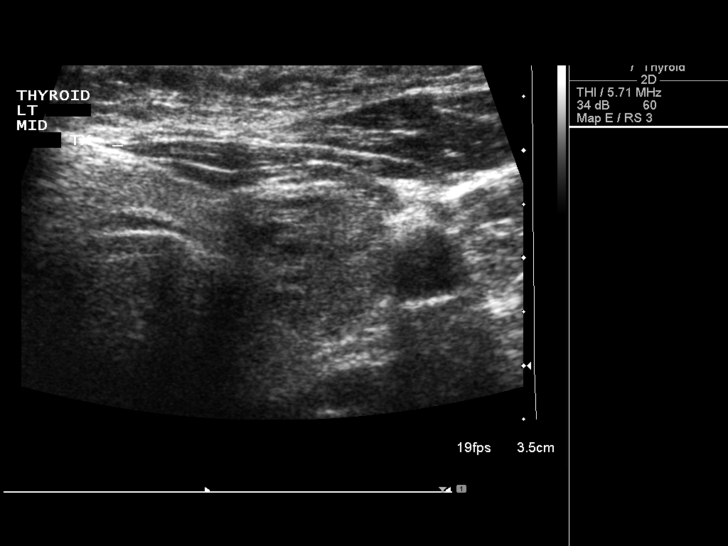

[13 of 14 positions shown; findings below may reference images not displayed]

Pre-procedural ultrasound scanning demonstrated left thyroid nodule

The procedure was planned. The neck was prepped in the usual sterile
fashion, and a sterile drape was applied covering the operative
field. A timeout was performed prior to the initiation of the
procedure. Local anesthesia was provided with 1% lidocaine.

Under direct ultrasound guidance, 4 FNA biopsies were performed of
the left thyroid nodule with a 25 gauge needle. The samples were
prepared and submitted to pathology.

Limited post procedural scanning was negative for hematoma or
additional complication. Dressings were placed. The patient
tolerated the above procedures procedure well without immediate
postprocedural complication.
IMPRESSION: Technically successful ultrasound guided fine needle aspiration of
Left thyroid nodule

Read by:  Shanette Torgerson

## 2017-01-14 ENCOUNTER — Other Ambulatory Visit: Payer: Self-pay | Admitting: Internal Medicine

## 2017-01-31 ENCOUNTER — Encounter: Payer: Self-pay | Admitting: Gynecology

## 2017-01-31 ENCOUNTER — Ambulatory Visit (INDEPENDENT_AMBULATORY_CARE_PROVIDER_SITE_OTHER): Payer: 59 | Admitting: Gynecology

## 2017-01-31 VITALS — BP 124/72 | Ht 61.5 in | Wt 182.0 lb

## 2017-01-31 DIAGNOSIS — M8580 Other specified disorders of bone density and structure, unspecified site: Secondary | ICD-10-CM | POA: Diagnosis not present

## 2017-01-31 DIAGNOSIS — Z01411 Encounter for gynecological examination (general) (routine) with abnormal findings: Secondary | ICD-10-CM | POA: Diagnosis not present

## 2017-01-31 DIAGNOSIS — N952 Postmenopausal atrophic vaginitis: Secondary | ICD-10-CM | POA: Diagnosis not present

## 2017-01-31 NOTE — Patient Instructions (Signed)
Follow up for annual exam in one year 

## 2017-01-31 NOTE — Progress Notes (Signed)
    [name] [name] 1956/02/14 [id number]        61 y.o.  G0P0 for annual exam.    Past medical history,surgical history, problem list, medications, allergies, family history and social history were all reviewed and documented as reviewed in the EPIC chart.  ROS:  Performed with pertinent positives and negatives included in the history, assessment and plan.   Additional significant findings :  None   Exam: Caryn Bee assistant Vitals:   01/31/17 1531  BP: 124/72  Weight: 182 lb (82.6 kg)  Height: 5' 1.5" (1.562 m)   Body mass index is 33.83 kg/m.  General appearance:  Normal affect, orientation and appearance. Skin: Grossly normal. Extremities shows 1+ pitting pedal edema HEENT: Without gross lesions.  No cervical or supraclavicular adenopathy. Thyroid normal.  Lungs:  Clear without wheezing, rales or rhonchi Cardiac: RR, without RMG Abdominal:  Soft, nontender, without masses, guarding, rebound, organomegaly or hernia Breasts:  Examined lying and sitting without masses, retractions, discharge or axillary adenopathy. Pelvic:  Ext, BUS, Vagina: With atrophic changes  Cervix: With atrophic changes  Uterus: Anteverted, normal size, shape and contour, midline and mobile nontender   Adnexa: Without masses or tenderness    Anus and perineum: Normal   Rectovaginal: Normal sphincter tone without palpated masses or tenderness.    Assessment/Plan:  61 y.o. G0P0 female for annual exam.   1. Postmenopausal/atrophic genital changes. No significant hot flushes, night sweats, vaginal dryness or any bleeding. Continue to monitor report any issues or bleeding. 2. History of pruritic area on the perineal body biopsy showing lichen simplex chronicus. Used Temovate 0.05% cream intermittently in the past but has not used it for some time as she is not bothered by the itching. Exam does not show significant irritation today. Will monitor and use the cream as needed. 3. Mammography 08/2016.  Continue with annual mammography when due. SBE monthly reviewed. 4. Colonoscopy 2018. 5. Osteopenia. DEXA 2014 T score -1.1 FRAX 6%/0.3%. Repeat now versus waiting another year so discussed and patient prefers to wait. Increase calcium vitamin D. 6. Pap smear/HPV 08/2014. No Pap smear done today. History of CIN-1 1994 with normal Pap smears afterwards. Plan repeat Pap smear at 5 year interval per current screening guidelines. 7. Health maintenance. Patient notes some pedal edema of the last several months. Does have a history of hypertension on diuretic. Mild pitting pedal edema bilaterally noted. Recommended she follow up with her primary for evaluation. Various possibilities to include venous insufficiency, cardiac and renal. No routine blood work done as this is done at her primary physician's office. Follow up 1 year, sooner as needed.   Anastasio Auerbach MD, 3:55 PM 01/31/2017

## 2017-03-18 ENCOUNTER — Ambulatory Visit: Payer: 59 | Admitting: Internal Medicine

## 2017-03-24 ENCOUNTER — Other Ambulatory Visit (INDEPENDENT_AMBULATORY_CARE_PROVIDER_SITE_OTHER): Payer: 59

## 2017-03-24 DIAGNOSIS — E119 Type 2 diabetes mellitus without complications: Secondary | ICD-10-CM | POA: Diagnosis not present

## 2017-03-24 LAB — LIPID PANEL
CHOLESTEROL: 150 mg/dL (ref 0–200)
HDL: 40.5 mg/dL (ref >39.00)
NonHDL: 109.14
TRIGLYCERIDES: 219 mg/dL — AB (ref 0.0–149.0)
Total CHOL/HDL Ratio: 4
VLDL: 43.8 mg/dL — ABNORMAL HIGH (ref 0.0–40.0)

## 2017-03-24 LAB — BASIC METABOLIC PANEL
BUN: 12 mg/dL (ref 6–23)
CALCIUM: 9.5 mg/dL (ref 8.4–10.5)
CO2: 27 mEq/L (ref 19–32)
CREATININE: 0.88 mg/dL (ref 0.40–1.20)
Chloride: 104 mEq/L (ref 96–112)
GFR: 69.41 mL/min (ref >60.00)
GLUCOSE: 144 mg/dL — AB (ref 70–99)
Potassium: 3.8 mEq/L (ref 3.5–5.1)
Sodium: 141 mEq/L (ref 135–145)

## 2017-03-24 LAB — HEPATIC FUNCTION PANEL
ALBUMIN: 4.4 g/dL (ref 3.5–5.2)
ALT: 33 U/L (ref 0–35)
AST: 33 U/L (ref 0–37)
Alkaline Phosphatase: 93 U/L (ref 39–117)
Bilirubin, Direct: 0.1 mg/dL (ref 0.0–0.3)
TOTAL PROTEIN: 7.3 g/dL (ref 6.0–8.3)
Total Bilirubin: 0.4 mg/dL (ref 0.2–1.2)

## 2017-03-24 LAB — HEMOGLOBIN A1C: Hgb A1c MFr Bld: 7.6 % — ABNORMAL HIGH (ref 4.6–6.5)

## 2017-03-24 LAB — LDL CHOLESTEROL, DIRECT: LDL DIRECT: 83 mg/dL

## 2017-03-31 ENCOUNTER — Encounter: Payer: Self-pay | Admitting: Internal Medicine

## 2017-03-31 ENCOUNTER — Ambulatory Visit (INDEPENDENT_AMBULATORY_CARE_PROVIDER_SITE_OTHER): Payer: 59 | Admitting: Internal Medicine

## 2017-03-31 ENCOUNTER — Other Ambulatory Visit (INDEPENDENT_AMBULATORY_CARE_PROVIDER_SITE_OTHER): Payer: 59

## 2017-03-31 ENCOUNTER — Telehealth: Payer: Self-pay

## 2017-03-31 VITALS — BP 136/78 | HR 82 | Ht 61.5 in | Wt 180.0 lb

## 2017-03-31 DIAGNOSIS — E119 Type 2 diabetes mellitus without complications: Secondary | ICD-10-CM

## 2017-03-31 DIAGNOSIS — R609 Edema, unspecified: Secondary | ICD-10-CM

## 2017-03-31 DIAGNOSIS — Z0000 Encounter for general adult medical examination without abnormal findings: Secondary | ICD-10-CM | POA: Diagnosis not present

## 2017-03-31 DIAGNOSIS — I10 Essential (primary) hypertension: Secondary | ICD-10-CM

## 2017-03-31 DIAGNOSIS — R600 Localized edema: Secondary | ICD-10-CM

## 2017-03-31 LAB — BRAIN NATRIURETIC PEPTIDE: PRO B NATRI PEPTIDE: 5 pg/mL (ref 0.0–100.0)

## 2017-03-31 NOTE — Assessment & Plan Note (Signed)
Venous insuff vs other? , for BNP, echo, hold on diuretic for now, for compression stockings

## 2017-03-31 NOTE — Patient Instructions (Signed)
Please continue all other medications as before, and refills have been done if requested.  Please have the pharmacy call with any other refills you may need.  Please continue your efforts at being more active, low cholesterol diet, and weight control.  You are otherwise up to date with prevention measures today.  Please keep your appointments with your specialists as you may have planned  You will be contacted regarding the referral for: echocardiogram  Please go to the LAB in the Basement (turn left off the elevator) for the tests to be done today - just the "BNP" today  You will be contacted by phone if any changes need to be made immediately.  Otherwise, you will receive a letter about your results with an explanation, but please check with MyChart first.  Please remember to sign up for MyChart if you have not done so, as this will be important to you in the future with finding out test results, communicating by private email, and scheduling acute appointments online when needed.  Please return in 6 months, or sooner if needed, with Lab testing done 3-5 days before

## 2017-03-31 NOTE — Progress Notes (Signed)
Subjective:    Patient ID: [name] [name], female    DOB: 1955/12/20, 61 y.o.   MRN: [id number]  HPI  Here to f/u; overall doing ok,  Pt denies chest pain, increasing sob or doe, wheezing, orthopnea, PND, increased LE swelling, palpitations, dizziness or syncope.  Pt denies new neurological symptoms such as new headache, or facial or extremity weakness or numbness.  Pt denies polydipsia, polyuria, or low sugar episode.  Pt states overall good compliance with meds, mostly trying to follow appropriate diet, with wt overall stable,  but little exercise however. Lost wt but a1c higher due to eating out/diet noncompliance and less active.  Plans to get back to walking Wt Readings from Last 3 Encounters:  03/31/17 180 lb (81.6 kg)  01/31/17 182 lb (82.6 kg)  12/16/16 182 lb 3.2 oz (82.6 kg)   Past Medical History:  Diagnosis Date  . ALLERGIC RHINITIS 04/06/2007  . ANXIETY 04/06/2007  . Arthritis   . CIN I (cervical intraepithelial neoplasia I)   . COLONIC POLYPS 08/04/2007  . Diabetes mellitus without complication ([place])   . DIARRHEA, ACUTE 09/23/2010  . Fibroid   . GASTRIC POLYP 08/04/2007  . GERD 08/04/2007  . GOITER, MULTINODULAR 03/10/2009  . Headache(784.0) 11/13/2008  . Hx of blood clots   . HYPERLIPIDEMIA 04/06/2007  . HYPERTENSION 04/06/2007  . ISCHEMIC COLITIS, HX OF 07/06/2007  . Lichen sclerosus 5852  . Osteopenia 12/2012   T score -1.1 FRAX 5.9%/0.3%  . Palpitations 03/10/2009  . Rosacea 11/30/2007  . THYROID NODULE, LEFT 11/04/2010  . Urinary frequency   . UTI'S, RECURRENT 11/30/2007   Past Surgical History:  Procedure Laterality Date  . APPENDECTOMY  2005  . bladder tack    . COLPOSCOPY    . CYSTOSCOPY  2007  . DERMOID CYST  EXCISION  1994   right ovary  . FLEXIBLE SIGMOIDOSCOPY  11/16/95  . Lt. pectoral/subclavicular abscess drainage  2006  . removed skull cyst  1973  . right foot neuroma  1988  . uterine fibroids removed  1996    reports that she has never smoked.  She has never used smokeless tobacco. She reports that she drinks alcohol. She reports that she does not use drugs. family history includes Breast cancer in her paternal aunt; Deep vein thrombosis in her brother; Diabetes in her mother; Emphysema in her father; Heart disease in her father and mother; Hypertension in her mother; Lung cancer in her maternal grandmother, maternal uncle, and mother; Stroke in her brother and father. Allergies  Allergen Reactions  . Atorvastatin     REACTION: extreme muscle aches  . Cefuroxime Axetil     REACTION: DIARRHEA  . Ciprofloxacin     REACTION: diarrhea  . Latex     REACTION: rash  . Lipitor [Atorvastatin Calcium] Other (See Comments)    sensitive  . Moxifloxacin     REACTION: DIARRHEA   Current Outpatient Prescriptions on File Prior to Visit  Medication Sig Dispense Refill  . aspirin 81 MG EC tablet Take 81 mg by mouth daily.      . Blood Glucose Monitoring Suppl (ONE TOUCH ULTRA 2) W/DEVICE KIT Use as directed 1 per day 1 each 0  . dextromethorphan-guaiFENesin (MUCINEX DM) 30-600 MG per 12 hr tablet Take 1 tablet by mouth 2 (two) times daily.    . Esomeprazole Magnesium (NEXIUM 24HR PO) Take by mouth daily.    . fluticasone (FLONASE) 50 MCG/ACT nasal spray Place 1 spray into both nostrils  daily. 48 g 1  . glucose blood test strip Use as instructed 100 each 12  . Lancets MISC Use as directed 1 per day 100 each 12  . Multiple Vitamins-Minerals (MULTIVITAMIN PO) Take by mouth daily.    . potassium chloride SA (K-DUR,KLOR-CON) 20 MEQ tablet TAKE 1 TABLET BY MOUTH TWO  TIMES DAILY 180 tablet 2  . rosuvastatin (CRESTOR) 20 MG tablet TAKE 1 TABLET BY MOUTH  DAILY 90 tablet 2  . valsartan-hydrochlorothiazide (DIOVAN-HCT) 320-25 MG tablet TAKE 1 TABLET BY MOUTH  DAILY 90 tablet 2   Current Facility-Administered Medications on File Prior to Visit  Medication Dose Route Frequency Provider Last Rate Last Dose  . 0.9 %  sodium chloride infusion  500 mL  Intravenous Continuous Irene Shipper, MD       Review of Systems  Constitutional: Negative for other unusual diaphoresis or sweats HENT: Negative for ear discharge or swelling Eyes: Negative for other worsening visual disturbances Respiratory: Negative for stridor or other swelling  Gastrointestinal: Negative for worsening distension or other blood Genitourinary: Negative for retention or other urinary change Musculoskeletal: Negative for other MSK pain or swelling Skin: Negative for color change or other new lesions Neurological: Negative for worsening tremors and other numbness  Psychiatric/Behavioral: Negative for worsening agitation or other fatigue All other system neg per pt    Objective:   Physical Exam BP 136/78   Pulse 82   Ht 5' 1.5" (1.562 m)   Wt 180 lb (81.6 kg)   SpO2 97%   BMI 33.46 kg/m  VS noted,  Constitutional: Pt appears in NAD HENT: Head: NCAT.  Right Ear: External ear normal.  Left Ear: External ear normal.  Eyes: . Pupils are equal, round, and reactive to light. Conjunctivae and EOM are normal Nose: without d/c or deformity Neck: Neck supple. Gross normal ROM Cardiovascular: Normal rate and regular rhythm.   Pulmonary/Chest: Effort normal and breath sounds without rales or wheezing.  Neurological: Pt is alert. At baseline orientation, motor grossly intact Skin: Skin is warm. No rashes, other new lesions, 1+ bilat LE edema to above ankles Psychiatric: Pt behavior is normal without agitation  No other exam findings    Assessment & Plan:

## 2017-03-31 NOTE — Telephone Encounter (Signed)
Pt was informed and expressed understanding.    --Cecille Rubin: Please cancel referral. Thanks!

## 2017-03-31 NOTE — Telephone Encounter (Signed)
-----   Message from Biagio Borg, MD sent at 03/31/2017  6:03 PM EDT ----- Left message on MyChart, pt to cont same tx except  The test results show that your current treatment is OK, as the BNP is VERY low, meaning that the chance of congestive heart failure is quite low.  I would understand if you do not want the echocardiogram that was ordered today, as it really may not be necessary.  Shirron to please inform pt

## 2017-04-02 NOTE — Assessment & Plan Note (Signed)
stable overall by history and exam, recent data reviewed with pt, and pt to continue medical treatment as before,  to f/u any worsening symptoms or concerns Lab Results  Component Value Date   HGBA1C 7.6 (H) 03/24/2017

## 2017-04-02 NOTE — Assessment & Plan Note (Signed)
stable overall by history and exam, recent data reviewed with pt, and pt to continue medical treatment as before,  to f/u any worsening symptoms or concerns BP Readings from Last 3 Encounters:  03/31/17 136/78  01/31/17 124/72  12/16/16 (!) 142/80

## 2017-04-05 ENCOUNTER — Other Ambulatory Visit: Payer: 59

## 2017-05-17 ENCOUNTER — Encounter: Payer: Self-pay | Admitting: Internal Medicine

## 2017-05-17 ENCOUNTER — Ambulatory Visit (INDEPENDENT_AMBULATORY_CARE_PROVIDER_SITE_OTHER): Payer: 59 | Admitting: Internal Medicine

## 2017-05-17 VITALS — BP 136/74 | HR 79 | Temp 98.3°F | Ht 61.5 in | Wt 173.0 lb

## 2017-05-17 DIAGNOSIS — Z0000 Encounter for general adult medical examination without abnormal findings: Secondary | ICD-10-CM | POA: Diagnosis not present

## 2017-05-17 DIAGNOSIS — I10 Essential (primary) hypertension: Secondary | ICD-10-CM

## 2017-05-17 DIAGNOSIS — Z114 Encounter for screening for human immunodeficiency virus [HIV]: Secondary | ICD-10-CM

## 2017-05-17 DIAGNOSIS — E119 Type 2 diabetes mellitus without complications: Secondary | ICD-10-CM | POA: Diagnosis not present

## 2017-05-17 LAB — POCT GLYCOSYLATED HEMOGLOBIN (HGB A1C): Hemoglobin A1C: 6.7

## 2017-05-17 NOTE — Patient Instructions (Addendum)
Your A1c today was:  6.7  Please continue all other medications as before, and refills have been done if requested.  Please have the pharmacy call with any other refills you may need.  Please continue your efforts at being more active, low cholesterol diet, and weight control.  You are otherwise up to date with prevention measures today.  Please keep your appointments with your specialists as you may have planned  Your form was filled out today  Please return in Jan 16, or sooner if needed, with Lab testing done 3-5 days before

## 2017-05-17 NOTE — Progress Notes (Signed)
Subjective:    Patient ID: [name] [name], female    DOB: 11-14-1955, 61 y.o.   MRN: [id number]  HPI  Here for wellness and f/u;  Overall doing ok;  Pt denies Chest pain, worsening SOB, DOE, wheezing, orthopnea, PND, worsening LE edema, palpitations, dizziness or syncope.  Pt denies neurological change such as new headache, facial or extremity weakness.  Pt denies polydipsia, polyuria, or low sugar symptoms. Pt states overall good compliance with treatment and medications, good tolerability, and has been trying to follow appropriate diet.  Pt denies worsening depressive symptoms, suicidal ideation or panic. No fever, night sweats, wt loss, loss of appetite, or other constitutional symptoms.  Pt states good ability with ADL's, has low fall risk, home safety reviewed and adequate, no other significant changes in hearing or vision, and occasionally active with exercise. Lost another 7 lbs with better diet and exercise.  Wt Readings from Last 3 Encounters:  05/17/17 173 lb (78.5 kg)  03/31/17 180 lb (81.6 kg)  01/31/17 182 lb (82.6 kg)  .declines flu shot Past Medical History:  Diagnosis Date  . ALLERGIC RHINITIS 04/06/2007  . ANXIETY 04/06/2007  . Arthritis   . CIN I (cervical intraepithelial neoplasia I)   . COLONIC POLYPS 08/04/2007  . Diabetes mellitus without complication ([place])   . DIARRHEA, ACUTE 09/23/2010  . Fibroid   . GASTRIC POLYP 08/04/2007  . GERD 08/04/2007  . GOITER, MULTINODULAR 03/10/2009  . Headache(784.0) 11/13/2008  . Hx of blood clots   . HYPERLIPIDEMIA 04/06/2007  . HYPERTENSION 04/06/2007  . ISCHEMIC COLITIS, HX OF 07/06/2007  . Lichen sclerosus 4034  . Osteopenia 12/2012   T score -1.1 FRAX 5.9%/0.3%  . Palpitations 03/10/2009  . Rosacea 11/30/2007  . THYROID NODULE, LEFT 11/04/2010  . Urinary frequency   . UTI'S, RECURRENT 11/30/2007   Past Surgical History:  Procedure Laterality Date  . APPENDECTOMY  2005  . bladder tack    . COLPOSCOPY    . CYSTOSCOPY  2007  .  DERMOID CYST  EXCISION  1994   right ovary  . FLEXIBLE SIGMOIDOSCOPY  11/16/95  . Lt. pectoral/subclavicular abscess drainage  2006  . removed skull cyst  1973  . right foot neuroma  1988  . uterine fibroids removed  1996    reports that she has never smoked. She has never used smokeless tobacco. She reports that she drinks alcohol. She reports that she does not use drugs. family history includes Breast cancer in her paternal aunt; Deep vein thrombosis in her brother; Diabetes in her mother; Emphysema in her father; Heart disease in her father and mother; Hypertension in her mother; Lung cancer in her maternal grandmother, maternal uncle, and mother; Stroke in her brother and father. Allergies  Allergen Reactions  . Atorvastatin     REACTION: extreme muscle aches  . Cefuroxime Axetil     REACTION: DIARRHEA  . Ciprofloxacin     REACTION: diarrhea  . Latex     REACTION: rash  . Lipitor [Atorvastatin Calcium] Other (See Comments)    sensitive  . Moxifloxacin     REACTION: DIARRHEA   Current Outpatient Prescriptions on File Prior to Visit  Medication Sig Dispense Refill  . aspirin 81 MG EC tablet Take 81 mg by mouth daily.      . Blood Glucose Monitoring Suppl (ONE TOUCH ULTRA 2) W/DEVICE KIT Use as directed 1 per day 1 each 0  . dextromethorphan-guaiFENesin (MUCINEX DM) 30-600 MG per 12 hr tablet Take 1  tablet by mouth 2 (two) times daily.    . Esomeprazole Magnesium (NEXIUM 24HR PO) Take by mouth daily.    . fluticasone (FLONASE) 50 MCG/ACT nasal spray Place 1 spray into both nostrils daily. 48 g 1  . glucose blood test strip Use as instructed 100 each 12  . Lancets MISC Use as directed 1 per day 100 each 12  . levocetirizine (XYZAL) 5 MG tablet Take 5 mg by mouth every evening.    . Multiple Vitamins-Minerals (MULTIVITAMIN PO) Take by mouth daily.    . potassium chloride SA (K-DUR,KLOR-CON) 20 MEQ tablet TAKE 1 TABLET BY MOUTH TWO  TIMES DAILY 180 tablet 2  . rosuvastatin (CRESTOR)  20 MG tablet TAKE 1 TABLET BY MOUTH  DAILY 90 tablet 2  . trimethoprim (TRIMPEX) 100 MG tablet Take 100 mg by mouth 2 (two) times daily.    . valsartan-hydrochlorothiazide (DIOVAN-HCT) 320-25 MG tablet TAKE 1 TABLET BY MOUTH  DAILY 90 tablet 2   No current facility-administered medications on file prior to visit.    Review of Systems Constitutional: Negative for other unusual diaphoresis, sweats, appetite or weight changes HENT: Negative for other worsening hearing loss, ear pain, facial swelling, mouth sores or neck stiffness.   Eyes: Negative for other worsening pain, redness or other visual disturbance.  Respiratory: Negative for other stridor or swelling Cardiovascular: Negative for other palpitations or other chest pain  Gastrointestinal: Negative for worsening diarrhea or loose stools, blood in stool, distention or other pain Genitourinary: Negative for hematuria, flank pain or other change in urine volume.  Musculoskeletal: Negative for myalgias or other joint swelling.  Skin: Negative for other color change, or other wound or worsening drainage.  Neurological: Negative for other syncope or numbness. Hematological: Negative for other adenopathy or swelling Psychiatric/Behavioral: Negative for hallucinations, other worsening agitation, SI, self-injury, or new decreased concentration No other system neg per pt    Objective:   Physical Exam BP 136/74   Pulse 79   Temp 98.3 F (36.8 C) (Oral)   Ht 5' 1.5" (1.562 m)   Wt 173 lb (78.5 kg)   SpO2 97%   BMI 32.16 kg/m  VS noted,  Constitutional: Pt is oriented to person, place, and time. Appears well-developed and well-nourished, in no significant distress and comfortable Head: Normocephalic and atraumatic  Eyes: Conjunctivae and EOM are normal. Pupils are equal, round, and reactive to light Right Ear: External ear normal without discharge Left Ear: External ear normal without discharge Nose: Nose without discharge or  deformity Mouth/Throat: Oropharynx is without other ulcerations and moist  Neck: Normal range of motion. Neck supple. No JVD present. No tracheal deviation present or significant neck LA or mass Cardiovascular: Normal rate, regular rhythm, normal heart sounds and intact distal pulses.   Pulmonary/Chest: WOB normal and breath sounds without rales or wheezing  Abdominal: Soft. Bowel sounds are normal. NT. No HSM  Musculoskeletal: Normal range of motion. Exhibits no edema Lymphadenopathy: Has no other cervical adenopathy.  Neurological: Pt is alert and oriented to person, place, and time. Pt has normal reflexes. No cranial nerve deficit. Motor grossly intact, Gait intact Skin: Skin is warm and dry. No rash noted or new ulcerations Psychiatric:  Has normal mood and affect. Behavior is normal without agitation No other exam findings  POCT A1c today -  POCT glycosylated hemoglobin (Hb A1C)  Order: [id number]  Status:  Final result  Visible to patient:  No (Not Released)  Dx:  Type 2 diabetes mellitus without comp.Marland KitchenMarland Kitchen  Component 15:59  Hemoglobin A1C 6.7           Assessment & Plan:

## 2017-05-19 NOTE — Assessment & Plan Note (Signed)
stable overall by history and exam, recent data reviewed with pt, and pt to continue medical treatment as before,  to f/u any worsening symptoms or concerns BP Readings from Last 3 Encounters:  05/17/17 136/74  03/31/17 136/78  01/31/17 124/72

## 2017-05-19 NOTE — Assessment & Plan Note (Signed)

## 2017-05-19 NOTE — Assessment & Plan Note (Signed)
stable overall by history and exam, recent data reviewed with pt, and pt to continue medical treatment as before,  to f/u any worsening symptoms or concerns Lab Results  Component Value Date   HGBA1C 6.7 05/17/2017

## 2017-06-09 DIAGNOSIS — H1045 Other chronic allergic conjunctivitis: Secondary | ICD-10-CM | POA: Diagnosis not present

## 2017-06-09 DIAGNOSIS — J300 Vasomotor rhinitis: Secondary | ICD-10-CM | POA: Diagnosis not present

## 2017-06-29 ENCOUNTER — Other Ambulatory Visit: Payer: Self-pay | Admitting: Internal Medicine

## 2017-07-04 ENCOUNTER — Telehealth: Payer: Self-pay | Admitting: Internal Medicine

## 2017-07-04 NOTE — Telephone Encounter (Signed)
Patient would like charted that she got her flu shot at work on 07/04/2017.

## 2017-07-04 NOTE — Telephone Encounter (Signed)
Entered in epic...[name] [name]

## 2017-07-11 ENCOUNTER — Other Ambulatory Visit: Payer: Self-pay | Admitting: Internal Medicine

## 2017-07-11 DIAGNOSIS — Z1231 Encounter for screening mammogram for malignant neoplasm of breast: Secondary | ICD-10-CM

## 2017-07-28 ENCOUNTER — Other Ambulatory Visit: Payer: Self-pay | Admitting: Surgery

## 2017-07-28 DIAGNOSIS — E042 Nontoxic multinodular goiter: Secondary | ICD-10-CM

## 2017-08-26 ENCOUNTER — Ambulatory Visit: Payer: 59

## 2017-08-29 ENCOUNTER — Other Ambulatory Visit: Payer: 59

## 2017-08-31 DIAGNOSIS — M9902 Segmental and somatic dysfunction of thoracic region: Secondary | ICD-10-CM | POA: Diagnosis not present

## 2017-08-31 DIAGNOSIS — M9901 Segmental and somatic dysfunction of cervical region: Secondary | ICD-10-CM | POA: Diagnosis not present

## 2017-08-31 DIAGNOSIS — M542 Cervicalgia: Secondary | ICD-10-CM | POA: Diagnosis not present

## 2017-09-01 ENCOUNTER — Ambulatory Visit
Admission: RE | Admit: 2017-09-01 | Discharge: 2017-09-01 | Disposition: A | Discharge status: Home / Self Care | Payer: 59 | Source: Ambulatory Visit | Attending: Surgery | Admitting: Surgery

## 2017-09-01 DIAGNOSIS — E042 Nontoxic multinodular goiter: Secondary | ICD-10-CM

## 2017-09-05 DIAGNOSIS — M542 Cervicalgia: Secondary | ICD-10-CM | POA: Diagnosis not present

## 2017-09-05 DIAGNOSIS — M9901 Segmental and somatic dysfunction of cervical region: Secondary | ICD-10-CM | POA: Diagnosis not present

## 2017-09-05 DIAGNOSIS — M9902 Segmental and somatic dysfunction of thoracic region: Secondary | ICD-10-CM | POA: Diagnosis not present

## 2017-09-09 DIAGNOSIS — M542 Cervicalgia: Secondary | ICD-10-CM | POA: Diagnosis not present

## 2017-09-09 DIAGNOSIS — M9902 Segmental and somatic dysfunction of thoracic region: Secondary | ICD-10-CM | POA: Diagnosis not present

## 2017-09-09 DIAGNOSIS — E042 Nontoxic multinodular goiter: Secondary | ICD-10-CM | POA: Diagnosis not present

## 2017-09-09 DIAGNOSIS — M9901 Segmental and somatic dysfunction of cervical region: Secondary | ICD-10-CM | POA: Diagnosis not present

## 2017-09-15 DIAGNOSIS — E042 Nontoxic multinodular goiter: Secondary | ICD-10-CM | POA: Diagnosis not present

## 2017-09-21 ENCOUNTER — Other Ambulatory Visit: Payer: Self-pay | Admitting: Internal Medicine

## 2017-09-21 ENCOUNTER — Ambulatory Visit
Admission: RE | Admit: 2017-09-21 | Discharge: 2017-09-21 | Disposition: A | Discharge status: Home / Self Care | Payer: 59 | Source: Ambulatory Visit | Attending: Internal Medicine | Admitting: Internal Medicine

## 2017-09-21 DIAGNOSIS — Z1231 Encounter for screening mammogram for malignant neoplasm of breast: Secondary | ICD-10-CM

## 2017-09-21 DIAGNOSIS — R928 Other abnormal and inconclusive findings on diagnostic imaging of breast: Secondary | ICD-10-CM

## 2017-09-27 ENCOUNTER — Ambulatory Visit
Admission: RE | Admit: 2017-09-27 | Discharge: 2017-09-27 | Disposition: A | Discharge status: Home / Self Care | Payer: 59 | Source: Ambulatory Visit | Attending: Internal Medicine | Admitting: Internal Medicine

## 2017-09-27 DIAGNOSIS — R928 Other abnormal and inconclusive findings on diagnostic imaging of breast: Secondary | ICD-10-CM

## 2017-09-27 DIAGNOSIS — N6012 Diffuse cystic mastopathy of left breast: Secondary | ICD-10-CM | POA: Diagnosis not present

## 2017-09-28 DIAGNOSIS — M9902 Segmental and somatic dysfunction of thoracic region: Secondary | ICD-10-CM | POA: Diagnosis not present

## 2017-09-28 DIAGNOSIS — M9901 Segmental and somatic dysfunction of cervical region: Secondary | ICD-10-CM | POA: Diagnosis not present

## 2017-09-28 DIAGNOSIS — M542 Cervicalgia: Secondary | ICD-10-CM | POA: Diagnosis not present

## 2017-10-04 ENCOUNTER — Other Ambulatory Visit (INDEPENDENT_AMBULATORY_CARE_PROVIDER_SITE_OTHER): Payer: 59

## 2017-10-04 DIAGNOSIS — E119 Type 2 diabetes mellitus without complications: Secondary | ICD-10-CM

## 2017-10-04 DIAGNOSIS — Z0000 Encounter for general adult medical examination without abnormal findings: Secondary | ICD-10-CM

## 2017-10-04 DIAGNOSIS — Z114 Encounter for screening for human immunodeficiency virus [HIV]: Secondary | ICD-10-CM

## 2017-10-04 LAB — URINALYSIS, ROUTINE W REFLEX MICROSCOPIC
BILIRUBIN URINE: NEGATIVE
Hgb urine dipstick: NEGATIVE
Ketones, ur: NEGATIVE
Nitrite: NEGATIVE
PH: 6 (ref 5.0–8.0)
RBC / HPF: NONE SEEN (ref 0–?)
Specific Gravity, Urine: 1.01 (ref 1.000–1.030)
TOTAL PROTEIN, URINE-UPE24: NEGATIVE
Urine Glucose: NEGATIVE
Urobilinogen, UA: 0.2 (ref 0.0–1.0)

## 2017-10-04 LAB — LIPID PANEL
Cholesterol: 134 mg/dL (ref 0–200)
HDL: 46.7 mg/dL (ref >39.00)
LDL CALC: 55 mg/dL (ref 0–99)
NonHDL: 87.09
TRIGLYCERIDES: 161 mg/dL — AB (ref 0.0–149.0)
Total CHOL/HDL Ratio: 3
VLDL: 32.2 mg/dL (ref 0.0–40.0)

## 2017-10-04 LAB — BASIC METABOLIC PANEL
BUN: 14 mg/dL (ref 6–23)
CHLORIDE: 102 meq/L (ref 96–112)
CO2: 28 mEq/L (ref 19–32)
Calcium: 9.2 mg/dL (ref 8.4–10.5)
Creatinine, Ser: 0.78 mg/dL (ref 0.40–1.20)
GFR: 79.64 mL/min (ref >60.00)
Glucose, Bld: 134 mg/dL — ABNORMAL HIGH (ref 70–99)
POTASSIUM: 4 meq/L (ref 3.5–5.1)
Sodium: 139 mEq/L (ref 135–145)

## 2017-10-04 LAB — CBC WITH DIFFERENTIAL/PLATELET
BASOS PCT: 1.2 % (ref 0.0–3.0)
Basophils Absolute: 0.1 10*3/uL (ref 0.0–0.1)
Eosinophils Absolute: 0.3 10*3/uL (ref 0.0–0.7)
Eosinophils Relative: 4 % (ref 0.0–5.0)
HEMATOCRIT: 39 % (ref 36.0–46.0)
Hemoglobin: 12.9 g/dL (ref 12.0–15.0)
LYMPHS PCT: 35 % (ref 12.0–46.0)
Lymphs Abs: 2.4 10*3/uL (ref 0.7–4.0)
MCHC: 33 g/dL (ref 30.0–36.0)
MCV: 83.2 fl (ref 78.0–100.0)
MONOS PCT: 8.4 % (ref 3.0–12.0)
Monocytes Absolute: 0.6 10*3/uL (ref 0.1–1.0)
Neutro Abs: 3.4 10*3/uL (ref 1.4–7.7)
Neutrophils Relative %: 51.4 % (ref 43.0–77.0)
Platelets: 234 10*3/uL (ref 150.0–400.0)
RBC: 4.69 Mil/uL (ref 3.87–5.11)
RDW: 13.7 % (ref 11.5–15.5)
WBC: 6.7 10*3/uL (ref 4.0–10.5)

## 2017-10-04 LAB — HEPATIC FUNCTION PANEL
ALBUMIN: 4.4 g/dL (ref 3.5–5.2)
ALT: 26 U/L (ref 0–35)
AST: 21 U/L (ref 0–37)
Alkaline Phosphatase: 88 U/L (ref 39–117)
Bilirubin, Direct: 0.1 mg/dL (ref 0.0–0.3)
TOTAL PROTEIN: 7.1 g/dL (ref 6.0–8.3)
Total Bilirubin: 0.4 mg/dL (ref 0.2–1.2)

## 2017-10-04 LAB — MICROALBUMIN / CREATININE URINE RATIO
CREATININE, U: 37.8 mg/dL
Microalb Creat Ratio: 1.9 mg/g (ref 0.0–30.0)

## 2017-10-04 LAB — TSH: TSH: 1.39 u[IU]/mL (ref 0.35–4.50)

## 2017-10-04 LAB — HEMOGLOBIN A1C: Hgb A1c MFr Bld: 6.8 % — ABNORMAL HIGH (ref 4.6–6.5)

## 2017-10-05 ENCOUNTER — Ambulatory Visit: Payer: 59 | Admitting: Internal Medicine

## 2017-10-05 ENCOUNTER — Encounter: Payer: Self-pay | Admitting: Internal Medicine

## 2017-10-05 ENCOUNTER — Ambulatory Visit (INDEPENDENT_AMBULATORY_CARE_PROVIDER_SITE_OTHER): Payer: 59 | Admitting: Internal Medicine

## 2017-10-05 VITALS — BP 110/76 | HR 76 | Temp 98.0°F | Ht 61.5 in | Wt 173.0 lb

## 2017-10-05 DIAGNOSIS — Z23 Encounter for immunization: Secondary | ICD-10-CM | POA: Diagnosis not present

## 2017-10-05 DIAGNOSIS — J069 Acute upper respiratory infection, unspecified: Secondary | ICD-10-CM | POA: Diagnosis not present

## 2017-10-05 DIAGNOSIS — Z0001 Encounter for general adult medical examination with abnormal findings: Secondary | ICD-10-CM | POA: Diagnosis not present

## 2017-10-05 DIAGNOSIS — E785 Hyperlipidemia, unspecified: Secondary | ICD-10-CM | POA: Diagnosis not present

## 2017-10-05 DIAGNOSIS — E119 Type 2 diabetes mellitus without complications: Secondary | ICD-10-CM

## 2017-10-05 DIAGNOSIS — I10 Essential (primary) hypertension: Secondary | ICD-10-CM | POA: Diagnosis not present

## 2017-10-05 LAB — HIV ANTIBODY (ROUTINE TESTING W REFLEX): HIV: NONREACTIVE

## 2017-10-05 MED ORDER — ROSUVASTATIN CALCIUM 20 MG PO TABS: 20.0000 mg | ORAL_TABLET | Freq: Every day | ORAL | 3 refills | Status: DC

## 2017-10-05 MED ORDER — VALSARTAN-HYDROCHLOROTHIAZIDE 320-25 MG PO TABS: 1.0000 | ORAL_TABLET | Freq: Every day | ORAL | 3 refills | Status: DC

## 2017-10-05 MED ORDER — POTASSIUM CHLORIDE CRYS ER 20 MEQ PO TBCR: 20.0000 meq | EXTENDED_RELEASE_TABLET | Freq: Two times a day (BID) | ORAL | 3 refills | Status: DC

## 2017-10-05 MED ORDER — ZOSTER VAC RECOMB ADJUVANTED 50 MCG/0.5ML IM SUSR: 0.5000 mL | Freq: Once | INTRAMUSCULAR | 1 refills | Status: AC

## 2017-10-05 MED ORDER — AZITHROMYCIN 250 MG PO TABS: ORAL_TABLET | ORAL | 1 refills | Status: DC

## 2017-10-05 NOTE — Assessment & Plan Note (Signed)
Lab Results  Component Value Date   LDLCALC 55 10/04/2017  stable overall by history and exam, recent data reviewed with pt, and pt to continue medical treatment as before,  to f/u any worsening symptoms or concerns

## 2017-10-05 NOTE — Assessment & Plan Note (Signed)
stable overall by history and exam, recent data reviewed with pt, and pt to continue medical treatment as before,  to f/u any worsening symptoms or concerns Lab Results  Component Value Date   HGBA1C 6.8 (H) 10/04/2017

## 2017-10-05 NOTE — Assessment & Plan Note (Signed)
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
## 2017-10-05 NOTE — Assessment & Plan Note (Addendum)
Mild to mod, for antibx course,  to f/u any worsening symptoms or concerns  In addition to the time spent performing CPE, I spent an additional 10 minutes face to face,in which greater than 50% of this time was spent in counseling and coordination of care for patient's illness as documented, including the differential dx, treatment, further evaluation and other management of acute upper resp infection, DM and HTN, HLD

## 2017-10-05 NOTE — Assessment & Plan Note (Signed)
stable overall by history and exam, recent data reviewed with pt, and pt to continue medical treatment as before,  to f/u any worsening symptoms or concerns BP Readings from Last 3 Encounters:  10/05/17 110/76  05/17/17 136/74  03/31/17 136/78

## 2017-10-05 NOTE — Patient Instructions (Addendum)
You had the Prevnar 13 pneumonia shot  Your shingles shot prescription was sent to the pharmacy  Please take all new medication as prescribed - the antibiotic  Please continue all other medications as before, and refills have been done if requested.  Please have the pharmacy call with any other refills you may need.  Please continue your efforts at being more active, low cholesterol diet, and weight control.  You are otherwise up to date with prevention measures today.  Please keep your appointments with your specialists as you may have planned  Please return in 6 months, or sooner if needed, with Lab testing done 3-5 days before

## 2017-10-07 DIAGNOSIS — M9902 Segmental and somatic dysfunction of thoracic region: Secondary | ICD-10-CM | POA: Diagnosis not present

## 2017-10-07 DIAGNOSIS — M542 Cervicalgia: Secondary | ICD-10-CM | POA: Diagnosis not present

## 2017-10-07 DIAGNOSIS — M9901 Segmental and somatic dysfunction of cervical region: Secondary | ICD-10-CM | POA: Diagnosis not present

## 2017-10-12 DIAGNOSIS — M542 Cervicalgia: Secondary | ICD-10-CM | POA: Diagnosis not present

## 2017-10-12 DIAGNOSIS — M9902 Segmental and somatic dysfunction of thoracic region: Secondary | ICD-10-CM | POA: Diagnosis not present

## 2017-10-12 DIAGNOSIS — M9901 Segmental and somatic dysfunction of cervical region: Secondary | ICD-10-CM | POA: Diagnosis not present

## 2017-10-14 DIAGNOSIS — M542 Cervicalgia: Secondary | ICD-10-CM | POA: Diagnosis not present

## 2017-10-14 DIAGNOSIS — M9902 Segmental and somatic dysfunction of thoracic region: Secondary | ICD-10-CM | POA: Diagnosis not present

## 2017-10-14 DIAGNOSIS — M9901 Segmental and somatic dysfunction of cervical region: Secondary | ICD-10-CM | POA: Diagnosis not present

## 2017-10-19 DIAGNOSIS — M9901 Segmental and somatic dysfunction of cervical region: Secondary | ICD-10-CM | POA: Diagnosis not present

## 2017-10-19 DIAGNOSIS — M9902 Segmental and somatic dysfunction of thoracic region: Secondary | ICD-10-CM | POA: Diagnosis not present

## 2017-10-19 DIAGNOSIS — M542 Cervicalgia: Secondary | ICD-10-CM | POA: Diagnosis not present

## 2017-11-07 DIAGNOSIS — M9902 Segmental and somatic dysfunction of thoracic region: Secondary | ICD-10-CM | POA: Diagnosis not present

## 2017-11-07 DIAGNOSIS — M9901 Segmental and somatic dysfunction of cervical region: Secondary | ICD-10-CM | POA: Diagnosis not present

## 2017-11-07 DIAGNOSIS — M542 Cervicalgia: Secondary | ICD-10-CM | POA: Diagnosis not present

## 2017-11-16 DIAGNOSIS — M542 Cervicalgia: Secondary | ICD-10-CM | POA: Diagnosis not present

## 2017-11-16 DIAGNOSIS — M9901 Segmental and somatic dysfunction of cervical region: Secondary | ICD-10-CM | POA: Diagnosis not present

## 2017-11-16 DIAGNOSIS — M9902 Segmental and somatic dysfunction of thoracic region: Secondary | ICD-10-CM | POA: Diagnosis not present

## 2017-11-17 DIAGNOSIS — R350 Frequency of micturition: Secondary | ICD-10-CM | POA: Diagnosis not present

## 2017-11-17 DIAGNOSIS — N3020 Other chronic cystitis without hematuria: Secondary | ICD-10-CM | POA: Diagnosis not present

## 2017-11-23 DIAGNOSIS — L821 Other seborrheic keratosis: Secondary | ICD-10-CM | POA: Diagnosis not present

## 2017-11-23 DIAGNOSIS — D229 Melanocytic nevi, unspecified: Secondary | ICD-10-CM | POA: Diagnosis not present

## 2017-11-23 DIAGNOSIS — L814 Other melanin hyperpigmentation: Secondary | ICD-10-CM | POA: Diagnosis not present

## 2017-11-28 DIAGNOSIS — M25561 Pain in right knee: Secondary | ICD-10-CM | POA: Diagnosis not present

## 2017-11-28 DIAGNOSIS — M79661 Pain in right lower leg: Secondary | ICD-10-CM | POA: Diagnosis not present

## 2017-11-30 DIAGNOSIS — M9902 Segmental and somatic dysfunction of thoracic region: Secondary | ICD-10-CM | POA: Diagnosis not present

## 2017-11-30 DIAGNOSIS — M9901 Segmental and somatic dysfunction of cervical region: Secondary | ICD-10-CM | POA: Diagnosis not present

## 2017-11-30 DIAGNOSIS — M542 Cervicalgia: Secondary | ICD-10-CM | POA: Diagnosis not present

## 2017-12-07 DIAGNOSIS — M542 Cervicalgia: Secondary | ICD-10-CM | POA: Diagnosis not present

## 2017-12-07 DIAGNOSIS — M9901 Segmental and somatic dysfunction of cervical region: Secondary | ICD-10-CM | POA: Diagnosis not present

## 2017-12-07 DIAGNOSIS — M9902 Segmental and somatic dysfunction of thoracic region: Secondary | ICD-10-CM | POA: Diagnosis not present

## 2017-12-08 DIAGNOSIS — M25561 Pain in right knee: Secondary | ICD-10-CM | POA: Diagnosis not present

## 2017-12-08 DIAGNOSIS — M79661 Pain in right lower leg: Secondary | ICD-10-CM | POA: Diagnosis not present

## 2017-12-14 DIAGNOSIS — M25561 Pain in right knee: Secondary | ICD-10-CM | POA: Diagnosis not present

## 2017-12-20 DIAGNOSIS — M1711 Unilateral primary osteoarthritis, right knee: Secondary | ICD-10-CM | POA: Diagnosis not present

## 2017-12-20 DIAGNOSIS — M25561 Pain in right knee: Secondary | ICD-10-CM | POA: Diagnosis not present

## 2017-12-21 DIAGNOSIS — M9901 Segmental and somatic dysfunction of cervical region: Secondary | ICD-10-CM | POA: Diagnosis not present

## 2017-12-21 DIAGNOSIS — M9902 Segmental and somatic dysfunction of thoracic region: Secondary | ICD-10-CM | POA: Diagnosis not present

## 2017-12-21 DIAGNOSIS — M542 Cervicalgia: Secondary | ICD-10-CM | POA: Diagnosis not present

## 2018-01-05 DIAGNOSIS — J300 Vasomotor rhinitis: Secondary | ICD-10-CM | POA: Diagnosis not present

## 2018-01-05 DIAGNOSIS — H1045 Other chronic allergic conjunctivitis: Secondary | ICD-10-CM | POA: Diagnosis not present

## 2018-01-26 DIAGNOSIS — M25561 Pain in right knee: Secondary | ICD-10-CM | POA: Diagnosis not present

## 2018-01-26 DIAGNOSIS — M1711 Unilateral primary osteoarthritis, right knee: Secondary | ICD-10-CM | POA: Diagnosis not present

## 2018-02-05 IMAGING — DX DG CHEST 2V
2 series · 2 of 2 positions shown · non-contrast
Comparison: September 09, 2016

CLINICAL DATA: Hypertension.  Prior scarring left lung base.

EXAM:
CHEST  2 VIEW

[chest pa]
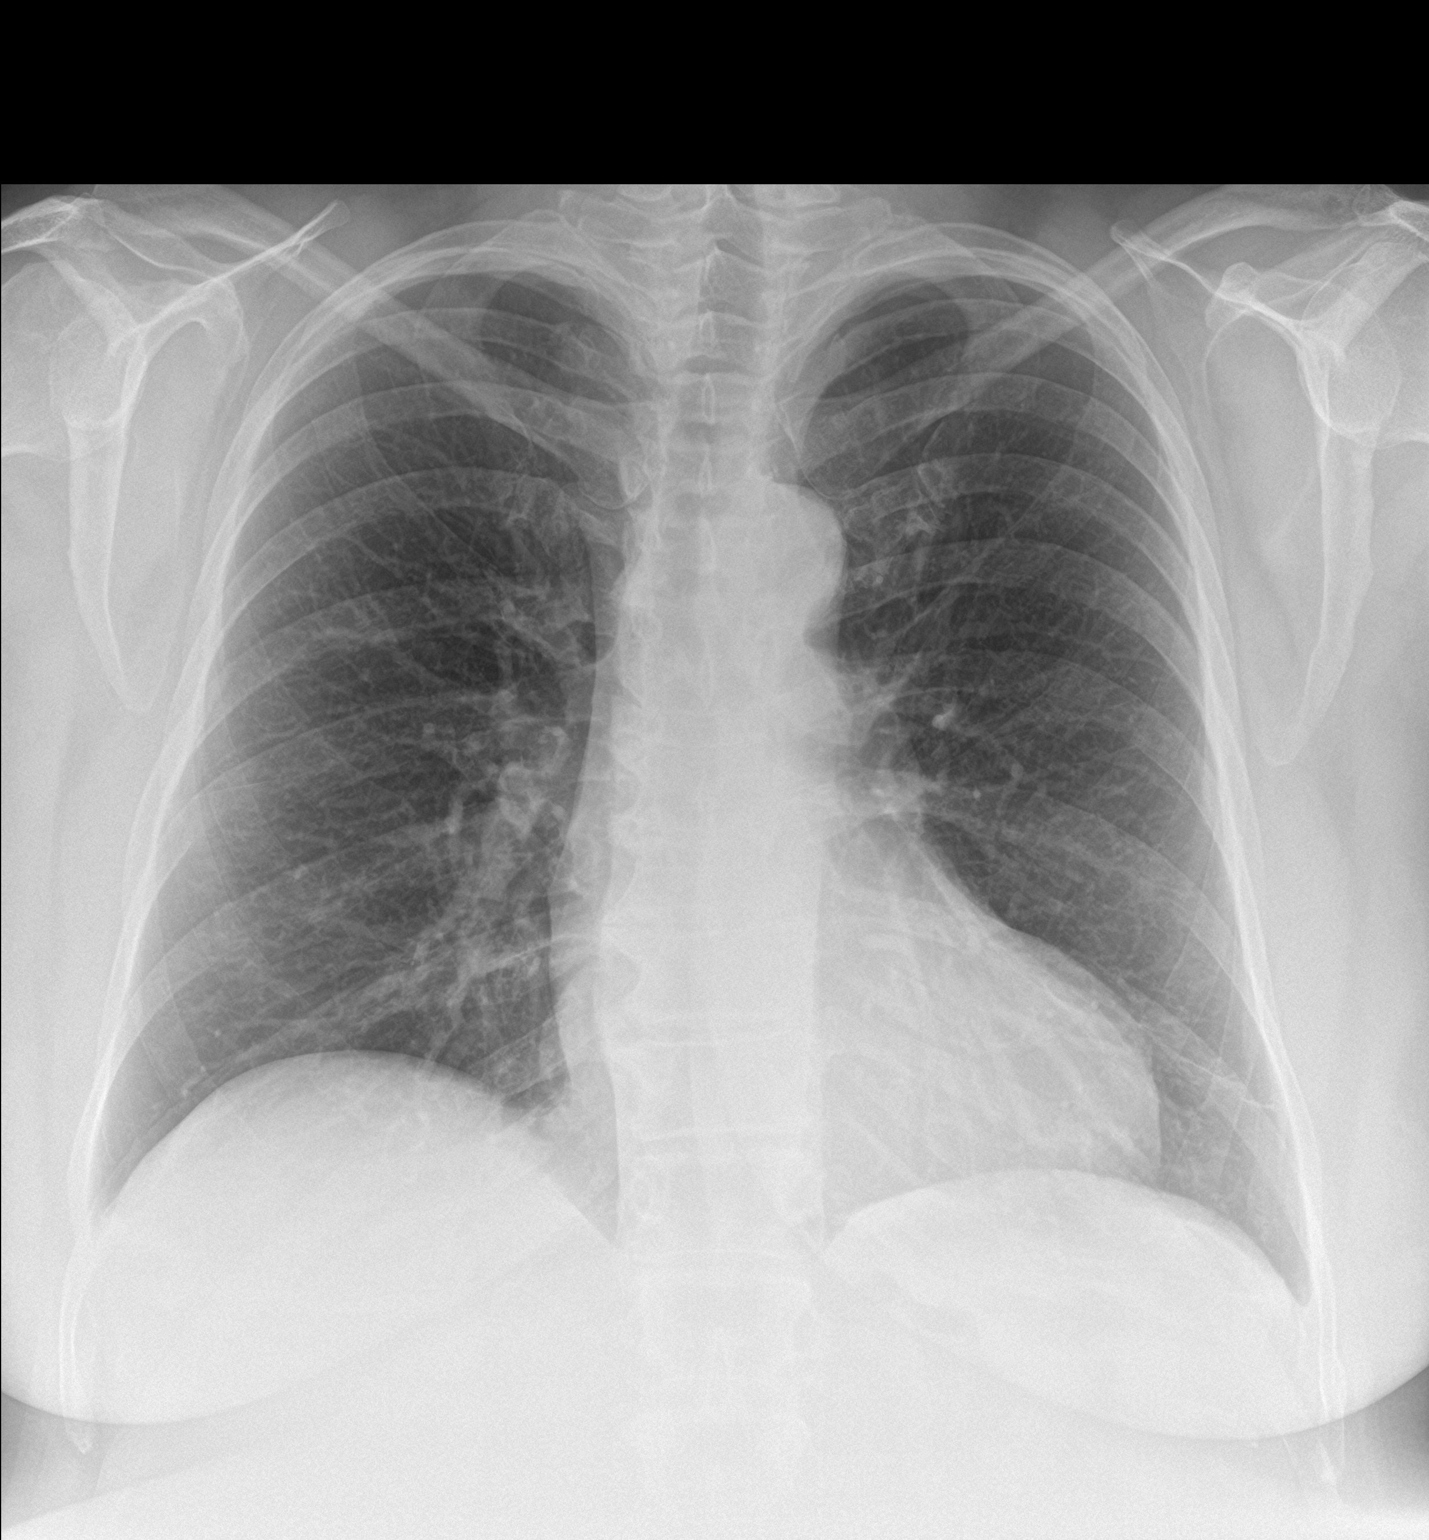

[chest lat]
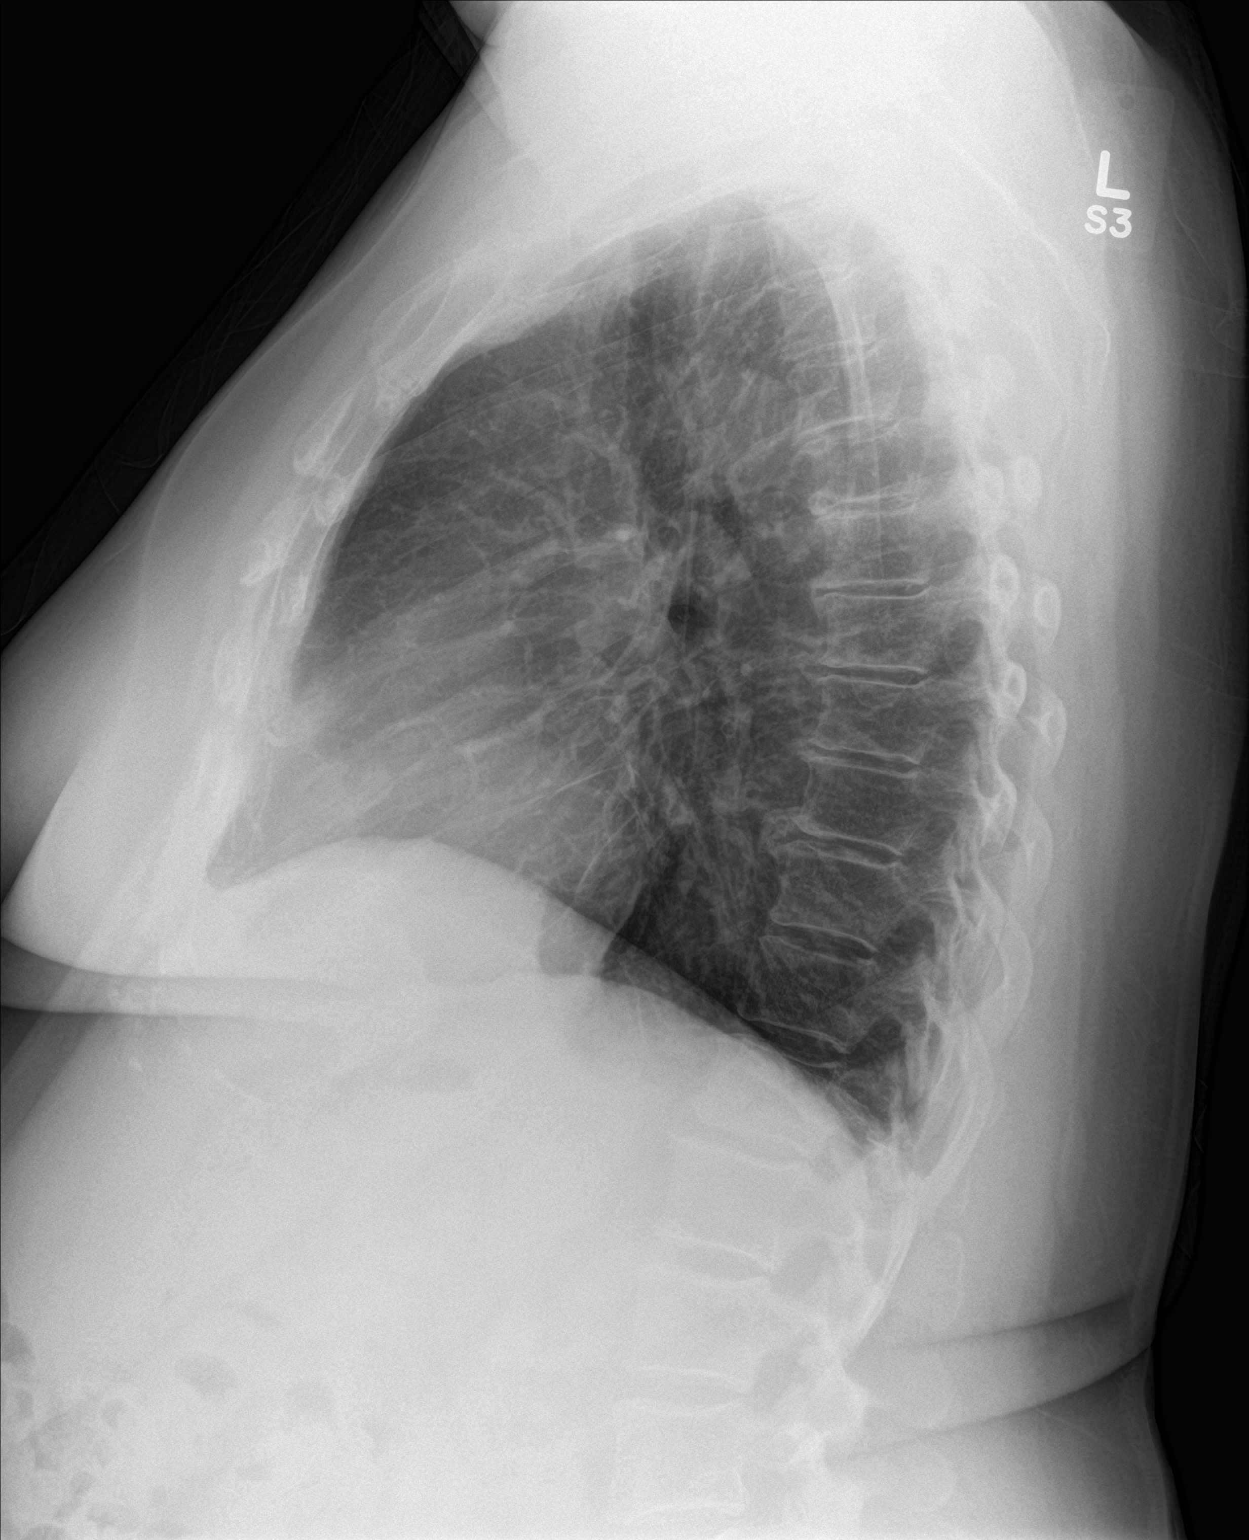

[2 of 2 positions shown; findings below may reference images not displayed]

FINDINGS: Mild scarring in the left base is stable. Lungs elsewhere are clear.
Heart size and pulmonary vascularity are normal. No adenopathy.
There is mild degenerative change in thoracic spine.
IMPRESSION: Stable mild scarring left base. Lungs elsewhere clear. Stable
cardiac silhouette.

## 2018-02-27 DIAGNOSIS — M542 Cervicalgia: Secondary | ICD-10-CM | POA: Diagnosis not present

## 2018-02-27 DIAGNOSIS — M9902 Segmental and somatic dysfunction of thoracic region: Secondary | ICD-10-CM | POA: Diagnosis not present

## 2018-02-27 DIAGNOSIS — M9901 Segmental and somatic dysfunction of cervical region: Secondary | ICD-10-CM | POA: Diagnosis not present

## 2018-03-06 DIAGNOSIS — M542 Cervicalgia: Secondary | ICD-10-CM | POA: Diagnosis not present

## 2018-03-06 DIAGNOSIS — M9901 Segmental and somatic dysfunction of cervical region: Secondary | ICD-10-CM | POA: Diagnosis not present

## 2018-03-06 DIAGNOSIS — M9902 Segmental and somatic dysfunction of thoracic region: Secondary | ICD-10-CM | POA: Diagnosis not present

## 2018-03-07 DIAGNOSIS — M9902 Segmental and somatic dysfunction of thoracic region: Secondary | ICD-10-CM | POA: Diagnosis not present

## 2018-03-07 DIAGNOSIS — M542 Cervicalgia: Secondary | ICD-10-CM | POA: Diagnosis not present

## 2018-03-07 DIAGNOSIS — H60333 Swimmer's ear, bilateral: Secondary | ICD-10-CM | POA: Diagnosis not present

## 2018-03-07 DIAGNOSIS — M9901 Segmental and somatic dysfunction of cervical region: Secondary | ICD-10-CM | POA: Diagnosis not present

## 2018-03-07 DIAGNOSIS — H6123 Impacted cerumen, bilateral: Secondary | ICD-10-CM | POA: Diagnosis not present

## 2018-03-09 DIAGNOSIS — M9901 Segmental and somatic dysfunction of cervical region: Secondary | ICD-10-CM | POA: Diagnosis not present

## 2018-03-09 DIAGNOSIS — M9902 Segmental and somatic dysfunction of thoracic region: Secondary | ICD-10-CM | POA: Diagnosis not present

## 2018-03-09 DIAGNOSIS — M542 Cervicalgia: Secondary | ICD-10-CM | POA: Diagnosis not present

## 2018-03-13 DIAGNOSIS — M542 Cervicalgia: Secondary | ICD-10-CM | POA: Diagnosis not present

## 2018-03-13 DIAGNOSIS — M9902 Segmental and somatic dysfunction of thoracic region: Secondary | ICD-10-CM | POA: Diagnosis not present

## 2018-03-13 DIAGNOSIS — M9901 Segmental and somatic dysfunction of cervical region: Secondary | ICD-10-CM | POA: Diagnosis not present

## 2018-03-15 DIAGNOSIS — M9901 Segmental and somatic dysfunction of cervical region: Secondary | ICD-10-CM | POA: Diagnosis not present

## 2018-03-15 DIAGNOSIS — M542 Cervicalgia: Secondary | ICD-10-CM | POA: Diagnosis not present

## 2018-03-15 DIAGNOSIS — M9902 Segmental and somatic dysfunction of thoracic region: Secondary | ICD-10-CM | POA: Diagnosis not present

## 2018-03-17 DIAGNOSIS — M9902 Segmental and somatic dysfunction of thoracic region: Secondary | ICD-10-CM | POA: Diagnosis not present

## 2018-03-17 DIAGNOSIS — M542 Cervicalgia: Secondary | ICD-10-CM | POA: Diagnosis not present

## 2018-03-17 DIAGNOSIS — M9901 Segmental and somatic dysfunction of cervical region: Secondary | ICD-10-CM | POA: Diagnosis not present

## 2018-03-20 DIAGNOSIS — M542 Cervicalgia: Secondary | ICD-10-CM | POA: Diagnosis not present

## 2018-03-20 DIAGNOSIS — M9902 Segmental and somatic dysfunction of thoracic region: Secondary | ICD-10-CM | POA: Diagnosis not present

## 2018-03-20 DIAGNOSIS — M9901 Segmental and somatic dysfunction of cervical region: Secondary | ICD-10-CM | POA: Diagnosis not present

## 2018-03-22 DIAGNOSIS — M9902 Segmental and somatic dysfunction of thoracic region: Secondary | ICD-10-CM | POA: Diagnosis not present

## 2018-03-22 DIAGNOSIS — M9901 Segmental and somatic dysfunction of cervical region: Secondary | ICD-10-CM | POA: Diagnosis not present

## 2018-03-22 DIAGNOSIS — M542 Cervicalgia: Secondary | ICD-10-CM | POA: Diagnosis not present

## 2018-03-27 DIAGNOSIS — M542 Cervicalgia: Secondary | ICD-10-CM | POA: Diagnosis not present

## 2018-03-27 DIAGNOSIS — M9902 Segmental and somatic dysfunction of thoracic region: Secondary | ICD-10-CM | POA: Diagnosis not present

## 2018-03-27 DIAGNOSIS — M9901 Segmental and somatic dysfunction of cervical region: Secondary | ICD-10-CM | POA: Diagnosis not present

## 2018-03-28 DIAGNOSIS — M25561 Pain in right knee: Secondary | ICD-10-CM | POA: Diagnosis not present

## 2018-03-28 DIAGNOSIS — M1711 Unilateral primary osteoarthritis, right knee: Secondary | ICD-10-CM | POA: Diagnosis not present

## 2018-03-28 DIAGNOSIS — M79661 Pain in right lower leg: Secondary | ICD-10-CM | POA: Diagnosis not present

## 2018-03-31 DIAGNOSIS — M542 Cervicalgia: Secondary | ICD-10-CM | POA: Diagnosis not present

## 2018-03-31 DIAGNOSIS — M9902 Segmental and somatic dysfunction of thoracic region: Secondary | ICD-10-CM | POA: Diagnosis not present

## 2018-03-31 DIAGNOSIS — M9901 Segmental and somatic dysfunction of cervical region: Secondary | ICD-10-CM | POA: Diagnosis not present

## 2018-04-04 ENCOUNTER — Ambulatory Visit: Payer: 59 | Admitting: Internal Medicine

## 2018-04-10 DIAGNOSIS — M9902 Segmental and somatic dysfunction of thoracic region: Secondary | ICD-10-CM | POA: Diagnosis not present

## 2018-04-10 DIAGNOSIS — M9901 Segmental and somatic dysfunction of cervical region: Secondary | ICD-10-CM | POA: Diagnosis not present

## 2018-04-10 DIAGNOSIS — M542 Cervicalgia: Secondary | ICD-10-CM | POA: Diagnosis not present

## 2018-04-11 ENCOUNTER — Other Ambulatory Visit (INDEPENDENT_AMBULATORY_CARE_PROVIDER_SITE_OTHER): Payer: 59

## 2018-04-11 DIAGNOSIS — E119 Type 2 diabetes mellitus without complications: Secondary | ICD-10-CM

## 2018-04-11 LAB — HEPATIC FUNCTION PANEL
ALT: 33 U/L (ref 0–35)
AST: 28 U/L (ref 0–37)
Albumin: 4.4 g/dL (ref 3.5–5.2)
Alkaline Phosphatase: 83 U/L (ref 39–117)
BILIRUBIN DIRECT: 0.1 mg/dL (ref 0.0–0.3)
BILIRUBIN TOTAL: 0.4 mg/dL (ref 0.2–1.2)
TOTAL PROTEIN: 7.1 g/dL (ref 6.0–8.3)

## 2018-04-11 LAB — LIPID PANEL
CHOLESTEROL: 125 mg/dL (ref 0–200)
HDL: 36.9 mg/dL — AB (ref >39.00)
LDL Cholesterol: 48 mg/dL (ref 0–99)
NonHDL: 88.14
Total CHOL/HDL Ratio: 3
Triglycerides: 199 mg/dL — ABNORMAL HIGH (ref 0.0–149.0)
VLDL: 39.8 mg/dL (ref 0.0–40.0)

## 2018-04-11 LAB — HEMOGLOBIN A1C: HEMOGLOBIN A1C: 7.1 % — AB (ref 4.6–6.5)

## 2018-04-11 LAB — BASIC METABOLIC PANEL
BUN: 12 mg/dL (ref 6–23)
CHLORIDE: 103 meq/L (ref 96–112)
CO2: 25 mEq/L (ref 19–32)
Calcium: 9.3 mg/dL (ref 8.4–10.5)
Creatinine, Ser: 0.98 mg/dL (ref 0.40–1.20)
GFR: 61.09 mL/min (ref >60.00)
Glucose, Bld: 154 mg/dL — ABNORMAL HIGH (ref 70–99)
POTASSIUM: 3.9 meq/L (ref 3.5–5.1)
Sodium: 139 mEq/L (ref 135–145)

## 2018-04-12 ENCOUNTER — Encounter: Payer: Self-pay | Admitting: Internal Medicine

## 2018-04-12 ENCOUNTER — Ambulatory Visit: Payer: 59 | Admitting: Internal Medicine

## 2018-04-12 VITALS — BP 116/76 | HR 83 | Temp 97.9°F | Ht 61.5 in | Wt 181.0 lb

## 2018-04-12 DIAGNOSIS — E785 Hyperlipidemia, unspecified: Secondary | ICD-10-CM

## 2018-04-12 DIAGNOSIS — J069 Acute upper respiratory infection, unspecified: Secondary | ICD-10-CM | POA: Diagnosis not present

## 2018-04-12 DIAGNOSIS — I10 Essential (primary) hypertension: Secondary | ICD-10-CM

## 2018-04-12 DIAGNOSIS — E119 Type 2 diabetes mellitus without complications: Secondary | ICD-10-CM | POA: Diagnosis not present

## 2018-04-12 MED ORDER — AZITHROMYCIN 250 MG PO TABS: ORAL_TABLET | ORAL | 1 refills | Status: DC

## 2018-04-12 NOTE — Patient Instructions (Addendum)
Please take all new medication as prescribed - the antibiotic  Please continue all other medications as before, and refills have been done if requested.  Please have the pharmacy call with any other refills you may need.  Please continue your efforts at being more active, low cholesterol diabetic diet, and weight control  Please keep your appointments with your specialists as you may have planned  Please return in 6 months, or sooner if needed, with Lab testing done 3-5 days before

## 2018-04-12 NOTE — Assessment & Plan Note (Signed)
Mild to mod, for antibx course,  to f/u any worsening symptoms or concerns 

## 2018-04-12 NOTE — Assessment & Plan Note (Signed)
stable overall by history and exam, recent data reviewed with pt, and pt to continue medical treatment as before,  to f/u any worsening symptoms or concerns  

## 2018-04-12 NOTE — Progress Notes (Signed)
Subjective:    Patient ID: [name] [name], female    DOB: 04-01-1956, 62 y.o.   MRN: [id number]  HPI   Here with 2-3 days acute onset fever, facial pain, pressure, headache, general weakness and malaise, and greenish d/c, with mild ST and cough, but pt denies chest pain, wheezing, increased sob or doe, orthopnea, PND, increased LE swelling, palpitations, dizziness or syncope. Pt denies new neurological symptoms such as new headache, or facial or extremity weakness or numbness.  Pt denies polydipsia, polyuria, or low sugar episode.  Pt states overall good compliance with meds, mostly trying to follow appropriate diet, with wt overall stable,  but little exercise however.  Gained 8 lbs with less active after small healing right knee meniscal tear, no surgury, pain is improving and plans to be more active soon.   Wt Readings from Last 3 Encounters:  04/12/18 181 lb (82.1 kg)  10/05/17 173 lb (78.5 kg)  05/17/17 173 lb (78.5 kg)  Did not needed for PT, taking nsaid prn, f/u Dr Delilah Shan for early next month.  No other new complaints Past Medical History:  Diagnosis Date  . ALLERGIC RHINITIS 04/06/2007  . ANXIETY 04/06/2007  . Arthritis   . CIN I (cervical intraepithelial neoplasia I)   . COLONIC POLYPS 08/04/2007  . Diabetes mellitus without complication ([place])   . DIARRHEA, ACUTE 09/23/2010  . Fibroid   . GASTRIC POLYP 08/04/2007  . GERD 08/04/2007  . GOITER, MULTINODULAR 03/10/2009  . Headache(784.0) 11/13/2008  . Hx of blood clots   . HYPERLIPIDEMIA 04/06/2007  . HYPERTENSION 04/06/2007  . ISCHEMIC COLITIS, HX OF 07/06/2007  . Lichen sclerosus 7829  . Osteopenia 12/2012   T score -1.1 FRAX 5.9%/0.3%  . Palpitations 03/10/2009  . Rosacea 11/30/2007  . THYROID NODULE, LEFT 11/04/2010  . Urinary frequency   . UTI'S, RECURRENT 11/30/2007   Past Surgical History:  Procedure Laterality Date  . APPENDECTOMY  2005  . bladder tack    . COLPOSCOPY    . CYSTOSCOPY  2007  . DERMOID CYST  EXCISION   1994   right ovary  . FLEXIBLE SIGMOIDOSCOPY  11/16/95  . Lt. pectoral/subclavicular abscess drainage  2006  . removed skull cyst  1973  . right foot neuroma  1988  . uterine fibroids removed  1996    reports that she has never smoked. She has never used smokeless tobacco. She reports that she drinks alcohol. She reports that she does not use drugs. family history includes Breast cancer in her paternal aunt; Deep vein thrombosis in her brother; Diabetes in her mother; Emphysema in her father; Heart disease in her father and mother; Hypertension in her mother; Lung cancer in her maternal grandmother, maternal uncle, and mother; Stroke in her brother and father. Allergies  Allergen Reactions  . Atorvastatin     REACTION: extreme muscle aches  . Cefuroxime Axetil     REACTION: DIARRHEA  . Ciprofloxacin     REACTION: diarrhea  . Latex     REACTION: rash  . Lipitor [Atorvastatin Calcium] Other (See Comments)    sensitive  . Moxifloxacin     REACTION: DIARRHEA   Current Outpatient Medications on File Prior to Visit  Medication Sig Dispense Refill  . aspirin 81 MG EC tablet Take 81 mg by mouth daily.      . Azelastine-Fluticasone (DYMISTA NA) Place into the nose.    . Blood Glucose Monitoring Suppl (ONE TOUCH ULTRA 2) W/DEVICE KIT Use as directed 1 per  day 1 each 0  . Esomeprazole Magnesium (NEXIUM 24HR PO) Take by mouth daily.    Marland Kitchen glucose blood test strip Use as instructed 100 each 12  . Lancets MISC Use as directed 1 per day 100 each 12  . levocetirizine (XYZAL) 5 MG tablet Take 5 mg by mouth every evening.    . Multiple Vitamins-Minerals (MULTIVITAMIN PO) Take by mouth daily.    . potassium chloride SA (K-DUR,KLOR-CON) 20 MEQ tablet Take 1 tablet (20 mEq total) by mouth 2 (two) times daily. 180 tablet 3  . rosuvastatin (CRESTOR) 20 MG tablet Take 1 tablet (20 mg total) by mouth daily. 90 tablet 3  . trimethoprim (TRIMPEX) 100 MG tablet Take 100 mg by mouth 2 (two) times daily.    .  valsartan-hydrochlorothiazide (DIOVAN-HCT) 320-25 MG tablet Take 1 tablet by mouth daily. 90 tablet 3   No current facility-administered medications on file prior to visit.    Review of Systems  Constitutional: Negative for other unusual diaphoresis or sweats HENT: Negative for ear discharge or swelling Eyes: Negative for other worsening visual disturbances Respiratory: Negative for stridor or other swelling  Gastrointestinal: Negative for worsening distension or other blood Genitourinary: Negative for retention or other urinary change Musculoskeletal: Negative for other MSK pain or swelling Skin: Negative for color change or other new lesions Neurological: Negative for worsening tremors and other numbness  Psychiatric/Behavioral: Negative for worsening agitation or other fatigue All other system neg per pt    Objective:   Physical Exam BP 116/76   Pulse 83   Temp 97.9 F (36.6 C) (Oral)   Ht 5' 1.5" (1.562 m)   Wt 181 lb (82.1 kg)   SpO2 96%   BMI 33.65 kg/m  VS noted, mild ill Constitutional: Pt appears in NAD HENT: Head: NCAT.  Right Ear: External ear normal.  Left Ear: External ear normal.  Bilat tm's with mild erythema.  Max sinus areas non tender.  Pharynx with mild erythema, no exudate Eyes: . Pupils are equal, round, and reactive to light. Conjunctivae and EOM are normal Nose: without d/c or deformity Neck: Neck supple. Gross normal ROM Cardiovascular: Normal rate and regular rhythm.   Pulmonary/Chest: Effort normal and breath sounds without rales or wheezing.  Neurological: Pt is alert. At baseline orientation, motor grossly intact Skin: Skin is warm. No rashes, other new lesions, no LE edema Psychiatric: Pt behavior is normal without agitation  No other exam findings Lab Results  Component Value Date   WBC 6.7 10/04/2017   HGB 12.9 10/04/2017   HCT 39.0 10/04/2017   PLT 234.0 10/04/2017   GLUCOSE 154 (H) 04/11/2018   CHOL 125 04/11/2018   TRIG 199.0 (H)  04/11/2018   HDL 36.90 (L) 04/11/2018   LDLDIRECT 83.0 03/24/2017   LDLCALC 48 04/11/2018   ALT 33 04/11/2018   AST 28 04/11/2018   NA 139 04/11/2018   K 3.9 04/11/2018   CL 103 04/11/2018   CREATININE 0.98 04/11/2018   BUN 12 04/11/2018   CO2 25 04/11/2018   TSH 1.39 10/04/2017   HGBA1C 7.1 (H) 04/11/2018   MICROALBUR <0.7 10/04/2017         Assessment & Plan:

## 2018-04-17 DIAGNOSIS — M542 Cervicalgia: Secondary | ICD-10-CM | POA: Diagnosis not present

## 2018-04-17 DIAGNOSIS — M9902 Segmental and somatic dysfunction of thoracic region: Secondary | ICD-10-CM | POA: Diagnosis not present

## 2018-04-17 DIAGNOSIS — M9901 Segmental and somatic dysfunction of cervical region: Secondary | ICD-10-CM | POA: Diagnosis not present

## 2018-04-26 DIAGNOSIS — M542 Cervicalgia: Secondary | ICD-10-CM | POA: Diagnosis not present

## 2018-04-26 DIAGNOSIS — M9902 Segmental and somatic dysfunction of thoracic region: Secondary | ICD-10-CM | POA: Diagnosis not present

## 2018-04-26 DIAGNOSIS — M9901 Segmental and somatic dysfunction of cervical region: Secondary | ICD-10-CM | POA: Diagnosis not present

## 2018-05-09 DIAGNOSIS — M1711 Unilateral primary osteoarthritis, right knee: Secondary | ICD-10-CM | POA: Diagnosis not present

## 2018-05-09 DIAGNOSIS — M25561 Pain in right knee: Secondary | ICD-10-CM | POA: Diagnosis not present

## 2018-06-12 ENCOUNTER — Encounter: Payer: Self-pay | Admitting: Internal Medicine

## 2018-06-12 ENCOUNTER — Ambulatory Visit (INDEPENDENT_AMBULATORY_CARE_PROVIDER_SITE_OTHER): Payer: 59 | Admitting: Internal Medicine

## 2018-06-12 VITALS — BP 118/80 | HR 66 | Temp 98.1°F | Ht 61.5 in | Wt 175.0 lb

## 2018-06-12 DIAGNOSIS — I10 Essential (primary) hypertension: Secondary | ICD-10-CM

## 2018-06-12 DIAGNOSIS — E119 Type 2 diabetes mellitus without complications: Secondary | ICD-10-CM

## 2018-06-12 DIAGNOSIS — Z6832 Body mass index (BMI) 32.0-32.9, adult: Secondary | ICD-10-CM

## 2018-06-12 DIAGNOSIS — E669 Obesity, unspecified: Secondary | ICD-10-CM | POA: Diagnosis not present

## 2018-06-12 LAB — POCT GLYCOSYLATED HEMOGLOBIN (HGB A1C): Hemoglobin A1C: 6.3 % — AB (ref 4.0–5.6)

## 2018-06-12 NOTE — Assessment & Plan Note (Signed)
stable overall by history and exam, recent data reviewed with pt, and pt to continue medical treatment as before,  to f/u any worsening symptoms or concerns  

## 2018-06-12 NOTE — Assessment & Plan Note (Signed)
Unable to lose wt this past year due to right knee injury mar 2019, no starting to improve and has been able to lose most recently 6 lbs, to cont increase activity and diet

## 2018-06-12 NOTE — Patient Instructions (Addendum)
Your A1c was OK today  Your form was filled out today  You will be contacted regarding the referral for: Nutrition  Please continue all other medications as before, and refills have been done if requested.  Please have the pharmacy call with any other refills you may need.  Please continue your efforts at being more active, low cholesterol diet, and weight control.  Please keep your appointments with your specialists as you may have planned

## 2018-06-12 NOTE — Addendum Note (Signed)
Addended by: Raford Pitcher R on: 06/12/2018 10:39 AM   Modules accepted: Orders

## 2018-06-12 NOTE — Assessment & Plan Note (Signed)
a1c reasonable today, stable overall by history and exam, recent data reviewed with pt, and pt to continue medical treatment as before,  to f/u any worsening symptoms or concerns

## 2018-06-12 NOTE — Progress Notes (Addendum)
Subjective:    Patient ID: [name] [name], female    DOB: 21-Oct-1955, 62 y.o.   MRN: [id number]  HPI  Here wit form for work health screening to fill out regarding her elevated BMI,  She has essentially laid up since mar 2019 with right knee meniscus tearing on top of DJD followed per ortho, but unable to gain exercise, and gained further weight due to impairment and pain. Recently has been doing much better - Lost 6 lbs with better diet. Asking for repeat A1c today though she realizes it is a bit early after the July 23.  Wt Readings from Last 3 Encounters:  06/12/18 175 lb (79.4 kg)  04/12/18 181 lb (82.1 kg)  10/05/17 173 lb (78.5 kg)   Past Medical History:  Diagnosis Date  . ALLERGIC RHINITIS 04/06/2007  . ANXIETY 04/06/2007  . Arthritis   . CIN I (cervical intraepithelial neoplasia I)   . COLONIC POLYPS 08/04/2007  . Diabetes mellitus without complication ([place])   . DIARRHEA, ACUTE 09/23/2010  . Fibroid   . GASTRIC POLYP 08/04/2007  . GERD 08/04/2007  . GOITER, MULTINODULAR 03/10/2009  . Headache(784.0) 11/13/2008  . Hx of blood clots   . HYPERLIPIDEMIA 04/06/2007  . HYPERTENSION 04/06/2007  . ISCHEMIC COLITIS, HX OF 07/06/2007  . Lichen sclerosus 6834  . Osteopenia 12/2012   T score -1.1 FRAX 5.9%/0.3%  . Palpitations 03/10/2009  . Rosacea 11/30/2007  . THYROID NODULE, LEFT 11/04/2010  . Urinary frequency   . UTI'S, RECURRENT 11/30/2007   Past Surgical History:  Procedure Laterality Date  . APPENDECTOMY  2005  . bladder tack    . COLPOSCOPY    . CYSTOSCOPY  2007  . DERMOID CYST  EXCISION  1994   right ovary  . FLEXIBLE SIGMOIDOSCOPY  11/16/95  . Lt. pectoral/subclavicular abscess drainage  2006  . removed skull cyst  1973  . right foot neuroma  1988  . uterine fibroids removed  1996    reports that she has never smoked. She has never used smokeless tobacco. She reports that she drinks alcohol. She reports that she does not use drugs. family history includes Breast  cancer in her paternal aunt; Deep vein thrombosis in her brother; Diabetes in her mother; Emphysema in her father; Heart disease in her father and mother; Hypertension in her mother; Lung cancer in her maternal grandmother, maternal uncle, and mother; Stroke in her brother and father. Allergies  Allergen Reactions  . Atorvastatin     REACTION: extreme muscle aches  . Cefuroxime Axetil     REACTION: DIARRHEA  . Ciprofloxacin     REACTION: diarrhea  . Latex     REACTION: rash  . Lipitor [Atorvastatin Calcium] Other (See Comments)    sensitive  . Moxifloxacin     REACTION: DIARRHEA   Current Outpatient Medications on File Prior to Visit  Medication Sig Dispense Refill  . aspirin 81 MG EC tablet Take 81 mg by mouth daily.      . Azelastine-Fluticasone (DYMISTA NA) Place into the nose.    . Blood Glucose Monitoring Suppl (ONE TOUCH ULTRA 2) W/DEVICE KIT Use as directed 1 per day 1 each 0  . Esomeprazole Magnesium (NEXIUM 24HR PO) Take by mouth daily.    Marland Kitchen glucose blood test strip Use as instructed 100 each 12  . Lancets MISC Use as directed 1 per day 100 each 12  . levocetirizine (XYZAL) 5 MG tablet Take 5 mg by mouth every evening.    Marland Kitchen  Multiple Vitamins-Minerals (MULTIVITAMIN PO) Take by mouth daily.    . potassium chloride SA (K-DUR,KLOR-CON) 20 MEQ tablet Take 1 tablet (20 mEq total) by mouth 2 (two) times daily. 180 tablet 3  . rosuvastatin (CRESTOR) 20 MG tablet Take 1 tablet (20 mg total) by mouth daily. 90 tablet 3  . trimethoprim (TRIMPEX) 100 MG tablet Take 100 mg by mouth 2 (two) times daily.    . valsartan-hydrochlorothiazide (DIOVAN-HCT) 320-25 MG tablet Take 1 tablet by mouth daily. 90 tablet 3   No current facility-administered medications on file prior to visit.    Review of Systems  Constitutional: Negative for other unusual diaphoresis or sweats HENT: Negative for ear discharge or swelling Eyes: Negative for other worsening visual disturbances Respiratory: Negative  for stridor or other swelling  Gastrointestinal: Negative for worsening distension or other blood Genitourinary: Negative for retention or other urinary change Musculoskeletal: Negative for other MSK pain or swelling Skin: Negative for color change or other new lesions Neurological: Negative for worsening tremors and other numbness  Psychiatric/Behavioral: Negative for worsening agitation or other fatigue ALl other system neg per pt    Objective:   Physical Exam BP 118/80 (BP Location: Left Arm, Patient Position: Sitting, Cuff Size: Normal)   Pulse 66   Temp 98.1 F (36.7 C) (Oral)   Ht 5' 1.5" (1.562 m)   Wt 175 lb (79.4 kg)   SpO2 95%   BMI 32.53 kg/m  VS noted,  Constitutional: Pt appears in NAD HENT: Head: NCAT.  Right Ear: External ear normal.  Left Ear: External ear normal.  Eyes: . Pupils are equal, round, and reactive to light. Conjunctivae and EOM are normal Nose: without d/c or deformity Neck: Neck supple. Gross normal ROM Cardiovascular: Normal rate and regular rhythm.   Pulmonary/Chest: Effort normal and breath sounds without rales or wheezing.  Abd:  Soft, NT, ND, + BS, no organomegaly Neurological: Pt is alert. At baseline orientation, motor grossly intact Skin: Skin is warm. No rashes, other new lesions, no LE edema,m has some left great toe nail deformity related to prior injury, as well as 1.5 cm left lateral ankle lipoma she is also concerned, has small callous to medial right great toe as well Psychiatric: Pt behavior is normal without agitation  No other exam findings Lab Results  Component Value Date   WBC 6.7 10/04/2017   HGB 12.9 10/04/2017   HCT 39.0 10/04/2017   PLT 234.0 10/04/2017   GLUCOSE 154 (H) 04/11/2018   CHOL 125 04/11/2018   TRIG 199.0 (H) 04/11/2018   HDL 36.90 (L) 04/11/2018   LDLDIRECT 83.0 03/24/2017   LDLCALC 48 04/11/2018   ALT 33 04/11/2018   AST 28 04/11/2018   NA 139 04/11/2018   K 3.9 04/11/2018   CL 103 04/11/2018    CREATININE 0.98 04/11/2018   BUN 12 04/11/2018   CO2 25 04/11/2018   TSH 1.39 10/04/2017   HGBA1C 7.1 (H) 04/11/2018   MICROALBUR <0.7 10/04/2017   POCT HgB A1C  Order: [id number]  Status:  Final result Visible to patient:  No (Not Released) Dx:  Type 2 diabetes mellitus without comp...   Ref Range & Units 10:39 68moago 869mogo [id number]  Hemoglobin A1C 4.0 - 5.6 % 6.3Abnormal   7.1High  R, CM 6.8High  R, CM 6.7 R            Assessment & Plan:

## 2018-07-10 ENCOUNTER — Encounter: Payer: Self-pay | Admitting: Registered"

## 2018-07-10 ENCOUNTER — Encounter: Payer: 59 | Attending: Internal Medicine | Admitting: Registered"

## 2018-07-10 DIAGNOSIS — E119 Type 2 diabetes mellitus without complications: Secondary | ICD-10-CM | POA: Insufficient documentation

## 2018-07-10 DIAGNOSIS — Z713 Dietary counseling and surveillance: Secondary | ICD-10-CM | POA: Diagnosis not present

## 2018-07-10 NOTE — Progress Notes (Signed)
Diabetes Self-Management Education  Visit Type: First/Initial  Appt. Start Time: 1600 Appt. End Time: 7741  07/10/2018  [name], identified by name and date of birth, is a 62 y.o. female with a diagnosis of Diabetes: Type 2.   ASSESSMENT Patient states when her A1c went up over 7% it motivated her to cut back on bread and increase activity to avoid going on medication. Pt states she has maintained an A1c in the 6's for a long time and would like to get back down to an A1c of 6% or even 5%  Patient states she eats her breakfast and dinner while watching TV, lunch will sometime eat or or will eats at her desk and work or look at her computer. Patient states she enjoys vegetables and chicken.  Pt states she enjoys kayaking, and walking but hasn't done much since knee surgery. Pt states she is slowly building back stamina.   Pt states she is emotional eater. Pt reports that she typically doesn't have a lot of stress, but when it does come it is really hard and she does want to eat. Pt states she is active with her church and at church functions people will encourage her to try their sweets. Pt states the environment at work can also be difficult with brownies and donuts. 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   Sleep: 6-7 hours. Pt reports energy good during day, but tired in evening 8-9 pm but  stays up until later. Pt reports waking up due to knees hurting,usually goes back to sleep quickly. 6:30 am wakes up.  Diabetes Self-Management Education - 07/10/18 1616      Visit Information   Visit Type  First/Initial      Initial Visit   Diabetes Type  Type 2    Are you currently following a meal plan?  No    Are you taking your medications as prescribed?  Yes    Date Diagnosed  2-3 yrs ago      Health Coping   How would you rate your overall health?  Good      Psychosocial Assessment   Patient  Belief/Attitude about Diabetes  Motivated to manage diabetes    How often do you need to have someone help you when you read instructions, pamphlets, or other written materials from your doctor or pharmacy?  1 - Never    What is the last grade level you completed in school?  4 yrs college      Complications   Last HgB A1C per patient/outside source  6.3 %    How often do you check your blood sugar?  0 times/day (not testing)    Have you had a dilated eye exam in the past 12 months?  Yes    Have you had a dental exam in the past 12 months?  Yes    Are you checking your feet?  Yes    How many days per week are you checking your feet?  7      Dietary Intake   Breakfast  fruit and sugar-free oatmeal, cinnamon, walnuts, chia &flaxseed OR 1/2 bagal OR almond milk cereal occassional    Snack (morning)  none    Lunch  salad, chicken OR chicken sandwich 1 slice of bread, vegetables OR chicken and vegetables OR chicken sand chic-fil-a    Snack (afternoon)  none OR fruit OR something sweet  at work    Arboriculturist, vegetables, bread OR tacos OR pizza   6:30-7:30 pm   Snack (evening)  tries real hard not to, low sugar popcicles    Beverage(s)  24-48 oz water, unsweet tea, coffee cream,      Exercise   Exercise Type  Light (walking / raking leaves)    How many days per week to you exercise?  5    How many minutes per day do you exercise?  30    Total minutes per week of exercise  150      Patient Education   Previous Diabetes Education  Yes (please comment)   no sure how long ago   Nutrition management   Role of diet in the treatment of diabetes and the relationship between the three main macronutrients and blood glucose level    Physical activity and exercise   Role of exercise on diabetes management, blood pressure control and cardiac health.    Psychosocial adjustment  Role of stress on diabetes      Individualized Goals (developed by patient)   Nutrition  General guidelines for  healthy choices and portions discussed;Other (comment)   mindful eating     Outcomes   Expected Outcomes  Demonstrated interest in learning. Expect positive outcomes    Future DMSE  4-6 wks    Program Status  Completed      Individualized Plan for Diabetes Self-Management Training:   Learning Objective:  Patient will have a greater understanding of diabetes self-management. Patient education plan is to attend individual and/or group sessions per assessed needs and concerns.    Patient Instructions  Aim to eat balanced meals and snacks, use MyPlate for ideas Goals for the next 6 weeks:  Walking more, 3x per day get up and walk at work; 6,000 steps  Kayaking - contact friends  Check out available yoga & tai chi  Call insurance about silver sneakers program  Mindful eating 4x week dinner meal  Consider taking this assessment before your next visit: [email or web address]  Expected Outcomes:  Demonstrated interest in learning. Expect positive outcomes  Education material provided: My Plate, sleep hygiene  If problems or questions, patient to contact team via:  Phone and MyChart  Future DSME appointment: 4-6 wks

## 2018-07-10 NOTE — Patient Instructions (Addendum)
Aim to eat balanced meals and snacks, use MyPlate for ideas Goals for the next 6 weeks:  Walking more, 3x per day get up and walk at work; 6,000 steps  Kayaking - contact friends  Check out available yoga & tai chi  Call insurance about silver sneakers program  Mindful eating 4x week dinner meal  Consider taking this assessment before your next visit: [email or web address]

## 2018-08-08 ENCOUNTER — Ambulatory Visit (INDEPENDENT_AMBULATORY_CARE_PROVIDER_SITE_OTHER): Payer: 59 | Admitting: Internal Medicine

## 2018-08-08 ENCOUNTER — Other Ambulatory Visit (INDEPENDENT_AMBULATORY_CARE_PROVIDER_SITE_OTHER): Payer: 59

## 2018-08-08 ENCOUNTER — Encounter: Payer: Self-pay | Admitting: Internal Medicine

## 2018-08-08 DIAGNOSIS — I10 Essential (primary) hypertension: Secondary | ICD-10-CM | POA: Diagnosis not present

## 2018-08-08 DIAGNOSIS — E119 Type 2 diabetes mellitus without complications: Secondary | ICD-10-CM

## 2018-08-08 DIAGNOSIS — R109 Unspecified abdominal pain: Secondary | ICD-10-CM | POA: Insufficient documentation

## 2018-08-08 DIAGNOSIS — R1030 Lower abdominal pain, unspecified: Secondary | ICD-10-CM

## 2018-08-08 DIAGNOSIS — J069 Acute upper respiratory infection, unspecified: Secondary | ICD-10-CM

## 2018-08-08 LAB — URINALYSIS, ROUTINE W REFLEX MICROSCOPIC
Bilirubin Urine: NEGATIVE
HGB URINE DIPSTICK: NEGATIVE
Ketones, ur: NEGATIVE
Nitrite: NEGATIVE
PH: 6 (ref 5.0–8.0)
RBC / HPF: NONE SEEN (ref 0–?)
TOTAL PROTEIN, URINE-UPE24: NEGATIVE
Urine Glucose: NEGATIVE
Urobilinogen, UA: 0.2 (ref 0.0–1.0)

## 2018-08-08 MED ORDER — AMOXICILLIN-POT CLAVULANATE 875-125 MG PO TABS: 1.0000 | ORAL_TABLET | Freq: Two times a day (BID) | ORAL | 0 refills | Status: DC

## 2018-08-08 NOTE — Progress Notes (Signed)
Subjective:    Patient ID: [name] [name], female    DOB: Nov 19, 1955, 62 y.o.   MRN: [id number]  HPI  Here to f/u with 1 wk of intermittent unusual chills and every other day just not feeling well, even had a significant diaphoretic episode at shopping.  No high fever she knows, but overall similar to previous UTI remotely.  Has some middle back achy discomfort, but /Denies urinary symptoms such as dysuria, frequency, urgency, flank pain, hematuria or n/v, though has had some ? Pink urine in the am, but none the rest of the week.  Pt denies chest pain, increased sob or doe, wheezing, orthopnea, PND, increased LE swelling, palpitations, dizziness or syncope.  Pt denies polydipsia, polyuria, Does have bilat max sinus inflammation and ear fullness that seems to come and go "all the time anyway."  Has good compliance with saline and dymista.  Also good compliance with trimethoprim daily.  Has lost wt intentionally with better diet.   Wt Readings from Last 3 Encounters:  08/08/18 173 lb (78.5 kg)  06/12/18 175 lb (79.4 kg)  04/12/18 181 lb (82.1 kg)   Past Medical History:  Diagnosis Date  . ALLERGIC RHINITIS 04/06/2007  . ANXIETY 04/06/2007  . Arthritis   . CIN I (cervical intraepithelial neoplasia I)   . COLONIC POLYPS 08/04/2007  . Diabetes mellitus without complication ([place])   . DIARRHEA, ACUTE 09/23/2010  . Fibroid   . GASTRIC POLYP 08/04/2007  . GERD 08/04/2007  . GOITER, MULTINODULAR 03/10/2009  . Headache(784.0) 11/13/2008  . Hx of blood clots   . HYPERLIPIDEMIA 04/06/2007  . HYPERTENSION 04/06/2007  . ISCHEMIC COLITIS, HX OF 07/06/2007  . Lichen sclerosus 6378  . Osteopenia 12/2012   T score -1.1 FRAX 5.9%/0.3%  . Palpitations 03/10/2009  . Rosacea 11/30/2007  . THYROID NODULE, LEFT 11/04/2010  . Urinary frequency   . UTI'S, RECURRENT 11/30/2007   Past Surgical History:  Procedure Laterality Date  . APPENDECTOMY  2005  . bladder tack    . COLPOSCOPY    . CYSTOSCOPY  2007  .  DERMOID CYST  EXCISION  1994   right ovary  . FLEXIBLE SIGMOIDOSCOPY  11/16/95  . Lt. pectoral/subclavicular abscess drainage  2006  . removed skull cyst  1973  . right foot neuroma  1988  . uterine fibroids removed  1996    reports that she has never smoked. She has never used smokeless tobacco. She reports that she drinks alcohol. She reports that she does not use drugs. family history includes Breast cancer in her paternal aunt; Deep vein thrombosis in her brother; Diabetes in her mother; Emphysema in her father; Heart disease in her father and mother; Hypertension in her mother; Lung cancer in her maternal grandmother, maternal uncle, and mother; Stroke in her brother and father. Allergies  Allergen Reactions  . Atorvastatin     REACTION: extreme muscle aches  . Cefuroxime Axetil     REACTION: DIARRHEA  . Ciprofloxacin     REACTION: diarrhea  . Latex     REACTION: rash  . Lipitor [Atorvastatin Calcium] Other (See Comments)    sensitive  . Moxifloxacin     REACTION: DIARRHEA   Current Outpatient Medications on File Prior to Visit  Medication Sig Dispense Refill  . aspirin 81 MG EC tablet Take 81 mg by mouth daily.      . Azelastine-Fluticasone (DYMISTA NA) Place into the nose.    . Blood Glucose Monitoring Suppl (ONE TOUCH ULTRA 2)  W/DEVICE KIT Use as directed 1 per day 1 each 0  . Cetirizine HCl (ZYRTEC PO) Take by mouth.    . Esomeprazole Magnesium (NEXIUM 24HR PO) Take by mouth daily.    Marland Kitchen GLUCOSAMINE-CHONDROITIN PO Take by mouth.    Marland Kitchen glucose blood test strip Use as instructed 100 each 12  . Lancets MISC Use as directed 1 per day 100 each 12  . levocetirizine (XYZAL) 5 MG tablet Take 5 mg by mouth every evening.    . Multiple Vitamins-Minerals (MULTIVITAMIN PO) Take by mouth daily.    . potassium chloride SA (K-DUR,KLOR-CON) 20 MEQ tablet Take 1 tablet (20 mEq total) by mouth 2 (two) times daily. 180 tablet 3  . rosuvastatin (CRESTOR) 20 MG tablet Take 1 tablet (20 mg  total) by mouth daily. 90 tablet 3  . trimethoprim (TRIMPEX) 100 MG tablet Take 100 mg by mouth 2 (two) times daily.    . valsartan-hydrochlorothiazide (DIOVAN-HCT) 320-25 MG tablet Take 1 tablet by mouth daily. 90 tablet 3   No current facility-administered medications on file prior to visit.     Review of Systems  Constitutional: Negative for other unusual diaphoresis or sweats HENT: Negative for ear discharge or swelling Eyes: Negative for other worsening visual disturbances Respiratory: Negative for stridor or other swelling  Gastrointestinal: Negative for worsening distension or other blood Genitourinary: Negative for retention or other urinary change Musculoskeletal: Negative for other MSK pain or swelling Skin: Negative for color change or other new lesions Neurological: Negative for worsening tremors and other numbness  Psychiatric/Behavioral: Negative for worsening agitation or other fatigue All other system neg per pt    Objective:   Physical Exam BP 122/74   Pulse 83   Temp 98.4 F (36.9 C) (Oral)   Ht 5' 1.5" (1.562 m)   Wt 173 lb (78.5 kg)   SpO2 95%   BMI 32.16 kg/m  VS noted, mild ill Constitutional: Pt appears in NAD HENT: Head: NCAT.  Right Ear: External ear normal.  Left Ear: External ear normal.  Bilat tm's with mild erythema.  Max sinus areas mild tender.  Pharynx with mild erythema, no exudate Eyes: . Pupils are equal, round, and reactive to light. Conjunctivae and EOM are normal Nose: without d/c or deformity Neck: Neck supple. Gross normal ROM Cardiovascular: Normal rate and regular rhythm.   Pulmonary/Chest: Effort normal and breath sounds without rales or wheezing.  Abd:  Soft,  ND, + BS, no organomegaly, with mild discomfort to lower mid abd, and trace left flank tender Neurological: Pt is alert. At baseline orientation, motor grossly intact Skin: Skin is warm. No rashes, other new lesions, no LE edema Psychiatric: Pt behavior is normal without  agitation  No other exam findings Lab Results  Component Value Date   WBC 6.7 10/04/2017   HGB 12.9 10/04/2017   HCT 39.0 10/04/2017   PLT 234.0 10/04/2017   GLUCOSE 154 (H) 04/11/2018   CHOL 125 04/11/2018   TRIG 199.0 (H) 04/11/2018   HDL 36.90 (L) 04/11/2018   LDLDIRECT 83.0 03/24/2017   LDLCALC 48 04/11/2018   ALT 33 04/11/2018   AST 28 04/11/2018   NA 139 04/11/2018   K 3.9 04/11/2018   CL 103 04/11/2018   CREATININE 0.98 04/11/2018   BUN 12 04/11/2018   CO2 25 04/11/2018   TSH 1.39 10/04/2017   HGBA1C 6.3 (A) 06/12/2018   MICROALBUR <0.7 10/04/2017       Assessment & Plan:

## 2018-08-08 NOTE — Patient Instructions (Signed)
Please take all new medication as prescribed - the augmentin antibiotic  Please continue all other medications as before, and refills have been done if requested.  Please have the pharmacy call with any other refills you may need.  Please continue your efforts at being more active, low cholesterol diet, and weight control.  You are otherwise up to date with prevention measures today.  Please keep your appointments with your specialists as you may have planned  Please go to the LAB in the Basement (turn left off the elevator) for the tests to be done today - just the urine testing today  You will be contacted by phone if any changes need to be made immediately.  Otherwise, you will receive a letter about your results with an explanation, but please check with MyChart first.  Please remember to sign up for MyChart if you have not done so, as this will be important to you in the future with finding out test results, communicating by private email, and scheduling acute appointments online when needed.

## 2018-08-08 NOTE — Assessment & Plan Note (Signed)
stable overall by history and exam, recent data reviewed with pt, and pt to continue medical treatment as before,  to f/u any worsening symptoms or concerns  

## 2018-08-08 NOTE — Assessment & Plan Note (Signed)
Vs allergies, Mild, for antibx course,  to f/u any worsening symptoms or concerns

## 2018-08-08 NOTE — Assessment & Plan Note (Signed)
Suspect UTI is more likely cause of her presenting symptoms, has enough to warrant start augmentin especially after similar vague presentation to a severe infection a few yrs ago; for urine studies,  to f/u any worsening symptoms or concerns

## 2018-08-09 ENCOUNTER — Other Ambulatory Visit: Payer: Self-pay | Admitting: Internal Medicine

## 2018-08-09 DIAGNOSIS — Z1231 Encounter for screening mammogram for malignant neoplasm of breast: Secondary | ICD-10-CM

## 2018-08-09 LAB — URINE CULTURE
MICRO NUMBER:: 91392491
SPECIMEN QUALITY:: ADEQUATE

## 2018-08-11 ENCOUNTER — Encounter: Payer: Self-pay | Admitting: Gynecology

## 2018-08-11 ENCOUNTER — Ambulatory Visit (INDEPENDENT_AMBULATORY_CARE_PROVIDER_SITE_OTHER): Payer: 59 | Admitting: Gynecology

## 2018-08-11 VITALS — BP 118/78 | Ht 61.5 in | Wt 173.0 lb

## 2018-08-11 DIAGNOSIS — N952 Postmenopausal atrophic vaginitis: Secondary | ICD-10-CM | POA: Diagnosis not present

## 2018-08-11 DIAGNOSIS — L280 Lichen simplex chronicus: Secondary | ICD-10-CM

## 2018-08-11 DIAGNOSIS — Z01419 Encounter for gynecological examination (general) (routine) without abnormal findings: Secondary | ICD-10-CM

## 2018-08-11 DIAGNOSIS — M8580 Other specified disorders of bone density and structure, unspecified site: Secondary | ICD-10-CM | POA: Diagnosis not present

## 2018-08-11 NOTE — Patient Instructions (Signed)
Followup for bone density as scheduled. 

## 2018-08-11 NOTE — Progress Notes (Signed)
    [name] [name] [birth year] [id number]        62 y.o.  G0P0 for annual gynecologic exam.  Several issues noted below.  Past medical history,surgical history, problem list, medications, allergies, family history and social history were all reviewed and documented as reviewed in the EPIC chart.  ROS:  Performed with pertinent positives and negatives included in the history, assessment and plan.   Additional significant findings : None   Exam: Caryn Bee assistant Vitals:   08/11/18 1103  BP: 118/78  Weight: 173 lb (78.5 kg)  Height: 5' 1.5" (1.562 m)   Body mass index is 32.16 kg/m.  General appearance:  Normal affect, orientation and appearance. Skin: Grossly normal HEENT: Without gross lesions.  No cervical or supraclavicular adenopathy. Thyroid normal.  Lungs:  Clear without wheezing, rales or rhonchi Cardiac: RR, without RMG Abdominal:  Soft, nontender, without masses, guarding, rebound, organomegaly or hernia Breasts:  Examined lying and sitting without masses, retractions, discharge or axillary adenopathy. Pelvic:  Ext, BUS, Vagina: With atrophic changes.  Mild blanching of the skin mid lower perineal body consistent with her history of lichen simplex chronicus.  Cervix: With atrophic changes  Uterus: Anteverted, normal size, shape and contour, midline and mobile nontender   Adnexa: Without masses or tenderness    Anus and perineum: Normal   Rectovaginal: Normal sphincter tone without palpated masses or tenderness.    Assessment/Plan:  62 y.o. G0P0 female for annual gynecologic exam.   1. Postmenopausal/atrophic changes.  No significant menopausal symptoms or any vaginal bleeding. 2. Lichen simplex chronicus.  Patient has a chronic irritative area perineal body biopsied showing lichen simplex chronicus.  Had used Temovate cream in the past but finds that just using OTC Tucks type pads with a stringent is more effective.  Exam shows some mild changes.  No concerning  is/lesions.  Patient will continue with the above and call if she wants a refill on the Temovate. 3. Osteopenia.  DEXA 2014 T score -1.1.  FRAX 6% / 0.3%.  Schedule DEXA now at 5-year interval and patient will follow-up for this. 4. Colonoscopy 2018.  Repeat at their recommended interval. 5. Pap smear/HPV 08/2014.  No Pap smear done today.  History of CIN-1 1994 with normal Paps since.  Plan repeat Pap smear/HPV next year at 5-year interval. 6. Mammography 09/2017.  Reminded patient she is coming due and schedule in January.  Breast exam normal today. 7. Health maintenance.  No routine lab work done as patient does this elsewhere.  Follow-up 1 year, sooner as needed.   Anastasio Auerbach MD, 11:27 AM 08/11/2018

## 2018-08-15 LAB — HM DIABETES EYE EXAM

## 2018-08-22 ENCOUNTER — Encounter: Payer: 59 | Attending: Internal Medicine | Admitting: Registered"

## 2018-08-22 DIAGNOSIS — Z713 Dietary counseling and surveillance: Secondary | ICD-10-CM | POA: Insufficient documentation

## 2018-08-22 DIAGNOSIS — E119 Type 2 diabetes mellitus without complications: Secondary | ICD-10-CM | POA: Diagnosis not present

## 2018-08-22 NOTE — Progress Notes (Signed)
Diabetes Self-Management Education  Visit Type: Follow-up  Appt. Start Time: 1610 Appt. End Time: 1640  08/22/2018  [name], identified by name and date of birth, is a 62 y.o. female with a diagnosis of Diabetes: Type 2.   ASSESSMENT Patient has followed through will all goals set last visit. Pt states all the senior programs she found were offered during day and did not fit her schedule. Pt states she will just continue walking. Pt states she has been getting 6K during weekdays, but eases up on the weekends. Pt states she is also starting to wind down earlier at night and going to bed closer to 10 pm instead of 11. Pt states she has be doing more mindful eating and finds that she tastes her food more now and feels more satisfied.   Pt states she does not crave sweet drinks any more and tried a small sweet tea at Thanksgiving and did not like it. Pt states she is drinking more water now.   Pt had questions about potential supplements that may help bring down A1c. Pt feels she has the education she needs to continue managing her blood sugar.  Diabetes Self-Management Education - 08/22/18 1614      Visit Information   Visit Type  Follow-up      Initial Visit   Diabetes Type  Type 2      Dietary Intake   Breakfast  2 slices chicken, cucumbers, cherry tomatoes, coffee    Snack (morning)  none    Lunch  [employer] roast beef part of bun, apple, clementine    Snack (afternoon)  none    Dinner  dressing, nuts, deli chicken    Snack (evening)  none    Beverage(s)  coffee, tea, water      Exercise   Exercise Type  Light (walking / raking leaves)      Patient Education   Nutrition management   Role of diet in the treatment of diabetes and the relationship between the three main macronutrients and blood glucose level    Physical activity and exercise   Role of exercise on diabetes management, blood pressure control and cardiac health.      Individualized Goals (developed by  patient)   Nutrition  General guidelines for healthy choices and portions discussed    Physical Activity  Exercise 3-5 times per week      Patient Self-Evaluation of Goals - Patient rates self as meeting previously set goals (% of time)   Nutrition  >75%    Physical Activity  50 - 75 %      Outcomes   Expected Outcomes  Demonstrated interest in learning. Expect positive outcomes    Future DMSE  PRN    Program Status  Completed      Individualized Plan for Diabetes Self-Management Training:   Learning Objective:  Patient will have a greater understanding of diabetes self-management. Patient education plan is to attend individual and/or group sessions per assessed needs and concerns.  Patient Instructions  Continue getting to bed earlier and getting good sleep. Exercise: 4-5x week 6,000 steps, continue to do at moderate rate Protein whenever eating carbs Great job on weaning off sweet drinks Continue practicing mindful eating Continue having walnuts especially with oatmeal. Fish/seafood 2-3x per week  Expected Outcomes:  Demonstrated interest in learning. Expect positive outcomes  Education material provided: ovasitol supplement brochure  If problems or questions, patient to contact team via:  Phone and MyChart  Future DSME appointment:  PRN

## 2018-08-22 NOTE — Patient Instructions (Signed)
Continue getting to bed earlier and getting good sleep. Exercise: 4-5x week 6,000 steps, continue to do at moderate rate Protein whenever eating carbs Great job on weaning off sweet drinks Continue practicing mindful eating Continue having walnuts especially with oatmeal. Fish/seafood 2-3x per week

## 2018-09-01 ENCOUNTER — Other Ambulatory Visit: Payer: Self-pay | Admitting: Internal Medicine

## 2018-09-04 ENCOUNTER — Other Ambulatory Visit: Payer: Self-pay | Admitting: Surgery

## 2018-09-04 DIAGNOSIS — E042 Nontoxic multinodular goiter: Secondary | ICD-10-CM

## 2018-09-06 ENCOUNTER — Ambulatory Visit (INDEPENDENT_AMBULATORY_CARE_PROVIDER_SITE_OTHER): Payer: 59

## 2018-09-06 ENCOUNTER — Encounter: Payer: Self-pay | Admitting: Gynecology

## 2018-09-06 DIAGNOSIS — Z13820 Encounter for screening for osteoporosis: Secondary | ICD-10-CM

## 2018-09-06 DIAGNOSIS — M8588 Other specified disorders of bone density and structure, other site: Secondary | ICD-10-CM | POA: Diagnosis not present

## 2018-09-06 DIAGNOSIS — M85851 Other specified disorders of bone density and structure, right thigh: Secondary | ICD-10-CM

## 2018-09-06 DIAGNOSIS — M8580 Other specified disorders of bone density and structure, unspecified site: Secondary | ICD-10-CM

## 2018-09-07 ENCOUNTER — Other Ambulatory Visit: Payer: Self-pay | Admitting: Gynecology

## 2018-09-07 DIAGNOSIS — Z13820 Encounter for screening for osteoporosis: Secondary | ICD-10-CM

## 2018-09-07 DIAGNOSIS — M85851 Other specified disorders of bone density and structure, right thigh: Secondary | ICD-10-CM

## 2018-09-08 ENCOUNTER — Ambulatory Visit
Admission: RE | Admit: 2018-09-08 | Discharge: 2018-09-08 | Disposition: A | Discharge status: Home / Self Care | Payer: 59 | Source: Ambulatory Visit | Attending: Surgery | Admitting: Surgery

## 2018-09-08 DIAGNOSIS — E042 Nontoxic multinodular goiter: Secondary | ICD-10-CM | POA: Diagnosis not present

## 2018-09-29 ENCOUNTER — Ambulatory Visit: Payer: 59

## 2018-10-05 ENCOUNTER — Ambulatory Visit
Admission: RE | Admit: 2018-10-05 | Discharge: 2018-10-05 | Disposition: A | Discharge status: Home / Self Care | Payer: 59 | Source: Ambulatory Visit | Attending: Internal Medicine | Admitting: Internal Medicine

## 2018-10-05 DIAGNOSIS — Z1231 Encounter for screening mammogram for malignant neoplasm of breast: Secondary | ICD-10-CM | POA: Diagnosis not present

## 2018-10-11 ENCOUNTER — Other Ambulatory Visit (INDEPENDENT_AMBULATORY_CARE_PROVIDER_SITE_OTHER): Payer: 59

## 2018-10-11 DIAGNOSIS — E119 Type 2 diabetes mellitus without complications: Secondary | ICD-10-CM | POA: Diagnosis not present

## 2018-10-11 LAB — LIPID PANEL
Cholesterol: 207 mg/dL — ABNORMAL HIGH (ref 0–200)
HDL: 42.2 mg/dL (ref >39.00)
LDL Cholesterol: 133 mg/dL — ABNORMAL HIGH (ref 0–99)
NonHDL: 164.42
Total CHOL/HDL Ratio: 5
Triglycerides: 158 mg/dL — ABNORMAL HIGH (ref 0.0–149.0)
VLDL: 31.6 mg/dL (ref 0.0–40.0)

## 2018-10-11 LAB — CBC WITH DIFFERENTIAL/PLATELET
BASOS PCT: 1.2 % (ref 0.0–3.0)
Basophils Absolute: 0.1 10*3/uL (ref 0.0–0.1)
Eosinophils Absolute: 0.3 10*3/uL (ref 0.0–0.7)
Eosinophils Relative: 4.2 % (ref 0.0–5.0)
HCT: 39.5 % (ref 36.0–46.0)
Hemoglobin: 13.2 g/dL (ref 12.0–15.0)
Lymphocytes Relative: 38.3 % (ref 12.0–46.0)
Lymphs Abs: 2.4 10*3/uL (ref 0.7–4.0)
MCHC: 33.4 g/dL (ref 30.0–36.0)
MCV: 82.4 fl (ref 78.0–100.0)
Monocytes Absolute: 0.5 10*3/uL (ref 0.1–1.0)
Monocytes Relative: 8.8 % (ref 3.0–12.0)
Neutro Abs: 3 10*3/uL (ref 1.4–7.7)
Neutrophils Relative %: 47.5 % (ref 43.0–77.0)
PLATELETS: 225 10*3/uL (ref 150.0–400.0)
RBC: 4.79 Mil/uL (ref 3.87–5.11)
RDW: 13.6 % (ref 11.5–15.5)
WBC: 6.2 10*3/uL (ref 4.0–10.5)

## 2018-10-11 LAB — URINALYSIS, ROUTINE W REFLEX MICROSCOPIC
Bilirubin Urine: NEGATIVE
Hgb urine dipstick: NEGATIVE
Ketones, ur: NEGATIVE
LEUKOCYTES UA: NEGATIVE
Nitrite: NEGATIVE
RBC / HPF: NONE SEEN (ref 0–?)
Specific Gravity, Urine: 1.01 (ref 1.000–1.030)
Total Protein, Urine: NEGATIVE
Urine Glucose: NEGATIVE
Urobilinogen, UA: 0.2 (ref 0.0–1.0)
pH: 6 (ref 5.0–8.0)

## 2018-10-11 LAB — HEPATIC FUNCTION PANEL
ALT: 31 U/L (ref 0–35)
AST: 26 U/L (ref 0–37)
Albumin: 4.5 g/dL (ref 3.5–5.2)
Alkaline Phosphatase: 83 U/L (ref 39–117)
BILIRUBIN TOTAL: 0.4 mg/dL (ref 0.2–1.2)
Bilirubin, Direct: 0 mg/dL (ref 0.0–0.3)
Total Protein: 7.3 g/dL (ref 6.0–8.3)

## 2018-10-11 LAB — BASIC METABOLIC PANEL
BUN: 14 mg/dL (ref 6–23)
CO2: 26 mEq/L (ref 19–32)
Calcium: 9.8 mg/dL (ref 8.4–10.5)
Chloride: 101 mEq/L (ref 96–112)
Creatinine, Ser: 0.72 mg/dL (ref 0.40–1.20)
GFR: 81.91 mL/min (ref >60.00)
Glucose, Bld: 118 mg/dL — ABNORMAL HIGH (ref 70–99)
Potassium: 3.7 mEq/L (ref 3.5–5.1)
Sodium: 137 mEq/L (ref 135–145)

## 2018-10-11 LAB — MICROALBUMIN / CREATININE URINE RATIO
Creatinine,U: 59.4 mg/dL
MICROALB/CREAT RATIO: 1.2 mg/g (ref 0.0–30.0)
Microalb, Ur: <0.7 mg/dL (ref 0.0–1.9)

## 2018-10-11 LAB — HEMOGLOBIN A1C: Hgb A1c MFr Bld: 6.5 % (ref 4.6–6.5)

## 2018-10-11 LAB — TSH: TSH: 1.14 u[IU]/mL (ref 0.35–4.50)

## 2018-10-13 ENCOUNTER — Ambulatory Visit: Payer: 59 | Admitting: Internal Medicine

## 2018-10-13 ENCOUNTER — Encounter: Payer: Self-pay | Admitting: Internal Medicine

## 2018-10-13 VITALS — BP 122/78 | HR 85 | Temp 98.3°F | Ht 61.5 in | Wt 171.0 lb

## 2018-10-13 DIAGNOSIS — Z0000 Encounter for general adult medical examination without abnormal findings: Secondary | ICD-10-CM | POA: Diagnosis not present

## 2018-10-13 DIAGNOSIS — E785 Hyperlipidemia, unspecified: Secondary | ICD-10-CM | POA: Diagnosis not present

## 2018-10-13 DIAGNOSIS — E119 Type 2 diabetes mellitus without complications: Secondary | ICD-10-CM

## 2018-10-13 NOTE — Progress Notes (Signed)
Subjective:    Patient ID: [name] [name], female    DOB: Aug 12, 1956, 63 y.o.   MRN: [id number]  HPI  Here for wellness and f/u;  Overall doing ok;  Pt denies Chest pain, worsening SOB, DOE, wheezing, orthopnea, PND, worsening LE edema, palpitations, dizziness or syncope.  Pt denies neurological change such as new headache, facial or extremity weakness.  Pt denies polydipsia, polyuria, or low sugar symptoms. Pt states overall good compliance with treatment and medications, good tolerability, and has been trying to follow appropriate diet.  Pt denies worsening depressive symptoms, suicidal ideation or panic. No fever, night sweats, wt loss, loss of appetite, or other constitutional symptoms.  Pt states good ability with ADL's, has low fall risk, home safety reviewed and adequate, no other significant changes in hearing or vision, and only occasionally active with exercise. No other exam findings. BP Readings from Last 3 Encounters:  10/13/18 122/78  08/11/18 118/78  08/08/18 122/74   Wt Readings from Last 3 Encounters:  10/13/18 171 lb (77.6 kg)  08/11/18 173 lb (78.5 kg)  08/08/18 173 lb (78.5 kg)   Past Medical History:  Diagnosis Date  . ALLERGIC RHINITIS 04/06/2007  . ANXIETY 04/06/2007  . Arthritis   . Cataract   . CIN I (cervical intraepithelial neoplasia I)   . COLONIC POLYPS 08/04/2007  . Diabetes mellitus without complication ([place])   . DIARRHEA, ACUTE 09/23/2010  . Fibroid   . GASTRIC POLYP 08/04/2007  . GERD 08/04/2007  . GOITER, MULTINODULAR 03/10/2009  . Headache(784.0) 11/13/2008  . Hx of blood clots   . HYPERLIPIDEMIA 04/06/2007  . HYPERTENSION 04/06/2007  . ISCHEMIC COLITIS, HX OF 07/06/2007  . Lichen sclerosus 1224  . Osteopenia 08/2018   T score -1.1 FRAX 7% / 0.5%  . Palpitations 03/10/2009  . Rosacea 11/30/2007  . THYROID NODULE, LEFT 11/04/2010  . Urinary frequency   . UTI'S, RECURRENT 11/30/2007   Past Surgical History:  Procedure Laterality Date  .  APPENDECTOMY  2005  . bladder tack    . COLPOSCOPY    . CYSTOSCOPY  2007  . DERMOID CYST  EXCISION  1994   right ovary  . FLEXIBLE SIGMOIDOSCOPY  11/16/95  . Lt. pectoral/subclavicular abscess drainage  2006  . removed skull cyst  1973  . right foot neuroma  1988  . uterine fibroids removed  1996    reports that she has never smoked. She has never used smokeless tobacco. She reports current alcohol use. She reports that she does not use drugs. family history includes Breast cancer in her paternal aunt; Deep vein thrombosis in her brother; Diabetes in her mother; Emphysema in her father; Heart disease in her father and mother; Hypertension in her mother; Lung cancer in her maternal grandmother, maternal uncle, and mother; Macular degeneration in her brother; Stroke in her brother and father. Allergies  Allergen Reactions  . Atorvastatin     REACTION: extreme muscle aches  . Cefuroxime Axetil     REACTION: DIARRHEA  . Ciprofloxacin     REACTION: diarrhea  . Latex     REACTION: rash  . Lipitor [Atorvastatin Calcium] Other (See Comments)    sensitive  . Moxifloxacin     REACTION: DIARRHEA   Current Outpatient Medications on File Prior to Visit  Medication Sig Dispense Refill  . amoxicillin-clavulanate (AUGMENTIN) 875-125 MG tablet Take 1 tablet by mouth 2 (two) times daily. 20 tablet 0  . aspirin 81 MG EC tablet Take 81 mg by mouth  daily.      . Azelastine-Fluticasone (DYMISTA NA) Place into the nose.    . Blood Glucose Monitoring Suppl (ONE TOUCH ULTRA 2) W/DEVICE KIT Use as directed 1 per day 1 each 0  . Cetirizine HCl (ZYRTEC PO) Take by mouth.    Marland Kitchen GLUCOSAMINE-CHONDROITIN PO Take by mouth.    Marland Kitchen glucose blood test strip Use as instructed 100 each 12  . Lancets MISC Use as directed 1 per day 100 each 12  . Multiple Vitamins-Minerals (MULTIVITAMIN PO) Take by mouth daily.    Marland Kitchen omeprazole (PRILOSEC) 20 MG capsule Take 20 mg by mouth daily.    . potassium chloride SA  (K-DUR,KLOR-CON) 20 MEQ tablet TAKE 1 TABLET BY MOUTH TWO  TIMES DAILY 180 tablet 3  . rosuvastatin (CRESTOR) 20 MG tablet TAKE 1 TABLET BY MOUTH  DAILY 90 tablet 3  . trimethoprim (TRIMPEX) 100 MG tablet Take 100 mg by mouth 2 (two) times daily.    . valsartan-hydrochlorothiazide (DIOVAN-HCT) 320-25 MG tablet TAKE 1 TABLET BY MOUTH  DAILY 90 tablet 3   No current facility-administered medications on file prior to visit.    Review of Systems Constitutional: Negative for other unusual diaphoresis, sweats, appetite or weight changes HENT: Negative for other worsening hearing loss, ear pain, facial swelling, mouth sores or neck stiffness.   Eyes: Negative for other worsening pain, redness or other visual disturbance.  Respiratory: Negative for other stridor or swelling Cardiovascular: Negative for other palpitations or other chest pain  Gastrointestinal: Negative for worsening diarrhea or loose stools, blood in stool, distention or other pain Genitourinary: Negative for hematuria, flank pain or other change in urine volume.  Musculoskeletal: Negative for myalgias or other joint swelling.  Skin: Negative for other color change, or other wound or worsening drainage.  Neurological: Negative for other syncope or numbness. Hematological: Negative for other adenopathy or swelling Psychiatric/Behavioral: Negative for hallucinations, other worsening agitation, SI, self-injury, or new decreased concentration\All other system neg per pt    Objective:   Physical Exam BP 122/78   Pulse 85   Temp 98.3 F (36.8 C) (Oral)   Ht 5' 1.5" (1.562 m)   Wt 171 lb (77.6 kg)   SpO2 97%   BMI 31.79 kg/m  VS noted,  Constitutional: Pt is oriented to person, place, and time. Appears well-developed and well-nourished, in no significant distress and comfortable Head: Normocephalic and atraumatic  Eyes: Conjunctivae and EOM are normal. Pupils are equal, round, and reactive to light Right Ear: External ear normal  without discharge Left Ear: External ear normal without discharge Nose: Nose without discharge or deformity Mouth/Throat: Oropharynx is without other ulcerations and moist  Neck: Normal range of motion. Neck supple. No JVD present. No tracheal deviation present or significant neck LA or mass Cardiovascular: Normal rate, regular rhythm, normal heart sounds and intact distal pulses. Pulmonary/Chest: WOB normal and breath sounds without rales or wheezing  Abdominal: Soft. Bowel sounds are normal. NT. No HSM  Musculoskeletal: Normal range of motion. Exhibits no edema Lymphadenopathy: Has no other cervical adenopathy.  Neurological: Pt is alert and oriented to person, place, and time. Pt has normal reflexes. No cranial nerve deficit. Motor grossly intact, Gait intact Skin: Skin is warm and dry. No rash noted or new ulcerations Psychiatric:  Has normal mood and affect. Behavior is normal without agitation\ No other exam findings Lab Results  Component Value Date   WBC 6.2 10/11/2018   HGB 13.2 10/11/2018   HCT 39.5 10/11/2018  PLT 225.0 10/11/2018   GLUCOSE 118 (H) 10/11/2018   CHOL 207 (H) 10/11/2018   TRIG 158.0 (H) 10/11/2018   HDL 42.20 10/11/2018   LDLDIRECT 83.0 03/24/2017   LDLCALC 133 (H) 10/11/2018   ALT 31 10/11/2018   AST 26 10/11/2018   NA 137 10/11/2018   K 3.7 10/11/2018   CL 101 10/11/2018   CREATININE 0.72 10/11/2018   BUN 14 10/11/2018   CO2 26 10/11/2018   TSH 1.14 10/11/2018   HGBA1C 6.5 10/11/2018   MICROALBUR <0.7 10/11/2018         Assessment & Plan:

## 2018-10-13 NOTE — Patient Instructions (Signed)
Please continue all other medications as before, and refills have been done if requested.  Please have the pharmacy call with any other refills you may need.  Please continue your efforts at being more active, low cholesterol diet, and weight control.  You are otherwise up to date with prevention measures today.  Please keep your appointments with your specialists as you may have planned  Please return in 6 months, or sooner if needed, with Lab testing done 3-5 days before  

## 2018-10-14 ENCOUNTER — Encounter: Payer: Self-pay | Admitting: Internal Medicine

## 2018-10-14 NOTE — Assessment & Plan Note (Signed)

## 2018-10-14 NOTE — Assessment & Plan Note (Signed)
stable overall by history and exam, recent data reviewed with pt, and pt to continue medical treatment as before,  to f/u any worsening symptoms or concerns  

## 2018-10-14 NOTE — Assessment & Plan Note (Signed)
Cont crestor 20 qd, declnies increase to 40, for lower chol diet

## 2018-10-17 DIAGNOSIS — E042 Nontoxic multinodular goiter: Secondary | ICD-10-CM | POA: Diagnosis not present

## 2018-10-18 DIAGNOSIS — E042 Nontoxic multinodular goiter: Secondary | ICD-10-CM | POA: Diagnosis not present

## 2018-10-31 ENCOUNTER — Telehealth: Payer: Self-pay | Admitting: Internal Medicine

## 2018-10-31 NOTE — Telephone Encounter (Signed)
Copied from [id number] [phone]. Topic: General - Other >> Oct 31, 2018  3:05 PM Keene Breath wrote: Reason for CRM: Patient called because she is having a problem getting her medication, valsartan-hydrochlorothiazide (DIOVAN-HCT) 320-25 MG tablet, from her pharmacy.  [facility] said they are out of it and it should be sent to a local pharmacy.  The suggested that the patient order the meds separately or ask doctor could he recommend a different medication.  Please advise and call patient back at [phone]

## 2018-11-01 MED ORDER — LOSARTAN POTASSIUM 100 MG PO TABS: 100.0000 mg | ORAL_TABLET | Freq: Every day | ORAL | 3 refills | Status: DC

## 2018-11-01 MED ORDER — HYDROCHLOROTHIAZIDE 25 MG PO TABS: 25.0000 mg | ORAL_TABLET | Freq: Every day | ORAL | 3 refills | Status: DC

## 2018-11-01 NOTE — Telephone Encounter (Signed)
Ok to change to losartan 100 and hct 25 qd due to back order status of the valsartan HCT

## 2018-11-01 NOTE — Addendum Note (Signed)
Addended by: Biagio Borg on: 11/01/2018 12:57 PM   Modules accepted: Orders

## 2018-11-30 DIAGNOSIS — J300 Vasomotor rhinitis: Secondary | ICD-10-CM | POA: Diagnosis not present

## 2018-11-30 DIAGNOSIS — H1045 Other chronic allergic conjunctivitis: Secondary | ICD-10-CM | POA: Diagnosis not present

## 2018-12-14 ENCOUNTER — Telehealth: Payer: Self-pay | Admitting: Internal Medicine

## 2018-12-14 DIAGNOSIS — R399 Unspecified symptoms and signs involving the genitourinary system: Secondary | ICD-10-CM

## 2018-12-14 NOTE — Addendum Note (Signed)
Addended by: Biagio Borg on: 12/14/2018 12:15 PM   Modules accepted: Orders

## 2018-12-14 NOTE — Telephone Encounter (Signed)
Advised patient of dr Gwynn Burly note/instructions---she will come to lab

## 2018-12-14 NOTE — Telephone Encounter (Signed)
Please ask pt to come to LAB only for UA and cx, as there are many patients convinced about a possible UTI that do not turn out to have infection, but some other cause for the symptoms, and antibiotics can be harmful in some cases  I will place order

## 2018-12-14 NOTE — Telephone Encounter (Signed)
Copied from [place] [phone]. Topic: Quick Communication - See Telephone Encounter >> Dec 14, 2018 11:42 AM Reyne Dumas L wrote: CRM for notification. See Telephone encounter for: 12/14/18.  Pt called and left voicemail on [facility] mailbox 12/13/2018.  States that she needs Dr. Jenny Reichmann to call in something for a UTI to [facility].  Pt states she is having dry heaves and chills. Pt left call back number of [phone].

## 2018-12-14 NOTE — Telephone Encounter (Signed)
See below , thanks

## 2018-12-15 NOTE — Telephone Encounter (Signed)
Noted  

## 2019-04-04 ENCOUNTER — Other Ambulatory Visit (INDEPENDENT_AMBULATORY_CARE_PROVIDER_SITE_OTHER): Payer: 59

## 2019-04-04 DIAGNOSIS — E119 Type 2 diabetes mellitus without complications: Secondary | ICD-10-CM

## 2019-04-04 DIAGNOSIS — R399 Unspecified symptoms and signs involving the genitourinary system: Secondary | ICD-10-CM

## 2019-04-04 LAB — BASIC METABOLIC PANEL
BUN: 12 mg/dL (ref 6–23)
CO2: 27 mEq/L (ref 19–32)
Calcium: 9.5 mg/dL (ref 8.4–10.5)
Chloride: 100 mEq/L (ref 96–112)
Creatinine, Ser: 0.85 mg/dL (ref 0.40–1.20)
GFR: 67.52 mL/min (ref >60.00)
Glucose, Bld: 104 mg/dL — ABNORMAL HIGH (ref 70–99)
Potassium: 3.8 mEq/L (ref 3.5–5.1)
Sodium: 137 mEq/L (ref 135–145)

## 2019-04-04 LAB — HEPATIC FUNCTION PANEL
ALT: 20 U/L (ref 0–35)
AST: 19 U/L (ref 0–37)
Albumin: 4.8 g/dL (ref 3.5–5.2)
Alkaline Phosphatase: 88 U/L (ref 39–117)
Bilirubin, Direct: 0.1 mg/dL (ref 0.0–0.3)
Total Bilirubin: 0.6 mg/dL (ref 0.2–1.2)
Total Protein: 7.5 g/dL (ref 6.0–8.3)

## 2019-04-04 LAB — LIPID PANEL
Cholesterol: 135 mg/dL (ref 0–200)
HDL: 43.5 mg/dL (ref >39.00)
LDL Cholesterol: 60 mg/dL (ref 0–99)
NonHDL: 91.37
Total CHOL/HDL Ratio: 3
Triglycerides: 155 mg/dL — ABNORMAL HIGH (ref 0.0–149.0)
VLDL: 31 mg/dL (ref 0.0–40.0)

## 2019-04-04 LAB — HEMOGLOBIN A1C: Hgb A1c MFr Bld: 6.3 % (ref 4.6–6.5)

## 2019-04-06 LAB — URINE CULTURE
MICRO NUMBER:: 669789
Result:: NO GROWTH
SPECIMEN QUALITY:: ADEQUATE

## 2019-04-13 ENCOUNTER — Other Ambulatory Visit (INDEPENDENT_AMBULATORY_CARE_PROVIDER_SITE_OTHER): Payer: 59

## 2019-04-13 ENCOUNTER — Other Ambulatory Visit: Payer: Self-pay

## 2019-04-13 ENCOUNTER — Encounter: Payer: Self-pay | Admitting: Internal Medicine

## 2019-04-13 ENCOUNTER — Ambulatory Visit (INDEPENDENT_AMBULATORY_CARE_PROVIDER_SITE_OTHER): Payer: 59 | Admitting: Internal Medicine

## 2019-04-13 VITALS — BP 134/86 | HR 71 | Temp 98.4°F | Ht 61.5 in | Wt 166.0 lb

## 2019-04-13 DIAGNOSIS — E119 Type 2 diabetes mellitus without complications: Secondary | ICD-10-CM | POA: Diagnosis not present

## 2019-04-13 DIAGNOSIS — E538 Deficiency of other specified B group vitamins: Secondary | ICD-10-CM | POA: Diagnosis not present

## 2019-04-13 DIAGNOSIS — I10 Essential (primary) hypertension: Secondary | ICD-10-CM

## 2019-04-13 DIAGNOSIS — E559 Vitamin D deficiency, unspecified: Secondary | ICD-10-CM

## 2019-04-13 DIAGNOSIS — E611 Iron deficiency: Secondary | ICD-10-CM | POA: Diagnosis not present

## 2019-04-13 DIAGNOSIS — E785 Hyperlipidemia, unspecified: Secondary | ICD-10-CM | POA: Diagnosis not present

## 2019-04-13 DIAGNOSIS — Z0000 Encounter for general adult medical examination without abnormal findings: Secondary | ICD-10-CM

## 2019-04-13 LAB — IBC PANEL
Iron: 53 ug/dL (ref 42–145)
Saturation Ratios: 12.7 % — ABNORMAL LOW (ref 20.0–50.0)
Transferrin: 298 mg/dL (ref 212.0–360.0)

## 2019-04-13 LAB — VITAMIN B12: Vitamin B-12: 249 pg/mL (ref 211–911)

## 2019-04-13 LAB — VITAMIN D 25 HYDROXY (VIT D DEFICIENCY, FRACTURES): VITD: 51.93 ng/mL (ref 30.00–100.00)

## 2019-04-13 NOTE — Patient Instructions (Signed)
You will be contacted regarding the referral for: CT cardiac score test  Please continue all other medications as before, and refills have been done if requested.  Please have the pharmacy call with any other refills you may need.  Please continue your efforts at being more active, low cholesterol diet, and weight control.  Please keep your appointments with your specialists as you may have planned  Please go to the LAB in the Basement (turn left off the elevator) for the tests to be done today  You will be contacted by phone if any changes need to be made immediately.  Otherwise, you will receive a letter about your results with an explanation, but please check with MyChart first.  Please remember to sign up for MyChart if you have not done so, as this will be important to you in the future with finding out test results, communicating by private email, and scheduling acute appointments online when needed.  Please return in 6 months, or sooner if needed, with Lab testing done 3-5 days before

## 2019-04-13 NOTE — Progress Notes (Signed)
 Subjective:    Patient ID: [name] [name], female    DOB: 02/01/1956, 63 y.o.   MRN: [id number]  HPI  Here to f/u; overall doing ok,  Pt denies chest pain, increasing sob or doe, wheezing, orthopnea, PND, increased LE swelling, palpitations, dizziness or syncope.  Pt denies new neurological symptoms such as new headache, or facial or extremity weakness or numbness.  Pt denies polydipsia, polyuria, or low sugar episode.  Pt states overall good compliance with meds, mostly trying to follow appropriate diet, with wt overall stable,  but little exercise however.  Lost 7 lbs with better diet, trying to lose another 10 Wt Readings from Last 3 Encounters:  04/13/19 166 lb (75.3 kg)  10/13/18 171 lb (77.6 kg)  08/11/18 173 lb (78.5 kg)   BP Readings from Last 3 Encounters:  04/13/19 134/86  10/13/18 122/78  08/11/18 118/78  Pt is interested in Cardiac CT score. Past Medical History:  Diagnosis Date  . ALLERGIC RHINITIS 04/06/2007  . ANXIETY 04/06/2007  . Arthritis   . Cataract   . CIN I (cervical intraepithelial neoplasia I)   . COLONIC POLYPS 08/04/2007  . Diabetes mellitus without complication (HCC)   . DIARRHEA, ACUTE 09/23/2010  . Fibroid   . GASTRIC POLYP 08/04/2007  . GERD 08/04/2007  . GOITER, MULTINODULAR 03/10/2009  . Headache(784.0) 11/13/2008  . Hx of blood clots   . HYPERLIPIDEMIA 04/06/2007  . HYPERTENSION 04/06/2007  . ISCHEMIC COLITIS, HX OF 07/06/2007  . Lichen sclerosus 2014  . Osteopenia 08/2018   T score -1.1 FRAX 7% / 0.5%  . Palpitations 03/10/2009  . Rosacea 11/30/2007  . THYROID NODULE, LEFT 11/04/2010  . Urinary frequency   . UTI'S, RECURRENT 11/30/2007   Past Surgical History:  Procedure Laterality Date  . APPENDECTOMY  2005  . bladder tack    . COLPOSCOPY    . CYSTOSCOPY  2007  . DERMOID CYST  EXCISION  1994   right ovary  . FLEXIBLE SIGMOIDOSCOPY  11/16/95  . Lt. pectoral/subclavicular abscess drainage  2006  . removed skull cyst  1973  . right foot  neuroma  1988  . uterine fibroids removed  1996    reports that she has never smoked. She has never used smokeless tobacco. She reports current alcohol use. She reports that she does not use drugs. family history includes Breast cancer in her paternal aunt; Deep vein thrombosis in her brother; Diabetes in her mother; Emphysema in her father; Heart disease in her father and mother; Hypertension in her mother; Lung cancer in her maternal grandmother, maternal uncle, and mother; Macular degeneration in her brother; Stroke in her brother and father. Allergies  Allergen Reactions  . Atorvastatin     REACTION: extreme muscle aches  . Cefuroxime Axetil     REACTION: DIARRHEA  . Ciprofloxacin     REACTION: diarrhea  . Latex     REACTION: rash  . Lipitor [Atorvastatin Calcium] Other (See Comments)    sensitive  . Moxifloxacin     REACTION: DIARRHEA   Current Outpatient Medications on File Prior to Visit  Medication Sig Dispense Refill  . aspirin 81 MG EC tablet Take 81 mg by mouth daily.      . Azelastine-Fluticasone (DYMISTA NA) Place into the nose.    . Blood Glucose Monitoring Suppl (ONE TOUCH ULTRA 2) W/DEVICE KIT Use as directed 1 per day 1 each 0  . Cetirizine HCl (ZYRTEC PO) Take by mouth.    . Cholecalciferol (VITAMIN   D3 PO) Take by mouth.    Marland Kitchen GLUCOSAMINE-CHONDROITIN PO Take by mouth.    Marland Kitchen glucose blood test strip Use as instructed 100 each 12  . hydrochlorothiazide (HYDRODIURIL) 25 MG tablet Take 1 tablet (25 mg total) by mouth daily. 90 tablet 3  . Lancets MISC Use as directed 1 per day 100 each 12  . losartan (COZAAR) 100 MG tablet Take 1 tablet (100 mg total) by mouth daily. 90 tablet 3  . Multiple Vitamins-Minerals (MULTIVITAMIN PO) Take by mouth daily.    Marland Kitchen omeprazole (PRILOSEC) 20 MG capsule Take 20 mg by mouth daily.    . potassium chloride SA (K-DUR,KLOR-CON) 20 MEQ tablet TAKE 1 TABLET BY MOUTH TWO  TIMES DAILY 180 tablet 3  . Probiotic Product (PROBIOTIC PO) Take by  mouth.    . rosuvastatin (CRESTOR) 20 MG tablet TAKE 1 TABLET BY MOUTH  DAILY 90 tablet 3  . trimethoprim (TRIMPEX) 100 MG tablet Take 100 mg by mouth 2 (two) times daily.     No current facility-administered medications on file prior to visit.    Review of Systems  Constitutional: Negative for other unusual diaphoresis or sweats HENT: Negative for ear discharge or swelling Eyes: Negative for other worsening visual disturbances Respiratory: Negative for stridor or other swelling  Gastrointestinal: Negative for worsening distension or other blood Genitourinary: Negative for retention or other urinary change Musculoskeletal: Negative for other MSK pain or swelling Skin: Negative for color change or other new lesions Neurological: Negative for worsening tremors and other numbness  Psychiatric/Behavioral: Negative for worsening agitation or other fatigue All other system neg per pt    Objective:   Physical Exam BP 134/86   Pulse 71   Temp 98.4 F (36.9 C) (Oral)   Ht 5' 1.5" (1.562 m)   Wt 166 lb (75.3 kg)   SpO2 98%   BMI 30.86 kg/m  VS noted,  Constitutional: Pt appears in NAD HENT: Head: NCAT.  Right Ear: External ear normal.  Left Ear: External ear normal.  Eyes: . Pupils are equal, round, and reactive to light. Conjunctivae and EOM are normal Nose: without d/c or deformity Neck: Neck supple. Gross normal ROM Cardiovascular: Normal rate and regular rhythm.   Pulmonary/Chest: Effort normal and breath sounds without rales or wheezing.  Abd:  Soft, NT, ND, + BS, no organomegaly Neurological: Pt is alert. At baseline orientation, motor grossly intact Skin: Skin is warm. No rashes, other new lesions, no LE edema Psychiatric: Pt behavior is normal without agitation  No other exam findings Lab Results  Component Value Date   WBC 6.2 10/11/2018   HGB 13.2 10/11/2018   HCT 39.5 10/11/2018   PLT 225.0 10/11/2018   GLUCOSE 104 (H) 04/04/2019   CHOL 135 04/04/2019   TRIG  155.0 (H) 04/04/2019   HDL 43.50 04/04/2019   LDLDIRECT 83.0 03/24/2017   LDLCALC 60 04/04/2019   ALT 20 04/04/2019   AST 19 04/04/2019   NA 137 04/04/2019   K 3.8 04/04/2019   CL 100 04/04/2019   CREATININE 0.85 04/04/2019   BUN 12 04/04/2019   CO2 27 04/04/2019   TSH 1.14 10/11/2018   HGBA1C 6.3 04/04/2019   MICROALBUR <0.7 10/11/2018      Assessment & Plan:

## 2019-04-14 ENCOUNTER — Encounter: Payer: Self-pay | Admitting: Internal Medicine

## 2019-04-14 NOTE — Assessment & Plan Note (Signed)
stable overall by history and exam, recent data reviewed with pt, and pt to continue medical treatment as before,  to f/u any worsening symptoms or concerns  

## 2019-06-05 ENCOUNTER — Other Ambulatory Visit: Payer: Self-pay

## 2019-06-05 ENCOUNTER — Ambulatory Visit (INDEPENDENT_AMBULATORY_CARE_PROVIDER_SITE_OTHER)
Admission: RE | Admit: 2019-06-05 | Discharge: 2019-06-05 | Disposition: A | Discharge status: Home / Self Care | Payer: Self-pay | Source: Ambulatory Visit | Attending: Internal Medicine | Admitting: Internal Medicine

## 2019-06-05 DIAGNOSIS — E119 Type 2 diabetes mellitus without complications: Secondary | ICD-10-CM

## 2019-06-05 DIAGNOSIS — E785 Hyperlipidemia, unspecified: Secondary | ICD-10-CM

## 2019-06-05 DIAGNOSIS — I10 Essential (primary) hypertension: Secondary | ICD-10-CM

## 2019-06-19 ENCOUNTER — Encounter: Payer: Self-pay | Admitting: Gynecology

## 2019-08-13 ENCOUNTER — Other Ambulatory Visit: Payer: Self-pay

## 2019-08-14 ENCOUNTER — Encounter: Payer: Self-pay | Admitting: Gynecology

## 2019-08-14 ENCOUNTER — Ambulatory Visit (INDEPENDENT_AMBULATORY_CARE_PROVIDER_SITE_OTHER): Payer: 59 | Admitting: Gynecology

## 2019-08-14 VITALS — BP 120/76 | Ht 61.0 in | Wt 167.0 lb

## 2019-08-14 DIAGNOSIS — L280 Lichen simplex chronicus: Secondary | ICD-10-CM | POA: Diagnosis not present

## 2019-08-14 DIAGNOSIS — Z01419 Encounter for gynecological examination (general) (routine) without abnormal findings: Secondary | ICD-10-CM

## 2019-08-14 DIAGNOSIS — N952 Postmenopausal atrophic vaginitis: Secondary | ICD-10-CM | POA: Diagnosis not present

## 2019-08-14 DIAGNOSIS — M8580 Other specified disorders of bone density and structure, unspecified site: Secondary | ICD-10-CM | POA: Diagnosis not present

## 2019-08-14 DIAGNOSIS — Z23 Encounter for immunization: Secondary | ICD-10-CM | POA: Diagnosis not present

## 2019-08-14 DIAGNOSIS — Z1151 Encounter for screening for human papillomavirus (HPV): Secondary | ICD-10-CM

## 2019-08-14 NOTE — Addendum Note (Signed)
Addended by: Nelva Nay on: 08/14/2019 03:11 PM   Modules accepted: Orders

## 2019-08-14 NOTE — Progress Notes (Signed)
    IKEA POSTEN Nov 23, 1955 [id number]        63 y.o.  G0P0 for annual gynecologic exam.  Without gynecologic complaints.  Past medical history,surgical history, problem list, medications, allergies, family history and social history were all reviewed and documented as reviewed in the EPIC chart.  ROS:  Performed with pertinent positives and negatives included in the history, assessment and plan.   Additional significant findings : None   Exam: Caryn Bee assistant Vitals:   08/14/19 1419  BP: 120/76  Weight: 167 lb (75.8 kg)  Height: 5\' 1"  (1.549 m)   Body mass index is 31.55 kg/m.  General appearance:  Normal affect, orientation and appearance. Skin: Grossly normal HEENT: Without gross lesions.  No cervical or supraclavicular adenopathy. Thyroid normal.  Lungs:  Clear without wheezing, rales or rhonchi Cardiac: RR, without RMG Abdominal:  Soft, nontender, without masses, guarding, rebound, organomegaly or hernia Breasts:  Examined lying and sitting without masses, retractions, discharge or axillary adenopathy. Pelvic:  Ext, BUS, Vagina: With atrophic changes.  Mild blanching of the skin lower perineal body consistent with her history of lichen simplex chronicus  Cervix: With atrophic changes.  Pap smear/HPV  Uterus: Anteverted, normal size, shape and contour, midline and mobile nontender   Adnexa: Without masses or tenderness    Anus and perineum: Normal   Rectovaginal: Normal sphincter tone without palpated masses or tenderness.    Assessment/Plan:  63 y.o. G0P0 female for annual gynecologic exam.   1. Postmenopausal.  Without significant menopausal symptoms or any vaginal bleeding. 2. History of lichen simplex chronicus.  Uses OTC products for relief.  Had used Temovate in the past but prefers OTC products.  Exam consistent with diagnoses.  No concerning changes. 3. Pap smear/HPV 2015.  Pap smear/HPV today.  History of CIN-1 1994 with normal Pap smears since. 4.  Mammography coming due in January and I reminded her to schedule this.  Breast exam normal today. 5. Osteopenia.  DEXA 2019 T score -1.1 FRAX 7% / 2.5%.  Repeat in several years. 6. Colonoscopy 2018.  Repeat at their recommended interval 7. Health maintenance.  No routine lab work done as patient does this elsewhere.  Follow-up 1 year, sooner as needed.   Anastasio Auerbach MD, 2:46 PM 08/14/2019

## 2019-08-14 NOTE — Patient Instructions (Signed)
Follow-up in 1 year for annual exam, sooner if any issues. 

## 2019-08-15 ENCOUNTER — Encounter: Payer: 59 | Admitting: Gynecology

## 2019-08-17 LAB — PAP IG AND HPV HIGH-RISK: HPV DNA High Risk: NOT DETECTED

## 2019-08-22 ENCOUNTER — Other Ambulatory Visit: Payer: Self-pay | Admitting: Internal Medicine

## 2019-08-22 DIAGNOSIS — Z1231 Encounter for screening mammogram for malignant neoplasm of breast: Secondary | ICD-10-CM

## 2019-09-11 ENCOUNTER — Other Ambulatory Visit: Payer: Self-pay | Admitting: Internal Medicine

## 2019-10-02 ENCOUNTER — Other Ambulatory Visit: Payer: Self-pay | Admitting: Internal Medicine

## 2019-10-02 NOTE — Telephone Encounter (Signed)
Please refill as per office routine med refill policy (all routine meds refilled for 3 mo or monthly per pt preference up to one year from last visit, then month to month grace period for 3 mo, then further med refills will have to be denied)  

## 2019-10-12 ENCOUNTER — Other Ambulatory Visit: Payer: Self-pay

## 2019-10-12 ENCOUNTER — Telehealth: Payer: Self-pay

## 2019-10-12 ENCOUNTER — Ambulatory Visit
Admission: RE | Admit: 2019-10-12 | Discharge: 2019-10-12 | Disposition: A | Discharge status: Home / Self Care | Payer: 59 | Source: Ambulatory Visit | Attending: Internal Medicine | Admitting: Internal Medicine

## 2019-10-12 DIAGNOSIS — Z1231 Encounter for screening mammogram for malignant neoplasm of breast: Secondary | ICD-10-CM

## 2019-10-12 DIAGNOSIS — E119 Type 2 diabetes mellitus without complications: Secondary | ICD-10-CM

## 2019-10-12 NOTE — Telephone Encounter (Signed)
Pt had lab ordered pending but I extended the expiration date.

## 2019-10-12 NOTE — Telephone Encounter (Addendum)
Please place orders for A1C and labs for 69mo fu scheduled 11/02/19. Lab scheduled 10/26/19.

## 2019-10-17 ENCOUNTER — Ambulatory Visit: Payer: 59 | Admitting: Internal Medicine

## 2019-10-17 ENCOUNTER — Other Ambulatory Visit: Payer: Self-pay | Admitting: Internal Medicine

## 2019-10-17 NOTE — Telephone Encounter (Signed)
Please refill as per office routine med refill policy (all routine meds refilled for 3 mo or monthly per pt preference up to one year from last visit, then month to month grace period for 3 mo, then further med refills will have to be denied)  

## 2019-10-19 ENCOUNTER — Ambulatory Visit (INDEPENDENT_AMBULATORY_CARE_PROVIDER_SITE_OTHER): Payer: 59 | Admitting: Otolaryngology

## 2019-10-24 ENCOUNTER — Ambulatory Visit (INDEPENDENT_AMBULATORY_CARE_PROVIDER_SITE_OTHER): Payer: 59 | Admitting: Otolaryngology

## 2019-10-24 ENCOUNTER — Other Ambulatory Visit: Payer: Self-pay

## 2019-10-24 VITALS — Temp 97.9°F

## 2019-10-24 DIAGNOSIS — J329 Chronic sinusitis, unspecified: Secondary | ICD-10-CM | POA: Diagnosis not present

## 2019-10-24 DIAGNOSIS — J310 Chronic rhinitis: Secondary | ICD-10-CM

## 2019-10-24 NOTE — Progress Notes (Signed)
HPI: [name] [name] is a 64 y.o. female who returns today for evaluation of sinuses.  She complains of intermittent bloody discharge when she blows her nose.  She also has some stuffiness.  She has had some discomfort in the right ear.  She has had occasional headaches.  She is also a pressure in her cheek area below her eyes.  She is also has noticed some nodules in her neck.  This has been going on and off for the past year.  And more recently has developed a UTI. She takes Dymista for chronic allergies..  Past Medical History:  Diagnosis Date  . ALLERGIC RHINITIS 04/06/2007  . ANXIETY 04/06/2007  . Arthritis   . Cataract   . CIN I (cervical intraepithelial neoplasia I)   . COLONIC POLYPS 08/04/2007  . Diabetes mellitus without complication ([place])   . DIARRHEA, ACUTE 09/23/2010  . Fibroid   . GASTRIC POLYP 08/04/2007  . GERD 08/04/2007  . GOITER, MULTINODULAR 03/10/2009  . Headache(784.0) 11/13/2008  . Hx of blood clots   . HYPERLIPIDEMIA 04/06/2007  . HYPERTENSION 04/06/2007  . ISCHEMIC COLITIS, HX OF 07/06/2007  . Lichen sclerosus 8889  . Osteopenia 08/2018   T score -1.1 FRAX 7% / 0.5%  . Palpitations 03/10/2009  . Rosacea 11/30/2007  . THYROID NODULE, LEFT 11/04/2010  . Urinary frequency   . UTI'S, RECURRENT 11/30/2007   Past Surgical History:  Procedure Laterality Date  . APPENDECTOMY  2005  . bladder tack    . COLPOSCOPY    . CYSTOSCOPY  2007  . DERMOID CYST  EXCISION  1994   right ovary  . FLEXIBLE SIGMOIDOSCOPY  11/16/95  . Lt. pectoral/subclavicular abscess drainage  2006  . removed skull cyst  1973  . right foot neuroma  1988  . uterine fibroids removed  1996   Social History   Socioeconomic History  . Marital status: Single    Spouse name: Not on file  . Number of children: Not on file  . Years of education: Not on file  . Highest education level: Not on file  Occupational History  . Not on file  Tobacco Use  . Smoking status: Never Smoker  . Smokeless  tobacco: Never Used  . Tobacco comment: passive tobacco smoke exposure as a child  Substance and Sexual Activity  . Alcohol use: Yes    Alcohol/week: 0.0 standard drinks    Comment: Rare  . Drug use: No  . Sexual activity: Never    Birth control/protection: Post-menopausal    Comment: 1st intercourse 64 yo-Fewer than 5 partners  Other Topics Concern  . Not on file  Social History Narrative  . Not on file   Social Determinants of Health   Financial Resource Strain:   . Difficulty of Paying Living Expenses: Not on file  Food Insecurity:   . Worried About Charity fundraiser in the Last Year: Not on file  . Ran Out of Food in the Last Year: Not on file  Transportation Needs:   . Lack of Transportation (Medical): Not on file  . Lack of Transportation (Non-Medical): Not on file  Physical Activity:   . Days of Exercise per Week: Not on file  . Minutes of Exercise per Session: Not on file  Stress:   . Feeling of Stress : Not on file  Social Connections:   . Frequency of Communication with Friends and Family: Not on file  . Frequency of Social Gatherings with Friends and Family: Not  on file  . Attends Religious Services: Not on file  . Active Member of Clubs or Organizations: Not on file  . Attends Archivist Meetings: Not on file  . Marital Status: Not on file   Family History  Problem Relation Age of Onset  . Stroke Brother   . Deep vein thrombosis Brother   . Macular degeneration Brother   . Diabetes Mother   . Hypertension Mother   . Heart disease Mother   . Lung cancer Mother   . Lung cancer Maternal Grandmother   . Heart disease Father   . Stroke Father   . Emphysema Father   . Breast cancer Paternal Aunt        Age 43's  . Lung cancer Maternal Uncle   . Colon cancer Neg Hx   . Stomach cancer Neg Hx   . Rectal cancer Neg Hx   . Esophageal cancer Neg Hx   . Liver cancer Neg Hx    Allergies  Allergen Reactions  . Atorvastatin     REACTION: extreme  muscle aches  . Cefuroxime Axetil     REACTION: DIARRHEA  . Ciprofloxacin     REACTION: diarrhea  . Latex     REACTION: rash  . Lipitor [Atorvastatin Calcium] Other (See Comments)    sensitive  . Moxifloxacin     REACTION: DIARRHEA   Prior to Admission medications   Medication Sig Start Date End Date Taking? Authorizing Provider  Azelastine-Fluticasone (DYMISTA NA) Place into the nose.   Yes [provider]  Blood Glucose Monitoring Suppl (ONE TOUCH ULTRA 2) W/DEVICE KIT Use as directed 1 per day 09/12/15  Yes Biagio Borg, MD  Cetirizine HCl (ZYRTEC PO) Take by mouth.   Yes [provider]  Cholecalciferol (VITAMIN D3 PO) Take by mouth.   Yes [provider]  GLUCOSAMINE-CHONDROITIN PO Take by mouth.   Yes [provider]  glucose blood test strip Use as instructed 09/12/15  Yes Biagio Borg, MD  hydrochlorothiazide (HYDRODIURIL) 25 MG tablet TAKE 1 TABLET BY MOUTH  DAILY 10/03/19  Yes Biagio Borg, MD  Lancets MISC Use as directed 1 per day 09/12/15  Yes Biagio Borg, MD  losartan (COZAAR) 100 MG tablet TAKE 1 TABLET BY MOUTH  DAILY 10/03/19  Yes Biagio Borg, MD  Multiple Vitamins-Minerals (MULTIVITAMIN PO) Take by mouth daily.   Yes [provider]  omeprazole (PRILOSEC) 20 MG capsule Take 20 mg by mouth daily.   Yes [provider]  potassium chloride SA (KLOR-CON) 20 MEQ tablet Take 1 tablet (20 mEq total) by mouth 2 (two) times daily. Must keep scheduled appt for future refills 10/18/19  Yes Biagio Borg, MD  Probiotic Product (PROBIOTIC PO) Take by mouth.   Yes [provider]  rosuvastatin (CRESTOR) 20 MG tablet TAKE 1 TABLET BY MOUTH  DAILY 09/01/18  Yes Biagio Borg, MD  trimethoprim (TRIMPEX) 100 MG tablet Take 100 mg by mouth 2 (two) times daily.   Yes [provider]     Positive ROS: Otherwise negative  All other systems have been reviewed and were otherwise negative with the exception of  those mentioned in the HPI and as above.  Physical Exam: Constitutional: Alert, well-appearing, no acute distress Ears: External ears without lesions or tenderness.  She had minimal wax in her ear canals that was nonobstructing.  Both TMs were clear with good mobility on pneumatic otoscopy.  Hearing screening with a tuning  forks revealed symmetric hearing with AC > BC bilaterally. Nasal: External nose without lesions. Septum with minimal deformity although she has some crusting along the right anterior septum where she has a small scab in dried blood.  She has moderate rhinitis but no polyps.  A little bit of thick discharge.. Oral: Lips and gums without lesions. Tongue and palate mucosa without lesions. Posterior oropharynx clear. Neck: No palpable adenopathy or masses.  Due to nodule she notices in the neck represent the submandibular glands bilaterally.  No significant adenopathy noted. Respiratory: Breathing comfortably  Skin: No facial/neck lesions or rash noted.  Procedures  Assessment: Chronic rhinitis with questionable sinusitis. UTI according to the patient.  Plan: Recommended regular use of her Dymista as she has been using.  Suggested saline nasal irrigations when she is having much congestion or drainage from her nose. Also prescribed mupirocin 2% ointment to apply to the right nostril where she has some scabbing crusting and little bit of bleeding along the septum. Prescribed Bactrim DS twice daily for 10 days for her UTI and sinus issues.   Radene Journey, MD

## 2019-10-26 ENCOUNTER — Other Ambulatory Visit (INDEPENDENT_AMBULATORY_CARE_PROVIDER_SITE_OTHER): Payer: 59

## 2019-10-26 ENCOUNTER — Other Ambulatory Visit: Payer: Self-pay

## 2019-10-26 DIAGNOSIS — Z0000 Encounter for general adult medical examination without abnormal findings: Secondary | ICD-10-CM

## 2019-10-26 DIAGNOSIS — E119 Type 2 diabetes mellitus without complications: Secondary | ICD-10-CM

## 2019-10-26 LAB — LIPID PANEL
Cholesterol: 240 mg/dL — ABNORMAL HIGH (ref 0–200)
HDL: 41.4 mg/dL (ref >39.00)
NonHDL: 198.66
Total CHOL/HDL Ratio: 6
Triglycerides: 229 mg/dL — ABNORMAL HIGH (ref 0.0–149.0)
VLDL: 45.8 mg/dL — ABNORMAL HIGH (ref 0.0–40.0)

## 2019-10-26 LAB — HEPATIC FUNCTION PANEL
ALT: 27 U/L (ref 0–35)
AST: 21 U/L (ref 0–37)
Albumin: 4.1 g/dL (ref 3.5–5.2)
Alkaline Phosphatase: 83 U/L (ref 39–117)
Bilirubin, Direct: 0.1 mg/dL (ref 0.0–0.3)
Total Bilirubin: 0.4 mg/dL (ref 0.2–1.2)
Total Protein: 7.4 g/dL (ref 6.0–8.3)

## 2019-10-26 LAB — URINALYSIS, ROUTINE W REFLEX MICROSCOPIC
Bilirubin Urine: NEGATIVE
Ketones, ur: NEGATIVE
Nitrite: NEGATIVE
Specific Gravity, Urine: 1.01 (ref 1.000–1.030)
Total Protein, Urine: NEGATIVE
Urine Glucose: NEGATIVE
Urobilinogen, UA: 0.2 (ref 0.0–1.0)
pH: 6.5 (ref 5.0–8.0)

## 2019-10-26 LAB — CBC WITH DIFFERENTIAL/PLATELET
Basophils Absolute: 0.1 10*3/uL (ref 0.0–0.1)
Basophils Relative: 0.6 % (ref 0.0–3.0)
Eosinophils Absolute: 0.1 10*3/uL (ref 0.0–0.7)
Eosinophils Relative: 1 % (ref 0.0–5.0)
HCT: 37.2 % (ref 36.0–46.0)
Hemoglobin: 12.5 g/dL (ref 12.0–15.0)
Lymphocytes Relative: 16.4 % (ref 12.0–46.0)
Lymphs Abs: 1.7 10*3/uL (ref 0.7–4.0)
MCHC: 33.5 g/dL (ref 30.0–36.0)
MCV: 82.8 fl (ref 78.0–100.0)
Monocytes Absolute: 1.5 10*3/uL — ABNORMAL HIGH (ref 0.1–1.0)
Monocytes Relative: 15 % — ABNORMAL HIGH (ref 3.0–12.0)
Neutro Abs: 6.9 10*3/uL (ref 1.4–7.7)
Neutrophils Relative %: 67 % (ref 43.0–77.0)
Platelets: 208 10*3/uL (ref 150.0–400.0)
RBC: 4.49 Mil/uL (ref 3.87–5.11)
RDW: 13.8 % (ref 11.5–15.5)
WBC: 10.3 10*3/uL (ref 4.0–10.5)

## 2019-10-26 LAB — TSH: TSH: 1.51 u[IU]/mL (ref 0.35–4.50)

## 2019-10-26 LAB — MICROALBUMIN / CREATININE URINE RATIO
Creatinine,U: 41.2 mg/dL
Microalb Creat Ratio: 1.8 mg/g (ref 0.0–30.0)
Microalb, Ur: 0.8 mg/dL (ref 0.0–1.9)

## 2019-10-26 LAB — LDL CHOLESTEROL, DIRECT: Direct LDL: 154 mg/dL

## 2019-10-26 LAB — BASIC METABOLIC PANEL
BUN: 14 mg/dL (ref 6–23)
CO2: 24 mEq/L (ref 19–32)
Calcium: 9 mg/dL (ref 8.4–10.5)
Chloride: 96 mEq/L (ref 96–112)
Creatinine, Ser: 0.99 mg/dL (ref 0.40–1.20)
GFR: 56.53 mL/min — ABNORMAL LOW (ref >60.00)
Glucose, Bld: 125 mg/dL — ABNORMAL HIGH (ref 70–99)
Potassium: 4 mEq/L (ref 3.5–5.1)
Sodium: 130 mEq/L — ABNORMAL LOW (ref 135–145)

## 2019-10-26 LAB — HEMOGLOBIN A1C: Hgb A1c MFr Bld: 6.5 % (ref 4.6–6.5)

## 2019-11-02 ENCOUNTER — Ambulatory Visit: Payer: 59 | Admitting: Internal Medicine

## 2019-11-06 ENCOUNTER — Other Ambulatory Visit: Payer: Self-pay

## 2019-11-06 ENCOUNTER — Ambulatory Visit: Payer: 59 | Admitting: Internal Medicine

## 2019-11-06 ENCOUNTER — Encounter: Payer: Self-pay | Admitting: Internal Medicine

## 2019-11-06 VITALS — BP 138/74 | Temp 98.2°F | Ht 61.0 in | Wt 172.6 lb

## 2019-11-06 DIAGNOSIS — E559 Vitamin D deficiency, unspecified: Secondary | ICD-10-CM | POA: Diagnosis not present

## 2019-11-06 DIAGNOSIS — E119 Type 2 diabetes mellitus without complications: Secondary | ICD-10-CM

## 2019-11-06 DIAGNOSIS — N179 Acute kidney failure, unspecified: Secondary | ICD-10-CM

## 2019-11-06 DIAGNOSIS — Z0000 Encounter for general adult medical examination without abnormal findings: Secondary | ICD-10-CM

## 2019-11-06 DIAGNOSIS — E611 Iron deficiency: Secondary | ICD-10-CM

## 2019-11-06 DIAGNOSIS — E538 Deficiency of other specified B group vitamins: Secondary | ICD-10-CM

## 2019-11-06 LAB — BASIC METABOLIC PANEL
BUN: 14 mg/dL (ref 6–23)
CO2: 27 mEq/L (ref 19–32)
Calcium: 9.7 mg/dL (ref 8.4–10.5)
Chloride: 100 mEq/L (ref 96–112)
Creatinine, Ser: 0.84 mg/dL (ref 0.40–1.20)
GFR: 68.32 mL/min (ref >60.00)
Glucose, Bld: 101 mg/dL — ABNORMAL HIGH (ref 70–99)
Potassium: 3.5 mEq/L (ref 3.5–5.1)
Sodium: 137 mEq/L (ref 135–145)

## 2019-11-06 MED ORDER — LOSARTAN POTASSIUM 100 MG PO TABS: 100.0000 mg | ORAL_TABLET | Freq: Every day | ORAL | 3 refills | Status: DC

## 2019-11-06 MED ORDER — HYDROCHLOROTHIAZIDE 25 MG PO TABS: 25.0000 mg | ORAL_TABLET | Freq: Every day | ORAL | 3 refills | Status: DC

## 2019-11-06 MED ORDER — POTASSIUM CHLORIDE CRYS ER 20 MEQ PO TBCR: 20.0000 meq | EXTENDED_RELEASE_TABLET | Freq: Two times a day (BID) | ORAL | 3 refills | Status: DC

## 2019-11-06 MED ORDER — ROSUVASTATIN CALCIUM 40 MG PO TABS: 40.0000 mg | ORAL_TABLET | ORAL | 3 refills | Status: DC

## 2019-11-06 NOTE — Patient Instructions (Addendum)
Please call after 2 wks post covid shot #2 for Tdap as well as eventually the Shingrx shot x 2 as well.    Ok to restart the crestor at 40 mg every other day  Please continue all other medications as before, and refills have been done if requested.  Please have the pharmacy call with any other refills you may need.  Please continue your efforts at being more active, low cholesterol diet, and weight control.  You are otherwise up to date with prevention measures today.  Please keep your appointments with your specialists as you may have planned  Please go to the LAB at the blood drawing area for the tests to be done - just the kidney function today  You will be contacted by phone if any changes need to be made immediately.  Otherwise, you will receive a letter about your results with an explanation, but please check with MyChart first.  Please remember to sign up for MyChart if you have not done so, as this will be important to you in the future with finding out test results, communicating by private email, and scheduling acute appointments online when needed.  Please make an Appointment to return in 6 months, or sooner if needed, also with Lab Appointment for testing done 3-5 days before at the [facility] (so this is for TWO appointments - please see the scheduling desk as you leave)

## 2019-11-06 NOTE — Assessment & Plan Note (Signed)
Suspect related to septra - for f/u bmp

## 2019-11-06 NOTE — Assessment & Plan Note (Signed)

## 2019-11-06 NOTE — Assessment & Plan Note (Signed)
stable overall by history and exam, recent data reviewed with pt, and pt to continue medical treatment as before,  to f/u any worsening symptoms or concerns  

## 2019-11-06 NOTE — Progress Notes (Signed)
Subjective:    Patient ID: [name] [name], female    DOB: Dec 30, 1955, 64 y.o.   MRN: [id number]  HPI Here for wellness and f/u;  Overall doing ok;  Pt denies Chest pain, worsening SOB, DOE, wheezing, orthopnea, PND, worsening LE edema, palpitations, dizziness or syncope.  Pt denies neurological change such as new headache, facial or extremity weakness.  Pt denies polydipsia, polyuria, or low sugar symptoms. Pt states overall good compliance with treatment and medications, good tolerability, and has been trying to follow appropriate diet.  Pt denies worsening depressive symptoms, suicidal ideation or panic. No fever, night sweats, wt loss, loss of appetite, or other constitutional symptoms.  Pt states good ability with ADL's, has low fall risk, home safety reviewed and adequate, no other significant changes in hearing or vision, and only occasionally active with exercise.  Not taking much if at all crestor due to calf pain and arm pain.  S[ covid #1 recently. Also s/p septra course for UTI finished last wk Wt Readings from Last 3 Encounters:  11/06/19 172 lb 9.6 oz (78.3 kg)  08/14/19 167 lb (75.8 kg)  04/13/19 166 lb (75.3 kg)   Past Medical History:  Diagnosis Date  . ALLERGIC RHINITIS 04/06/2007  . ANXIETY 04/06/2007  . Arthritis   . Cataract   . CIN I (cervical intraepithelial neoplasia I)   . COLONIC POLYPS 08/04/2007  . Diabetes mellitus without complication ([place])   . DIARRHEA, ACUTE 09/23/2010  . Fibroid   . GASTRIC POLYP 08/04/2007  . GERD 08/04/2007  . GOITER, MULTINODULAR 03/10/2009  . Headache(784.0) 11/13/2008  . Hx of blood clots   . HYPERLIPIDEMIA 04/06/2007  . HYPERTENSION 04/06/2007  . ISCHEMIC COLITIS, HX OF 07/06/2007  . Lichen sclerosus 8453  . Osteopenia 08/2018   T score -1.1 FRAX 7% / 0.5%  . Palpitations 03/10/2009  . Rosacea 11/30/2007  . THYROID NODULE, LEFT 11/04/2010  . Urinary frequency   . UTI'S, RECURRENT 11/30/2007   Past Surgical History:  Procedure  Laterality Date  . APPENDECTOMY  2005  . bladder tack    . COLPOSCOPY    . CYSTOSCOPY  2007  . DERMOID CYST  EXCISION  1994   right ovary  . FLEXIBLE SIGMOIDOSCOPY  11/16/95  . Lt. pectoral/subclavicular abscess drainage  2006  . removed skull cyst  1973  . right foot neuroma  1988  . uterine fibroids removed  1996    reports that she has never smoked. She has never used smokeless tobacco. She reports current alcohol use. She reports that she does not use drugs. family history includes Breast cancer in her paternal aunt; Deep vein thrombosis in her brother; Diabetes in her mother; Emphysema in her father; Heart disease in her father and mother; Hypertension in her mother; Lung cancer in her maternal grandmother, maternal uncle, and mother; Macular degeneration in her brother; Stroke in her brother and father. Allergies  Allergen Reactions  . Atorvastatin     REACTION: extreme muscle aches  . Cefuroxime Axetil     REACTION: DIARRHEA  . Ciprofloxacin     REACTION: diarrhea  . Latex     REACTION: rash  . Lipitor [Atorvastatin Calcium] Other (See Comments)    sensitive  . Moxifloxacin     REACTION: DIARRHEA   Current Outpatient Medications on File Prior to Visit  Medication Sig Dispense Refill  . Azelastine-Fluticasone (DYMISTA NA) Place into the nose.    . Blood Glucose Monitoring Suppl (ONE TOUCH ULTRA 2) W/DEVICE  KIT Use as directed 1 per day 1 each 0  . Cetirizine HCl (ZYRTEC PO) Take by mouth.    . Cholecalciferol (VITAMIN D3 PO) Take by mouth.    . Cranberry 1000 MG CAPS Take by mouth 2 (two) times daily.    Marland Kitchen GLUCOSAMINE-CHONDROITIN PO Take by mouth.    Marland Kitchen glucose blood test strip Use as instructed 100 each 12  . Lancets MISC Use as directed 1 per day 100 each 12  . magnesium 30 MG tablet Take 30 mg by mouth 1 day or 1 dose.    Marland Kitchen omeprazole (PRILOSEC) 20 MG capsule Take 20 mg by mouth daily.    . Probiotic Product (PROBIOTIC PO) Take by mouth.    . trimethoprim (TRIMPEX)  100 MG tablet Take 100 mg by mouth 2 (two) times daily.    . Multiple Vitamins-Minerals (MULTIVITAMIN PO) Take by mouth daily.     No current facility-administered medications on file prior to visit.   Review of Systems All otherwise neg per pt     Objective:   Physical Exam BP 138/74 (BP Location: Left Arm, Patient Position: Sitting, Cuff Size: Normal)   Temp 98.2 F (36.8 C) (Oral)   Ht 5' 1"  (1.549 m)   Wt 172 lb 9.6 oz (78.3 kg)   BMI 32.61 kg/m  VS noted,  Constitutional: Pt appears in NAD HENT: Head: NCAT.  Right Ear: External ear normal.  Left Ear: External ear normal.  Eyes: . Pupils are equal, round, and reactive to light. Conjunctivae and EOM are normal Nose: without d/c or deformity Neck: Neck supple. Gross normal ROM Cardiovascular: Normal rate and regular rhythm.   Pulmonary/Chest: Effort normal and breath sounds without rales or wheezing.  Abd:  Soft, NT, ND, + BS, no organomegaly Neurological: Pt is alert. At baseline orientation, motor grossly intact Skin: Skin is warm. No rashes, other new lesions, no LE edema Psychiatric: Pt behavior is normal without agitation  All otherwise neg per pt Lab Results  Component Value Date   WBC 10.3 10/26/2019   HGB 12.5 10/26/2019   HCT 37.2 10/26/2019   PLT 208.0 10/26/2019   GLUCOSE 101 (H) 11/06/2019   CHOL 240 (H) 10/26/2019   TRIG 229.0 (H) 10/26/2019   HDL 41.40 10/26/2019   LDLDIRECT 154.0 10/26/2019   LDLCALC 60 04/04/2019   ALT 27 10/26/2019   AST 21 10/26/2019   NA 137 11/06/2019   K 3.5 11/06/2019   CL 100 11/06/2019   CREATININE 0.84 11/06/2019   BUN 14 11/06/2019   CO2 27 11/06/2019   TSH 1.51 10/26/2019   HGBA1C 6.5 10/26/2019   MICROALBUR 0.8 10/26/2019      Assessment & Plan:

## 2019-11-15 ENCOUNTER — Other Ambulatory Visit: Payer: Self-pay | Admitting: Internal Medicine

## 2019-11-15 MED ORDER — POTASSIUM CHLORIDE CRYS ER 20 MEQ PO TBCR: 20.0000 meq | EXTENDED_RELEASE_TABLET | Freq: Two times a day (BID) | ORAL | 3 refills | Status: DC

## 2020-04-29 ENCOUNTER — Other Ambulatory Visit: Payer: Self-pay

## 2020-04-29 ENCOUNTER — Other Ambulatory Visit: Payer: 59 | Admitting: Internal Medicine

## 2020-04-29 ENCOUNTER — Other Ambulatory Visit (INDEPENDENT_AMBULATORY_CARE_PROVIDER_SITE_OTHER): Payer: 59

## 2020-04-29 DIAGNOSIS — E559 Vitamin D deficiency, unspecified: Secondary | ICD-10-CM

## 2020-04-29 DIAGNOSIS — E611 Iron deficiency: Secondary | ICD-10-CM

## 2020-04-29 DIAGNOSIS — E538 Deficiency of other specified B group vitamins: Secondary | ICD-10-CM | POA: Diagnosis not present

## 2020-04-29 DIAGNOSIS — E119 Type 2 diabetes mellitus without complications: Secondary | ICD-10-CM | POA: Diagnosis not present

## 2020-04-29 LAB — BASIC METABOLIC PANEL
BUN: 20 mg/dL (ref 6–23)
CO2: 27 mEq/L (ref 19–32)
Calcium: 9.7 mg/dL (ref 8.4–10.5)
Chloride: 99 mEq/L (ref 96–112)
Creatinine, Ser: 0.93 mg/dL (ref 0.40–1.20)
GFR: 60.66 mL/min (ref >60.00)
Glucose, Bld: 143 mg/dL — ABNORMAL HIGH (ref 70–99)
Potassium: 3.9 mEq/L (ref 3.5–5.1)
Sodium: 136 mEq/L (ref 135–145)

## 2020-04-29 LAB — HEPATIC FUNCTION PANEL
ALT: 42 U/L — ABNORMAL HIGH (ref 0–35)
AST: 47 U/L — ABNORMAL HIGH (ref 0–37)
Albumin: 4.6 g/dL (ref 3.5–5.2)
Alkaline Phosphatase: 75 U/L (ref 39–117)
Bilirubin, Direct: 0.1 mg/dL (ref 0.0–0.3)
Total Bilirubin: 0.5 mg/dL (ref 0.2–1.2)
Total Protein: 7.4 g/dL (ref 6.0–8.3)

## 2020-04-29 LAB — IBC PANEL
Iron: 86 ug/dL (ref 42–145)
Saturation Ratios: 18.8 % — ABNORMAL LOW (ref 20.0–50.0)
Transferrin: 326 mg/dL (ref 212.0–360.0)

## 2020-04-29 LAB — LIPID PANEL
Cholesterol: 141 mg/dL (ref 0–200)
HDL: 37.5 mg/dL — ABNORMAL LOW (ref >39.00)
LDL Cholesterol: 63 mg/dL (ref 0–99)
NonHDL: 103.07
Total CHOL/HDL Ratio: 4
Triglycerides: 198 mg/dL — ABNORMAL HIGH (ref 0.0–149.0)
VLDL: 39.6 mg/dL (ref 0.0–40.0)

## 2020-04-29 LAB — VITAMIN D 25 HYDROXY (VIT D DEFICIENCY, FRACTURES): VITD: 62.9 ng/mL (ref 30.00–100.00)

## 2020-04-29 LAB — HEMOGLOBIN A1C: Hgb A1c MFr Bld: 8.1 % — ABNORMAL HIGH (ref 4.6–6.5)

## 2020-04-29 LAB — VITAMIN B12: Vitamin B-12: 338 pg/mL (ref 211–911)

## 2020-05-06 ENCOUNTER — Encounter: Payer: Self-pay | Admitting: Internal Medicine

## 2020-05-06 ENCOUNTER — Ambulatory Visit: Payer: 59 | Admitting: Internal Medicine

## 2020-05-06 ENCOUNTER — Other Ambulatory Visit: Payer: Self-pay

## 2020-05-06 VITALS — BP 132/70 | HR 73 | Temp 98.1°F | Wt 174.8 lb

## 2020-05-06 DIAGNOSIS — Z23 Encounter for immunization: Secondary | ICD-10-CM

## 2020-05-06 DIAGNOSIS — E119 Type 2 diabetes mellitus without complications: Secondary | ICD-10-CM | POA: Diagnosis not present

## 2020-05-06 DIAGNOSIS — E785 Hyperlipidemia, unspecified: Secondary | ICD-10-CM | POA: Diagnosis not present

## 2020-05-06 DIAGNOSIS — I10 Essential (primary) hypertension: Secondary | ICD-10-CM | POA: Diagnosis not present

## 2020-05-06 NOTE — Patient Instructions (Signed)
You had the Tdap Tetanus shot today  We can plan for the Shingles shot #1 at your next visit  Please resume your more rigorous diabetic diet and activity as before  We can hold on further medication change for now  Please continue all other medications as before, and refills have been done if requested.  Please have the pharmacy call with any other refills you may need.  Please keep your appointments with your specialists as you may have planned  Please make an Appointment to return in 3 months, or sooner if needed, also with Lab Appointment for testing done 3-5 days before at the [facility] (so this is for TWO appointments - please see the scheduling desk as you leave)

## 2020-05-06 NOTE — Assessment & Plan Note (Signed)
stable overall by history and exam, recent data reviewed with pt, and pt to continue medical treatment as before,  to f/u any worsening symptoms or concerns  

## 2020-05-06 NOTE — Addendum Note (Signed)
Addended by: Earnstine Regal on: 05/06/2020 12:16 PM   Modules accepted: Orders

## 2020-05-06 NOTE — Progress Notes (Signed)
Subjective:    Patient ID: [name] [name], female    DOB: Nov 26, 1955, 64 y.o.   MRN: [id number]  HPI  Here to f/u; overall doing ok,  Pt denies chest pain, increasing sob or doe, wheezing, orthopnea, PND, increased LE swelling, palpitations, dizziness or syncope.  Pt denies new neurological symptoms such as new headache, or facial or extremity weakness or numbness.  Pt denies polydipsia, polyuria, or low sugar episode.  Pt states overall good compliance with meds, but less rigourous DM diet and much less active working from home, with wt overall stable Due for Tdap and shingrix Wt Readings from Last 3 Encounters:  05/06/20 174 lb 12.8 oz (79.3 kg)  11/06/19 172 lb 9.6 oz (78.3 kg)  08/14/19 167 lb (75.8 kg)   Past Medical History:  Diagnosis Date   ALLERGIC RHINITIS 04/06/2007   ANXIETY 04/06/2007   Arthritis    Cataract    CIN I (cervical intraepithelial neoplasia I)    COLONIC POLYPS 08/04/2007   Diabetes mellitus without complication ([place])    DIARRHEA, ACUTE 09/23/2010   Fibroid    GASTRIC POLYP 08/04/2007   GERD 08/04/2007   GOITER, MULTINODULAR 03/10/2009   Headache(784.0) 11/13/2008   Hx of blood clots    HYPERLIPIDEMIA 04/06/2007   HYPERTENSION 04/06/2007   ISCHEMIC COLITIS, HX OF 34/19/6222   Lichen sclerosus 9798   Osteopenia 08/2018   T score -1.1 FRAX 7% / 0.5%   Palpitations 03/10/2009   Rosacea 11/30/2007   THYROID NODULE, LEFT 11/04/2010   Urinary frequency    UTI'S, RECURRENT 11/30/2007   Past Surgical History:  Procedure Laterality Date   APPENDECTOMY  2005   bladder tack     COLPOSCOPY     CYSTOSCOPY  2007   DERMOID CYST  EXCISION  1994   right ovary   FLEXIBLE SIGMOIDOSCOPY  11/16/95   Lt. pectoral/subclavicular abscess drainage  2006   removed skull cyst  1973   right foot neuroma  1988   uterine fibroids removed  1996    reports that she has never smoked. She has never used smokeless tobacco. She reports current alcohol  use. She reports that she does not use drugs. family history includes Breast cancer in her paternal aunt; Deep vein thrombosis in her brother; Diabetes in her mother; Emphysema in her father; Heart disease in her father and mother; Hypertension in her mother; Lung cancer in her maternal grandmother, maternal uncle, and mother; Macular degeneration in her brother; Stroke in her brother and father. Allergies  Allergen Reactions   Atorvastatin     REACTION: extreme muscle aches   Cefuroxime Axetil     REACTION: DIARRHEA   Ciprofloxacin     REACTION: diarrhea   Latex     REACTION: rash   Lipitor [Atorvastatin Calcium] Other (See Comments)    sensitive   Moxifloxacin     REACTION: DIARRHEA   Current Outpatient Medications on File Prior to Visit  Medication Sig Dispense Refill   acetaminophen (TYLENOL) 500 MG tablet Take 500 mg by mouth every 6 (six) hours as needed.     Azelastine-Fluticasone (DYMISTA NA) Place into the nose.     Blood Glucose Monitoring Suppl (ONE TOUCH ULTRA 2) W/DEVICE KIT Use as directed 1 per day 1 each 0   Cetirizine HCl (ZYRTEC PO) Take 10 mg by mouth at bedtime.      Cholecalciferol (VITAMIN D3 PO) Take by mouth.     Cranberry 1000 MG CAPS Take by mouth 2 (  two) times daily.     Emollient (COLLAGEN EX) Take by mouth daily.     GLUCOSAMINE-CHONDROITIN PO Take by mouth.     glucose blood test strip Use as instructed 100 each 12   hydrochlorothiazide (HYDRODIURIL) 25 MG tablet Take 1 tablet (25 mg total) by mouth daily. 90 tablet 3   Lancets MISC Use as directed 1 per day 100 each 12   losartan (COZAAR) 100 MG tablet Take 1 tablet (100 mg total) by mouth daily. 90 tablet 3   magnesium 30 MG tablet Take 30 mg by mouth 1 day or 1 dose.     Multiple Vitamins-Minerals (MULTIVITAMIN PO) Take by mouth daily.     omeprazole (PRILOSEC) 20 MG capsule Take 20 mg by mouth daily.     potassium chloride SA (KLOR-CON) 20 MEQ tablet Take 1 tablet (20 mEq  total) by mouth 2 (two) times daily. Must keep scheduled appt for future refills 180 tablet 3   Probiotic Product (PROBIOTIC PO) Take by mouth.     rosuvastatin (CRESTOR) 40 MG tablet Take 1 tablet (40 mg total) by mouth every other day. 45 tablet 3   trimethoprim (TRIMPEX) 100 MG tablet Take 100 mg by mouth 2 (two) times daily.     vitamin B-12 (CYANOCOBALAMIN) 500 MCG tablet Take 500 mcg by mouth daily.     No current facility-administered medications on file prior to visit.   Review of Systems All otherwise neg per pt    Objective:   Physical Exam BP 132/70 (BP Location: Left Arm)    Pulse 73    Temp 98.1 F (36.7 C) (Oral)    Wt 174 lb 12.8 oz (79.3 kg)    SpO2 97%    BMI 33.03 kg/m   VS noted,  Constitutional: Pt appears in NAD HENT: Head: NCAT.  Right Ear: External ear normal.  Left Ear: External ear normal.  Eyes: . Pupils are equal, round, and reactive to light. Conjunctivae and EOM are normal Nose: without d/c or deformity Neck: Neck supple. Gross normal ROM Cardiovascular: Normal rate and regular rhythm.   Pulmonary/Chest: Effort normal and breath sounds without rales or wheezing.  Abd:  Soft, NT, ND, + BS, no organomegaly Neurological: Pt is alert. At baseline orientation, motor grossly intact Skin: Skin is warm. No rashes, other new lesions, no LE edema Psychiatric: Pt behavior is normal without agitation  Lab Results  Component Value Date   WBC 10.3 10/26/2019   HGB 12.5 10/26/2019   HCT 37.2 10/26/2019   PLT 208.0 10/26/2019   GLUCOSE 143 (H) 04/29/2020   CHOL 141 04/29/2020   TRIG 198.0 (H) 04/29/2020   HDL 37.50 (L) 04/29/2020   LDLDIRECT 154.0 10/26/2019   LDLCALC 63 04/29/2020   ALT 42 (H) 04/29/2020   AST 47 (H) 04/29/2020   NA 136 04/29/2020   K 3.9 04/29/2020   CL 99 04/29/2020   CREATININE 0.93 04/29/2020   BUN 20 04/29/2020   CO2 27 04/29/2020   TSH 1.51 10/26/2019   HGBA1C 8.1 (H) 04/29/2020   MICROALBUR 0.8 10/26/2019      Assessment  & Plan:

## 2020-05-06 NOTE — Assessment & Plan Note (Addendum)
Uncontrolled, pt declines med change, will get back to DM diet and more active as before, instead of increased meds for now  I spent 31 minutes in preparing to see the patient by review of recent labs, imaging and procedures, obtaining and reviewing separately obtained history, communicating with the patient and family or caregiver, ordering medications, tests or procedures, and documenting clinical information in the EHR including the differential Dx, treatment, and any further evaluation and other management of dm, htn, hld

## 2020-05-30 IMAGING — US US THYROID
1 series · 12 of 25 positions shown · non-contrast
Comparison: 09/01/2017, 09/16/2015, 11/02/2011, 11/04/2010,
04/07/2009 and 07/11/2008

CLINICAL DATA: Prior ultrasound follow-up. History multinodular
thyroid goiter with prior biopsy of 4.3 cm right inferior thyroid
nodule on 07/30/2008 demonstrating benign cytology and a 2.4 cm left
mid thyroid nodule on 10/01/2015 demonstrating benign cytology.

EXAM:
THYROID ULTRASOUND
TECHNIQUE: Ultrasound examination of the thyroid gland and adjacent soft
tissues was performed.

[Series 1: us thyroid · 0.07mm/px · 12 of 50 slices shown]
[im 3/50]
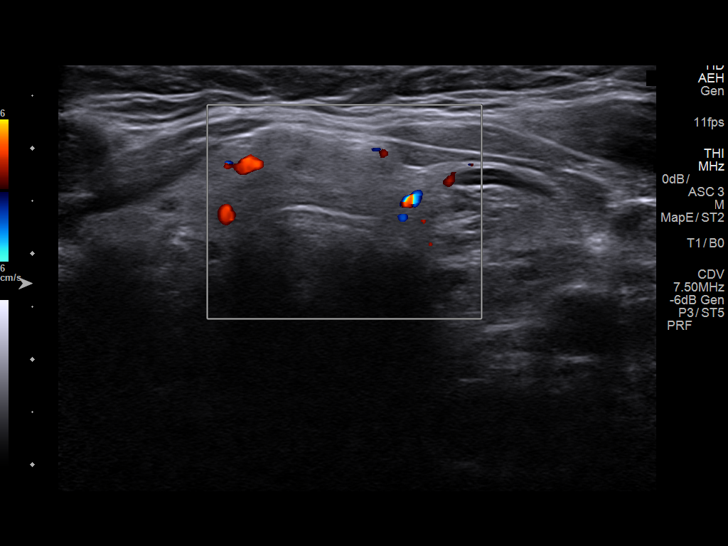
[im 7/50]
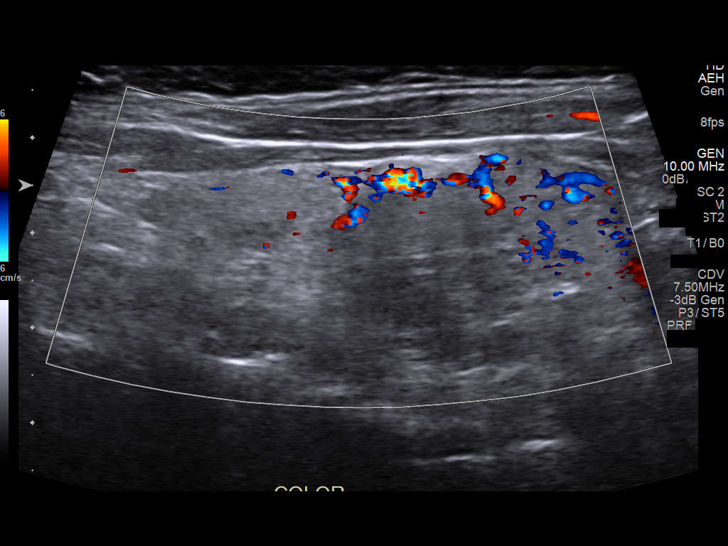
[im 11/50]
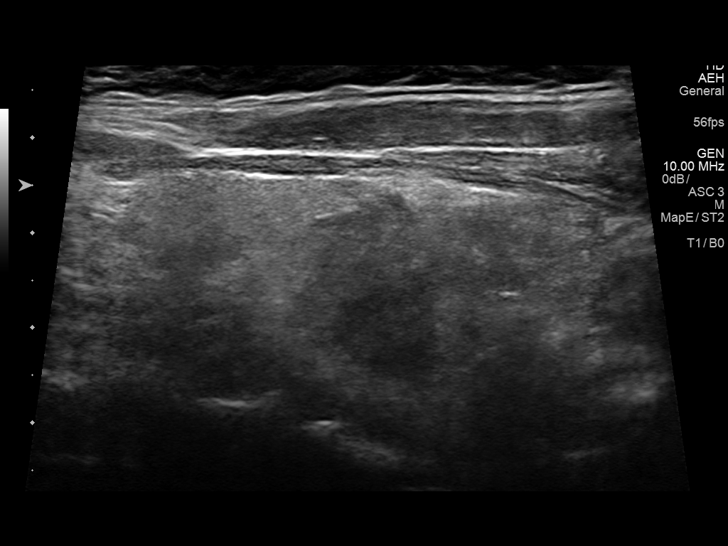
[im 15/50]
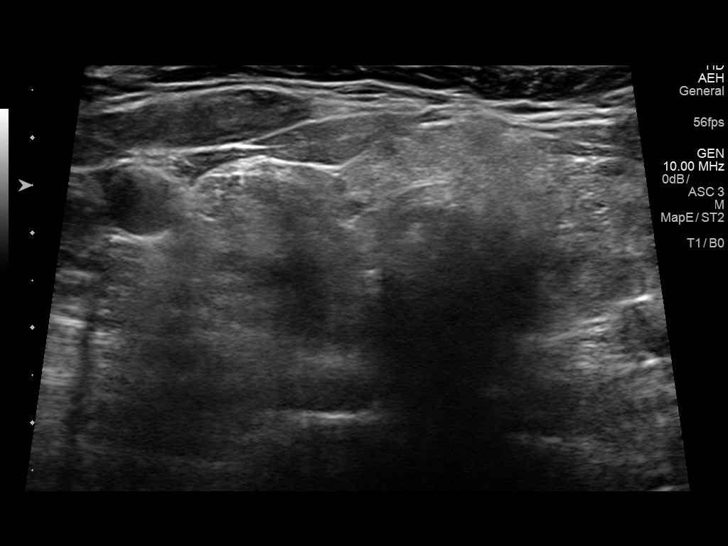
[im 19/50]
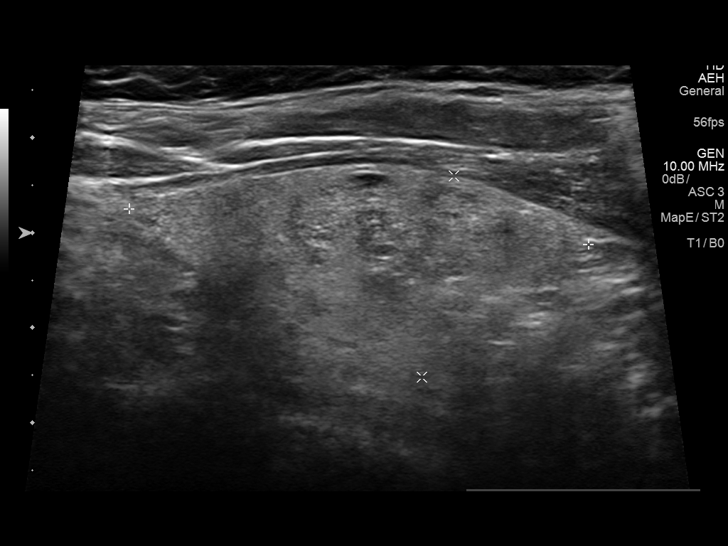
[im 23/50]
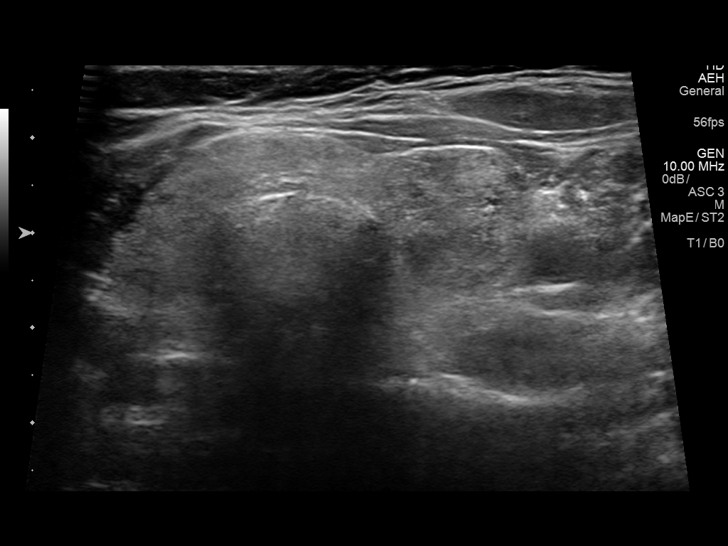
[im 27/50]
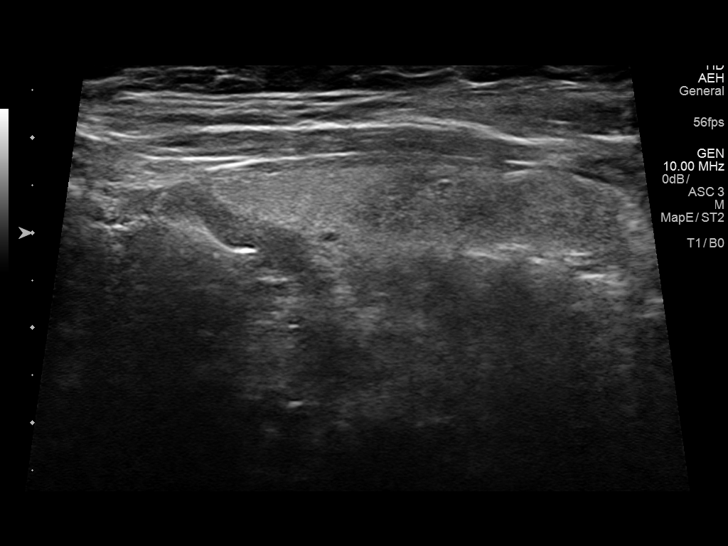
[im 31/50]
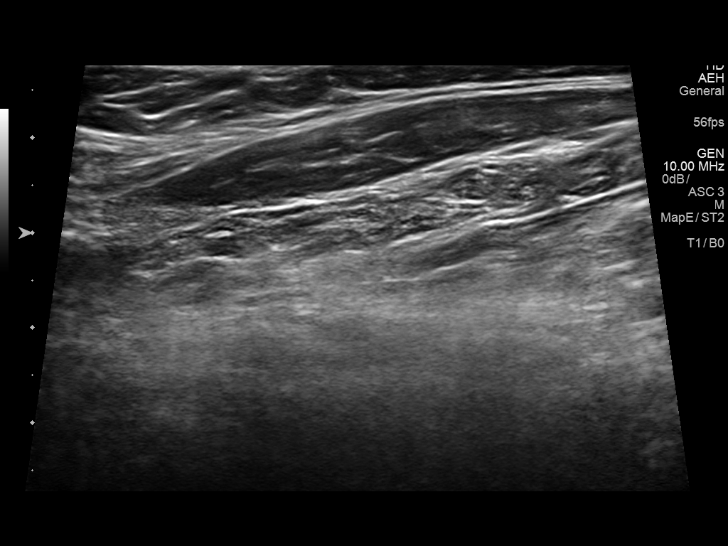
[im 35/50]
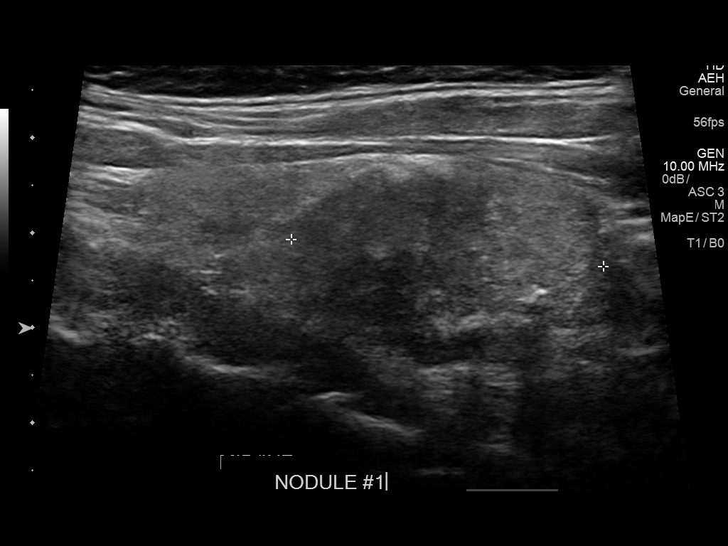
[im 39/50]
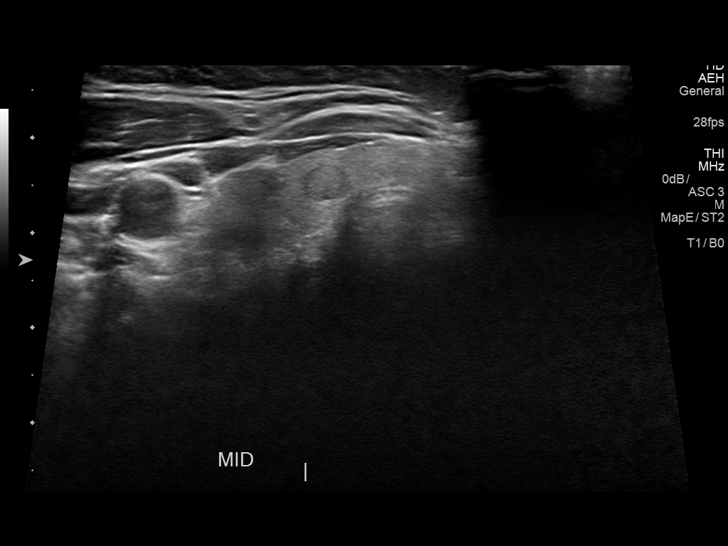
[im 43/50]
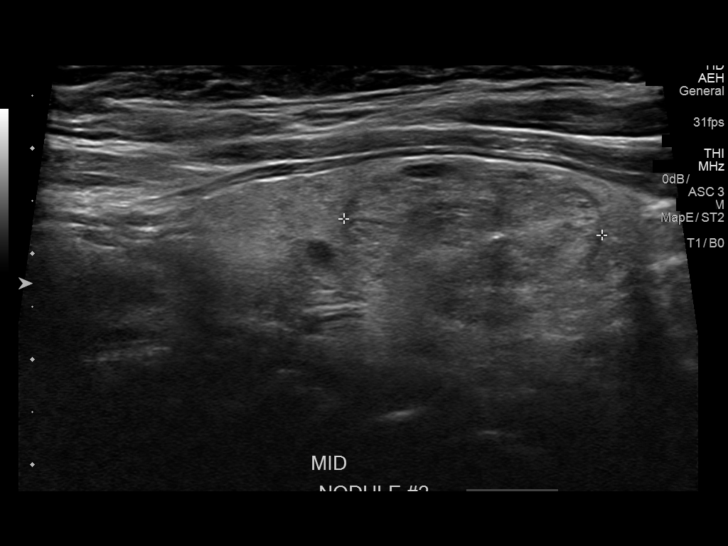
[im 47/50]
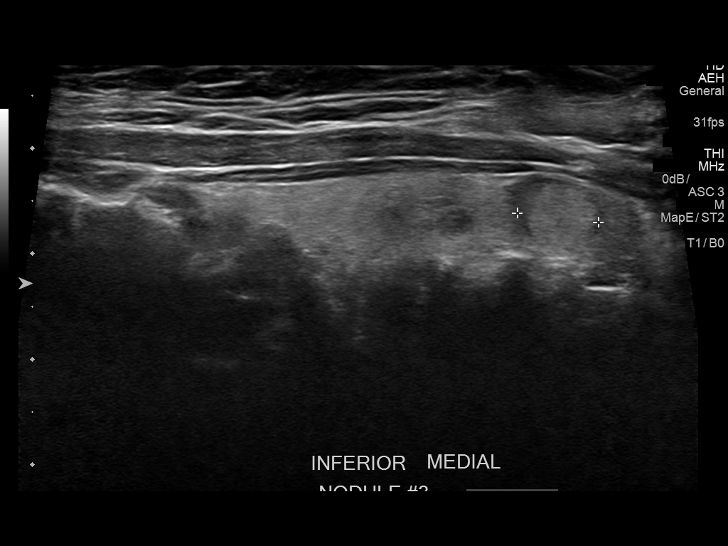

[12 of 25 positions shown; findings below may reference images not displayed]

FINDINGS: Parenchymal Echotexture: Mildly heterogenous

Isthmus: 0.7 cm

Right lobe: 6.1 x 2.5 x 1.9 cm

Left lobe: 4.9 x 2.1 x 1.6 cm

_________________________________________________________

Estimated total number of nodules >/= 1 cm: 2

Number of spongiform nodules >/=  2 cm not described below (TR1): 0

Number of mixed cystic and solid nodules >/= 1.5 cm not described
below (TR2): 0

_________________________________________________________

Nodule # 1:

Prior biopsy: Yes

Location: Right; Inferior

Maximum size: 3.3 cm; Other 2 dimensions: 1.8 x 1.8 cm, previously,
3.3 cm

Composition: solid/almost completely solid (2)

Echogenicity: isoechoic (1)

Shape: not taller-than-wide (0)

Margins: ill-defined (0)

Echogenic foci: none (0)

ACR TI-RADS total points: 3.

ACR TI-RADS risk category:  TR3 (3 points).

Significant change in size (>/= 20% in two dimensions and minimal
increase of 2 mm): No

Change in features: No

Change in ACR TI-RADS risk category: No

ACR TI-RADS recommendations:

Stable right thyroid nodule. No change in size or appearance since
the prior study. This nodule has been previously sampled in 8998 and
was also previously larger in 5788 and older studies.

_________________________________________________________

Nodule # 2:

Prior biopsy: Yes

Location: Left; Mid

Maximum size: 2.5 cm; Other 2 dimensions: 1.5 x 1.5 cm, previously,
2.4 cm

Composition: solid/almost completely solid (2)

Echogenicity: isoechoic (1)

Shape: not taller-than-wide (0)

Margins: ill-defined (0)

Echogenic foci: none (0)

ACR TI-RADS total points: 3.

ACR TI-RADS risk category:  TR3 (3 points).

Significant change in size (>/= 20% in two dimensions and minimal
increase of 2 mm): No

Change in features: No

Change in ACR TI-RADS risk category: No

ACR TI-RADS recommendations:

Stable left mid thyroid nodule. This nodule was previously sampled
in 6760.

_________________________________________________________

Nodule # 3:

Prior biopsy: No

Location: Left; Inferior

Maximum size: 0.8 cm; Other 2 dimensions: 0.6 x 0.6 cm, previously,
0.8 cm

Composition: solid/almost completely solid (2)

Echogenicity: isoechoic (1)

Shape: not taller-than-wide (0)

Margins: ill-defined (0)

Echogenic foci: none (0)

ACR TI-RADS total points: 3.

ACR TI-RADS risk category:  TR3 (3 points).

Significant change in size (>/= 20% in two dimensions and minimal
increase of 2 mm): No

Change in features: No

Change in ACR TI-RADS risk category: No

ACR TI-RADS recommendations:

Given size (<1.4 cm) and appearance, this nodule does NOT meet
TI-RADS criteria for biopsy or dedicated follow-up.

_________________________________________________________

No abnormal lymph nodes identified.
IMPRESSION: Stable bilateral thyroid nodules. Previously sampled right inferior
and left mid thyroid nodules are stable. A 0.8 left inferior thyroid
nodule is stable and does not meet criteria for biopsy or dedicated
follow-up.

The above is in keeping with the ACR TI-RADS recommendations - [HOSPITAL] 6760;[DATE].

## 2020-07-29 ENCOUNTER — Other Ambulatory Visit (INDEPENDENT_AMBULATORY_CARE_PROVIDER_SITE_OTHER): Payer: 59

## 2020-07-29 ENCOUNTER — Other Ambulatory Visit: Payer: Self-pay

## 2020-07-29 DIAGNOSIS — E119 Type 2 diabetes mellitus without complications: Secondary | ICD-10-CM | POA: Diagnosis not present

## 2020-07-29 LAB — LIPID PANEL
Cholesterol: 133 mg/dL (ref 0–200)
HDL: 45 mg/dL (ref >39.00)
LDL Cholesterol: 64 mg/dL (ref 0–99)
NonHDL: 87.85
Total CHOL/HDL Ratio: 3
Triglycerides: 118 mg/dL (ref 0.0–149.0)
VLDL: 23.6 mg/dL (ref 0.0–40.0)

## 2020-07-29 LAB — HEMOGLOBIN A1C: Hgb A1c MFr Bld: 6.8 % — ABNORMAL HIGH (ref 4.6–6.5)

## 2020-07-29 LAB — COMPREHENSIVE METABOLIC PANEL
ALT: 28 U/L (ref 0–35)
AST: 21 U/L (ref 0–37)
Albumin: 4.5 g/dL (ref 3.5–5.2)
Alkaline Phosphatase: 74 U/L (ref 39–117)
BUN: 19 mg/dL (ref 6–23)
CO2: 27 mEq/L (ref 19–32)
Calcium: 9.5 mg/dL (ref 8.4–10.5)
Chloride: 101 mEq/L (ref 96–112)
Creatinine, Ser: 0.83 mg/dL (ref 0.40–1.20)
GFR: 74.49 mL/min (ref >60.00)
Glucose, Bld: 112 mg/dL — ABNORMAL HIGH (ref 70–99)
Potassium: 4.1 mEq/L (ref 3.5–5.1)
Sodium: 136 mEq/L (ref 135–145)
Total Bilirubin: 0.4 mg/dL (ref 0.2–1.2)
Total Protein: 7 g/dL (ref 6.0–8.3)

## 2020-07-29 NOTE — Addendum Note (Signed)
Addended by: Boris Lown B on: 07/29/2020 09:04 AM   Modules accepted: Orders

## 2020-08-06 ENCOUNTER — Other Ambulatory Visit: Payer: Self-pay

## 2020-08-06 ENCOUNTER — Ambulatory Visit: Payer: 59 | Admitting: Internal Medicine

## 2020-08-06 ENCOUNTER — Encounter: Payer: Self-pay | Admitting: Internal Medicine

## 2020-08-06 VITALS — BP 130/80 | HR 58 | Temp 97.8°F | Ht 61.0 in | Wt 170.0 lb

## 2020-08-06 DIAGNOSIS — I10 Essential (primary) hypertension: Secondary | ICD-10-CM | POA: Diagnosis not present

## 2020-08-06 DIAGNOSIS — E1165 Type 2 diabetes mellitus with hyperglycemia: Secondary | ICD-10-CM

## 2020-08-06 DIAGNOSIS — R8290 Unspecified abnormal findings in urine: Secondary | ICD-10-CM | POA: Insufficient documentation

## 2020-08-06 DIAGNOSIS — E7849 Other hyperlipidemia: Secondary | ICD-10-CM

## 2020-08-06 LAB — URINALYSIS, ROUTINE W REFLEX MICROSCOPIC
Bilirubin Urine: NEGATIVE
Hgb urine dipstick: NEGATIVE
Ketones, ur: NEGATIVE
Leukocytes,Ua: NEGATIVE
Nitrite: NEGATIVE
RBC / HPF: NONE SEEN (ref 0–?)
Specific Gravity, Urine: <=1.005 — AB (ref 1.000–1.030)
Total Protein, Urine: NEGATIVE
Urine Glucose: NEGATIVE
Urobilinogen, UA: 0.2 (ref 0.0–1.0)
WBC, UA: NONE SEEN (ref 0–?)
pH: 6.5 (ref 5.0–8.0)

## 2020-08-06 NOTE — Patient Instructions (Signed)
Please continue all other medications as before, and refills have been done if requested.  Please have the pharmacy call with any other refills you may need.  Please continue your efforts at being more active, low cholesterol diet, and weight control.  You are otherwise up to date with prevention measures today.  Please keep your appointments with your specialists as you may have planned  Please go to the LAB at the blood drawing area for the tests to be done - just the urine testing today  You will be contacted by phone if any changes need to be made immediately.  Otherwise, you will receive a letter about your results with an explanation, but please check with MyChart first.  Please remember to sign up for MyChart if you have not done so, as this will be important to you in the future with finding out test results, communicating by private email, and scheduling acute appointments online when needed.  Please make an Appointment to return in 6 months, or sooner if needed, also with Lab Appointment for testing done 3-5 days before at the [facility] (so this is for TWO appointments - please see the scheduling desk as you leave)

## 2020-08-06 NOTE — Progress Notes (Signed)
Subjective:    Patient ID: [name] [name], female    DOB: 1956-06-19, 64 y.o.   MRN: [id number]  HPI  Here to f/u; overall doing ok,  Pt denies chest pain, increasing sob or doe, wheezing, orthopnea, PND, increased LE swelling, palpitations, dizziness or syncope.  Pt denies new neurological symptoms such as new headache, or facial or extremity weakness or numbness.  Pt denies polydipsia, polyuria, or low sugar episode.  Pt states overall good compliance with meds, mostly trying to follow appropriate diet, with wt overall stable,  but little exercise however. Denies urinary symptoms such as dysuria, frequency, urgency, flank pain, hematuria or n/v, fever, chills, but does have signficant urinary odor and asking for r/o uti. Wt Readings from Last 3 Encounters:  08/06/20 170 lb (77.1 kg)  05/06/20 174 lb 12.8 oz (79.3 kg)  11/06/19 172 lb 9.6 oz (78.3 kg)   Past Medical History:  Diagnosis Date   ALLERGIC RHINITIS 04/06/2007   ANXIETY 04/06/2007   Arthritis    Cataract    CIN I (cervical intraepithelial neoplasia I)    COLONIC POLYPS 08/04/2007   Diabetes mellitus without complication ([place])    DIARRHEA, ACUTE 09/23/2010   Fibroid    GASTRIC POLYP 08/04/2007   GERD 08/04/2007   GOITER, MULTINODULAR 03/10/2009   Headache(784.0) 11/13/2008   Hx of blood clots    HYPERLIPIDEMIA 04/06/2007   HYPERTENSION 04/06/2007   ISCHEMIC COLITIS, HX OF 16/38/4665   Lichen sclerosus 9935   Osteopenia 08/2018   T score -1.1 FRAX 7% / 0.5%   Palpitations 03/10/2009   Rosacea 11/30/2007   THYROID NODULE, LEFT 11/04/2010   Urinary frequency    UTI'S, RECURRENT 11/30/2007   Past Surgical History:  Procedure Laterality Date   APPENDECTOMY  2005   bladder tack     COLPOSCOPY     CYSTOSCOPY  2007   DERMOID CYST  EXCISION  1994   right ovary   FLEXIBLE SIGMOIDOSCOPY  11/16/95   Lt. pectoral/subclavicular abscess drainage  2006   removed skull cyst  1973   right foot neuroma   1988   uterine fibroids removed  1996    reports that she has never smoked. She has never used smokeless tobacco. She reports current alcohol use. She reports that she does not use drugs. family history includes Breast cancer in her paternal aunt; Deep vein thrombosis in her brother; Diabetes in her mother; Emphysema in her father; Heart disease in her father and mother; Hypertension in her mother; Lung cancer in her maternal grandmother, maternal uncle, and mother; Macular degeneration in her brother; Stroke in her brother and father. Allergies  Allergen Reactions   Atorvastatin     REACTION: extreme muscle aches   Cefuroxime Axetil     REACTION: DIARRHEA   Ciprofloxacin     REACTION: diarrhea   Latex     REACTION: rash   Lipitor [Atorvastatin Calcium] Other (See Comments)    sensitive   Moxifloxacin     REACTION: DIARRHEA   Current Outpatient Medications on File Prior to Visit  Medication Sig Dispense Refill   acetaminophen (TYLENOL) 500 MG tablet Take 500 mg by mouth every 6 (six) hours as needed.     Azelastine-Fluticasone (DYMISTA NA) Place into the nose.     Blood Glucose Monitoring Suppl (ONE TOUCH ULTRA 2) W/DEVICE KIT Use as directed 1 per day 1 each 0   Cetirizine HCl (ZYRTEC PO) Take 10 mg by mouth at bedtime.  Cholecalciferol (VITAMIN D3 PO) Take by mouth.     Cranberry 1000 MG CAPS Take by mouth 2 (two) times daily.     Emollient (COLLAGEN EX) Take by mouth daily.     GLUCOSAMINE-CHONDROITIN PO Take by mouth.     glucose blood test strip Use as instructed 100 each 12   hydrochlorothiazide (HYDRODIURIL) 25 MG tablet Take 1 tablet (25 mg total) by mouth daily. 90 tablet 3   Lancets MISC Use as directed 1 per day 100 each 12   losartan (COZAAR) 100 MG tablet Take 1 tablet (100 mg total) by mouth daily. 90 tablet 3   magnesium 30 MG tablet Take 30 mg by mouth 1 day or 1 dose.     Multiple Vitamins-Minerals (MULTIVITAMIN PO) Take by mouth daily.      omeprazole (PRILOSEC) 20 MG capsule Take 20 mg by mouth daily.     potassium chloride SA (KLOR-CON) 20 MEQ tablet Take 1 tablet (20 mEq total) by mouth 2 (two) times daily. Must keep scheduled appt for future refills 180 tablet 3   Probiotic Product (PROBIOTIC PO) Take by mouth.     rosuvastatin (CRESTOR) 40 MG tablet Take 1 tablet (40 mg total) by mouth every other day. 45 tablet 3   trimethoprim (TRIMPEX) 100 MG tablet Take 100 mg by mouth 2 (two) times daily.     vitamin B-12 (CYANOCOBALAMIN) 500 MCG tablet Take 500 mcg by mouth daily.     No current facility-administered medications on file prior to visit.   Review of Systems All otherwise neg per pt    Objective:   Physical Exam BP 130/80 (BP Location: Left Arm, Patient Position: Sitting, Cuff Size: Large)    Pulse (!) 58    Temp 97.8 F (36.6 C) (Oral)    Ht 5' 1"  (1.549 m)    Wt 170 lb (77.1 kg)    SpO2 97%    BMI 32.12 kg/m  VS noted,  Constitutional: Pt appears in NAD HENT: Head: NCAT.  Right Ear: External ear normal.  Left Ear: External ear normal.  Eyes: . Pupils are equal, round, and reactive to light. Conjunctivae and EOM are normal Nose: without d/c or deformity Neck: Neck supple. Gross normal ROM Cardiovascular: Normal rate and regular rhythm.   Pulmonary/Chest: Effort normal and breath sounds without rales or wheezing.  Abd:  Soft, NT, ND, + BS, no organomegaly Neurological: Pt is alert. At baseline orientation, motor grossly intact Skin: Skin is warm. No rashes, other new lesions, no LE edema Psychiatric: Pt behavior is normal without agitation  All otherwise neg per pt Lab Results  Component Value Date   WBC 10.3 10/26/2019   HGB 12.5 10/26/2019   HCT 37.2 10/26/2019   PLT 208.0 10/26/2019   GLUCOSE 112 (H) 07/29/2020   CHOL 133 07/29/2020   TRIG 118.0 07/29/2020   HDL 45.00 07/29/2020   LDLDIRECT 154.0 10/26/2019   LDLCALC 64 07/29/2020   ALT 28 07/29/2020   AST 21 07/29/2020   NA 136  07/29/2020   K 4.1 07/29/2020   CL 101 07/29/2020   CREATININE 0.83 07/29/2020   BUN 19 07/29/2020   CO2 27 07/29/2020   TSH 1.51 10/26/2019   HGBA1C 6.8 (H) 07/29/2020   MICROALBUR 0.8 10/26/2019      Assessment & Plan:

## 2020-08-07 LAB — URINE CULTURE: Result:: NO GROWTH

## 2020-08-08 ENCOUNTER — Encounter: Payer: Self-pay | Admitting: Internal Medicine

## 2020-08-09 ENCOUNTER — Encounter: Payer: Self-pay | Admitting: Internal Medicine

## 2020-08-09 NOTE — Assessment & Plan Note (Signed)
stable overall by history and exam, recent data reviewed with pt, and pt to continue medical treatment as before,  to f/u any worsening symptoms or concerns  

## 2020-08-09 NOTE — Assessment & Plan Note (Signed)
Cant r/o uti - for urine studies, tx pending results

## 2020-08-18 ENCOUNTER — Encounter: Payer: Self-pay | Admitting: Obstetrics and Gynecology

## 2020-08-18 ENCOUNTER — Ambulatory Visit: Payer: 59 | Admitting: Obstetrics and Gynecology

## 2020-08-18 ENCOUNTER — Other Ambulatory Visit: Payer: Self-pay

## 2020-08-18 VITALS — BP 132/78 | Ht 61.0 in | Wt 170.0 lb

## 2020-08-18 DIAGNOSIS — Z01419 Encounter for gynecological examination (general) (routine) without abnormal findings: Secondary | ICD-10-CM

## 2020-08-18 DIAGNOSIS — M8580 Other specified disorders of bone density and structure, unspecified site: Secondary | ICD-10-CM

## 2020-08-18 DIAGNOSIS — N898 Other specified noninflammatory disorders of vagina: Secondary | ICD-10-CM | POA: Diagnosis not present

## 2020-08-18 DIAGNOSIS — L280 Lichen simplex chronicus: Secondary | ICD-10-CM | POA: Diagnosis not present

## 2020-08-18 LAB — WET PREP FOR TRICH, YEAST, CLUE

## 2020-08-18 NOTE — Progress Notes (Signed)
[name] [name] [birth year] [id number]  SUBJECTIVE:  64 y.o. G0P0 female for annual routine gynecologic exam. Has noted possible slight vaginal discharge and odor.  Does have slight urinary incontinence chronically but had a prior sling procedure which really helped to reduce that.  No other urinary symptoms. She has no other gynecologic concerns.  Current Outpatient Medications  Medication Sig Dispense Refill  . acetaminophen (TYLENOL) 500 MG tablet Take 500 mg by mouth every 6 (six) hours as needed.    . Azelastine-Fluticasone (DYMISTA NA) Place into the nose.    . Blood Glucose Monitoring Suppl (ONE TOUCH ULTRA 2) W/DEVICE KIT Use as directed 1 per day 1 each 0  . Cetirizine HCl (ZYRTEC PO) Take 10 mg by mouth at bedtime.     . Cholecalciferol (VITAMIN D3 PO) Take by mouth.    . Cranberry 1000 MG CAPS Take by mouth 2 (two) times daily.    . Emollient (COLLAGEN EX) Take by mouth daily.    Marland Kitchen GLUCOSAMINE-CHONDROITIN PO Take by mouth.    Marland Kitchen glucose blood test strip Use as instructed 100 each 12  . hydrochlorothiazide (HYDRODIURIL) 25 MG tablet Take 1 tablet (25 mg total) by mouth daily. 90 tablet 3  . Lancets MISC Use as directed 1 per day 100 each 12  . losartan (COZAAR) 100 MG tablet Take 1 tablet (100 mg total) by mouth daily. 90 tablet 3  . magnesium 30 MG tablet Take 30 mg by mouth 1 day or 1 dose.    . Multiple Vitamins-Minerals (MULTIVITAMIN PO) Take by mouth daily.    Marland Kitchen omeprazole (PRILOSEC) 20 MG capsule Take 20 mg by mouth daily.    . potassium chloride SA (KLOR-CON) 20 MEQ tablet Take 1 tablet (20 mEq total) by mouth 2 (two) times daily. Must keep scheduled appt for future refills 180 tablet 3  . Probiotic Product (PROBIOTIC PO) Take by mouth.    . rosuvastatin (CRESTOR) 40 MG tablet Take 1 tablet (40 mg total) by mouth every other day. 45 tablet 3  . trimethoprim (TRIMPEX) 100 MG tablet Take 100 mg by mouth 2 (two) times daily.    . vitamin B-12 (CYANOCOBALAMIN) 500 MCG tablet  Take 500 mcg by mouth daily.     No current facility-administered medications for this visit.   Allergies: Atorvastatin, Cefuroxime axetil, Ciprofloxacin, Latex, Lipitor [atorvastatin calcium], and Moxifloxacin  No LMP recorded. Patient is postmenopausal.  Past medical history,surgical history, problem list, medications, allergies, family history and social history were all reviewed and documented as reviewed in the EPIC chart.  ROS: Pertinent positives and negatives as reviewed in HPI     OBJECTIVE:  BP 132/78   Ht 5' 1"  (1.549 m)   Wt 170 lb (77.1 kg)   BMI 32.12 kg/m  The patient appears well, alert, oriented, in no distress. ENT normal.  Neck supple. No cervical or supraclavicular adenopathy or thyromegaly.  Lungs are clear, good air entry, no wheezes, rhonchi or rales. S1 and S2 normal, no murmurs, regular rate and rhythm.  Abdomen soft without tenderness, guarding, mass or organomegaly.  Neurological is normal, no focal findings.  BREAST EXAM: breasts appear normal, no suspicious masses, no skin or nipple changes or axillary nodes  PELVIC EXAM: VULVA: normal appearing vulva with atrophic change, no masses, tenderness or lesions, VAGINA: normal appearing vagina with trophic change, normal color and scant yellow discharge, no lesions, CERVIX: normal appearing cervix without discharge or lesions, UTERUS: uterus is normal size, shape, consistency and nontender,  ADNEXA: normal adnexa in size, nontender and no masses, WET MOUNT done - results: negative for pathogens, normal epithelial cells  Chaperone: Thurnell Garbe present during the examination  ASSESSMENT:  64 y.o. G0P0 here for annual gynecologic exam  PLAN:   1. Postmenopausal.  No significant hot flashes or night sweats.  No vaginal bleeding.  Vaginal wet mount negative.  Discussed timed bladder voids to limit leakage if concerned about odor.  None noted on exam today. 2. Pap smear/HPV 2020.  History of CIN-1 in 1994,  normal Pap smears since. 3. History of lichen simplex chronicus.  Vulvar changes on exam consistent with this diagnosis, no concerning lesions.  Not currently using any topical treatments and is asymptomatic. 4. Mammogram 09/2019.  Normal breast exam today.  She is reminded to schedule an annual mammogram when due. 5. Colonoscopy 2018.  She will follow up at the recommended interval.   6. Osteopenia. DEXA 08/2018 with T score -1.1, FRAX 7% / 2.5%.  Given mild extent, would recommend repeating in another 1 to 2 years.  Discussed vitamin D and calcium and weightbearing exercise.   7. Health maintenance.  No labs today as she normally has these completed with her primary care doctor.  Return annually or sooner, prn.  Joseph Pierini MD 08/18/20

## 2020-08-28 ENCOUNTER — Other Ambulatory Visit: Payer: Self-pay | Admitting: Internal Medicine

## 2020-08-28 ENCOUNTER — Other Ambulatory Visit: Payer: Self-pay | Admitting: Obstetrics and Gynecology

## 2020-08-28 DIAGNOSIS — Z1231 Encounter for screening mammogram for malignant neoplasm of breast: Secondary | ICD-10-CM

## 2020-09-29 ENCOUNTER — Other Ambulatory Visit: Payer: 59

## 2020-09-29 DIAGNOSIS — Z20822 Contact with and (suspected) exposure to covid-19: Secondary | ICD-10-CM

## 2020-09-30 LAB — SARS-COV-2, NAA 2 DAY TAT

## 2020-09-30 LAB — NOVEL CORONAVIRUS, NAA: SARS-CoV-2, NAA: NOT DETECTED

## 2020-10-17 ENCOUNTER — Ambulatory Visit
Admission: RE | Admit: 2020-10-17 | Discharge: 2020-10-17 | Disposition: A | Discharge status: Home / Self Care | Payer: 59 | Source: Ambulatory Visit | Attending: Internal Medicine | Admitting: Internal Medicine

## 2020-10-17 ENCOUNTER — Other Ambulatory Visit: Payer: Self-pay

## 2020-10-17 DIAGNOSIS — Z1231 Encounter for screening mammogram for malignant neoplasm of breast: Secondary | ICD-10-CM

## 2020-11-10 ENCOUNTER — Telehealth: Payer: Self-pay | Admitting: Internal Medicine

## 2020-11-10 DIAGNOSIS — E1165 Type 2 diabetes mellitus with hyperglycemia: Secondary | ICD-10-CM

## 2020-11-10 DIAGNOSIS — E538 Deficiency of other specified B group vitamins: Secondary | ICD-10-CM

## 2020-11-10 DIAGNOSIS — E559 Vitamin D deficiency, unspecified: Secondary | ICD-10-CM

## 2020-11-10 NOTE — Telephone Encounter (Signed)
Patient is requesting lab work before her 3.28.22 CPE. Please advise

## 2020-11-11 NOTE — Telephone Encounter (Signed)
Okay to have labs prior?

## 2020-11-11 NOTE — Telephone Encounter (Signed)
Ok labs done 

## 2020-11-12 NOTE — Telephone Encounter (Signed)
Dr Jenny Reichmann has okayed the labs to be done and placed orders. I am back in [place] today and also don't have access to schedule pt.

## 2020-11-13 ENCOUNTER — Other Ambulatory Visit: Payer: Self-pay | Admitting: Internal Medicine

## 2020-11-13 ENCOUNTER — Telehealth: Payer: Self-pay

## 2020-11-13 NOTE — Telephone Encounter (Signed)
Please refill as per office routine med refill policy (all routine meds refilled for 3 mo or monthly per pt preference up to one year from last visit, then month to month grace period for 3 mo, then further med refills will have to be denied)  

## 2020-11-13 NOTE — Telephone Encounter (Signed)
Left pt voicemail to call office back to have lab appt scheduled.

## 2020-11-14 ENCOUNTER — Encounter: Payer: 59 | Admitting: Internal Medicine

## 2020-12-09 ENCOUNTER — Other Ambulatory Visit (INDEPENDENT_AMBULATORY_CARE_PROVIDER_SITE_OTHER): Payer: 59

## 2020-12-09 ENCOUNTER — Other Ambulatory Visit: Payer: Self-pay

## 2020-12-09 DIAGNOSIS — E559 Vitamin D deficiency, unspecified: Secondary | ICD-10-CM

## 2020-12-09 DIAGNOSIS — E1165 Type 2 diabetes mellitus with hyperglycemia: Secondary | ICD-10-CM

## 2020-12-09 DIAGNOSIS — E538 Deficiency of other specified B group vitamins: Secondary | ICD-10-CM

## 2020-12-09 LAB — URINALYSIS, ROUTINE W REFLEX MICROSCOPIC
Bilirubin Urine: NEGATIVE
Hgb urine dipstick: NEGATIVE
Ketones, ur: NEGATIVE
Leukocytes,Ua: NEGATIVE
Nitrite: NEGATIVE
Specific Gravity, Urine: 1.01 (ref 1.000–1.030)
Total Protein, Urine: NEGATIVE
Urine Glucose: NEGATIVE
Urobilinogen, UA: 0.2 (ref 0.0–1.0)
pH: 6 (ref 5.0–8.0)

## 2020-12-09 LAB — BASIC METABOLIC PANEL
BUN: 14 mg/dL (ref 6–23)
CO2: 28 mEq/L (ref 19–32)
Calcium: 9.7 mg/dL (ref 8.4–10.5)
Chloride: 103 mEq/L (ref 96–112)
Creatinine, Ser: 0.71 mg/dL (ref 0.40–1.20)
GFR: 89.61 mL/min (ref >60.00)
Glucose, Bld: 120 mg/dL — ABNORMAL HIGH (ref 70–99)
Potassium: 4.1 mEq/L (ref 3.5–5.1)
Sodium: 140 mEq/L (ref 135–145)

## 2020-12-09 LAB — CBC WITH DIFFERENTIAL/PLATELET
Basophils Absolute: 0.1 10*3/uL (ref 0.0–0.1)
Basophils Relative: 1.4 % (ref 0.0–3.0)
Eosinophils Absolute: 0.2 10*3/uL (ref 0.0–0.7)
Eosinophils Relative: 4 % (ref 0.0–5.0)
HCT: 38.7 % (ref 36.0–46.0)
Hemoglobin: 13.1 g/dL (ref 12.0–15.0)
Lymphocytes Relative: 40.1 % (ref 12.0–46.0)
Lymphs Abs: 2.3 10*3/uL (ref 0.7–4.0)
MCHC: 34 g/dL (ref 30.0–36.0)
MCV: 82.6 fl (ref 78.0–100.0)
Monocytes Absolute: 0.6 10*3/uL (ref 0.1–1.0)
Monocytes Relative: 9.6 % (ref 3.0–12.0)
Neutro Abs: 2.6 10*3/uL (ref 1.4–7.7)
Neutrophils Relative %: 44.9 % (ref 43.0–77.0)
Platelets: 215 10*3/uL (ref 150.0–400.0)
RBC: 4.68 Mil/uL (ref 3.87–5.11)
RDW: 14 % (ref 11.5–15.5)
WBC: 5.7 10*3/uL (ref 4.0–10.5)

## 2020-12-09 LAB — HEPATIC FUNCTION PANEL
ALT: 38 U/L — ABNORMAL HIGH (ref 0–35)
AST: 27 U/L (ref 0–37)
Albumin: 4.7 g/dL (ref 3.5–5.2)
Alkaline Phosphatase: 80 U/L (ref 39–117)
Bilirubin, Direct: 0.1 mg/dL (ref 0.0–0.3)
Total Bilirubin: 0.5 mg/dL (ref 0.2–1.2)
Total Protein: 7.3 g/dL (ref 6.0–8.3)

## 2020-12-09 LAB — LIPID PANEL
Cholesterol: 214 mg/dL — ABNORMAL HIGH (ref 0–200)
HDL: 46 mg/dL (ref >39.00)
NonHDL: 167.93
Total CHOL/HDL Ratio: 5
Triglycerides: 214 mg/dL — ABNORMAL HIGH (ref 0.0–149.0)
VLDL: 42.8 mg/dL — ABNORMAL HIGH (ref 0.0–40.0)

## 2020-12-09 LAB — MICROALBUMIN / CREATININE URINE RATIO
Creatinine,U: 61.9 mg/dL
Microalb Creat Ratio: 1.1 mg/g (ref 0.0–30.0)
Microalb, Ur: <0.7 mg/dL (ref 0.0–1.9)

## 2020-12-09 LAB — HEMOGLOBIN A1C: Hgb A1c MFr Bld: 6.6 % — ABNORMAL HIGH (ref 4.6–6.5)

## 2020-12-09 LAB — VITAMIN D 25 HYDROXY (VIT D DEFICIENCY, FRACTURES): VITD: 65.99 ng/mL (ref 30.00–100.00)

## 2020-12-09 LAB — TSH: TSH: 1.82 u[IU]/mL (ref 0.35–4.50)

## 2020-12-09 LAB — VITAMIN B12: Vitamin B-12: 597 pg/mL (ref 211–911)

## 2020-12-09 LAB — LDL CHOLESTEROL, DIRECT: Direct LDL: 132 mg/dL

## 2020-12-12 ENCOUNTER — Other Ambulatory Visit: Payer: Self-pay

## 2020-12-15 ENCOUNTER — Other Ambulatory Visit: Payer: Self-pay

## 2020-12-15 ENCOUNTER — Encounter: Payer: Self-pay | Admitting: Internal Medicine

## 2020-12-15 ENCOUNTER — Ambulatory Visit (INDEPENDENT_AMBULATORY_CARE_PROVIDER_SITE_OTHER): Payer: 59 | Admitting: Internal Medicine

## 2020-12-15 VITALS — BP 120/72 | HR 80 | Temp 98.2°F | Ht 61.0 in | Wt 164.0 lb

## 2020-12-15 DIAGNOSIS — T466X5A Adverse effect of antihyperlipidemic and antiarteriosclerotic drugs, initial encounter: Secondary | ICD-10-CM

## 2020-12-15 DIAGNOSIS — I10 Essential (primary) hypertension: Secondary | ICD-10-CM

## 2020-12-15 DIAGNOSIS — E042 Nontoxic multinodular goiter: Secondary | ICD-10-CM

## 2020-12-15 DIAGNOSIS — J300 Vasomotor rhinitis: Secondary | ICD-10-CM | POA: Insufficient documentation

## 2020-12-15 DIAGNOSIS — Z23 Encounter for immunization: Secondary | ICD-10-CM

## 2020-12-15 DIAGNOSIS — G720 Drug-induced myopathy: Secondary | ICD-10-CM | POA: Diagnosis not present

## 2020-12-15 DIAGNOSIS — H1045 Other chronic allergic conjunctivitis: Secondary | ICD-10-CM | POA: Insufficient documentation

## 2020-12-15 DIAGNOSIS — E7849 Other hyperlipidemia: Secondary | ICD-10-CM

## 2020-12-15 DIAGNOSIS — E1165 Type 2 diabetes mellitus with hyperglycemia: Secondary | ICD-10-CM

## 2020-12-15 DIAGNOSIS — Z0001 Encounter for general adult medical examination with abnormal findings: Secondary | ICD-10-CM

## 2020-12-15 MED ORDER — VALSARTAN-HYDROCHLOROTHIAZIDE 320-25 MG PO TABS: 1.0000 | ORAL_TABLET | Freq: Every day | ORAL | 3 refills | Status: DC

## 2020-12-15 MED ORDER — LIVALO 4 MG PO TABS: ORAL_TABLET | ORAL | 3 refills | Status: DC

## 2020-12-15 NOTE — Progress Notes (Signed)
Patient ID: [name] [name], female   DOB: [birth year], 65 y.o.   MRN: [id number]         Chief Complaint:: wellness exam and htn, hld, dm, statin myopathy       HPI:  [name] [name] is a 65 y.o. female here for wellness exam; due for shingrix #1, o/w up to date with preventive referrals and immunizations.                          Also diovan hct no longer on backorder, asks to change back.  Pt has myalgis with crestor, states she has found that the insurance covers livaloa and asks for change.  Pt denies chest pain, increased sob or doe, wheezing, orthopnea, PND, increased LE swelling, palpitations, dizziness or syncope.  Denies new focal neuro s/s.   Pt denies polydipsia, polyuria,  Pt denies fever, wt loss, night sweats, loss of appetite, or other constitutional symptoms  Lost 6 lbs with better diet and activity Wt Readings from Last 3 Encounters:  12/15/20 164 lb (74.4 kg)  08/18/20 170 lb (77.1 kg)  08/06/20 170 lb (77.1 kg)   BP Readings from Last 3 Encounters:  12/15/20 120/72  08/18/20 132/78  08/06/20 130/80   Immunization History  Administered Date(s) Administered  . Influenza Split 09/06/2016  . Influenza Whole 06/20/2009, 06/20/2010  . Influenza, High Dose Seasonal PF 12/09/2016, 11/30/2018, 11/29/2019, 11/25/2020  . Influenza,inj,Quad PF,6+ Mos 08/14/2019  . Influenza,inj,Quad PF,6-35 Mos 07/04/2017  . Influenza-Unspecified 05/21/2012, 05/21/2014, 06/25/2015, 07/15/2017  . PFIZER(Purple Top)SARS-COV-2 Vaccination 11/01/2019, 11/22/2019, 07/07/2020  . Pneumococcal Conjugate-13 10/05/2017  . Pneumococcal Polysaccharide-23 08/28/2014, 09/12/2015, 12/09/2016, 11/30/2018, 11/29/2019, 11/25/2020  . Td 03/20/1996, 09/15/2009  . Tdap 05/06/2020  . Zoster Recombinat (Shingrix) 12/15/2020   There are no preventive care reminders to display for this patient.    Past Medical History:  Diagnosis Date  . ALLERGIC RHINITIS 04/06/2007  . ANXIETY 04/06/2007  . Arthritis   .  Cataract   . CIN I (cervical intraepithelial neoplasia I)   . COLONIC POLYPS 08/04/2007  . Diabetes mellitus without complication ([place])   . DIARRHEA, ACUTE 09/23/2010  . Fibroid   . GASTRIC POLYP 08/04/2007  . GERD 08/04/2007  . GOITER, MULTINODULAR 03/10/2009  . Headache(784.0) 11/13/2008  . Hx of blood clots   . HYPERLIPIDEMIA 04/06/2007  . HYPERTENSION 04/06/2007  . ISCHEMIC COLITIS, HX OF 07/06/2007  . Lichen sclerosus 5573  . Osteopenia 08/2018   T score -1.1 FRAX 7% / 0.5%  . Palpitations 03/10/2009  . Rosacea 11/30/2007  . THYROID NODULE, LEFT 11/04/2010  . Urinary frequency   . UTI'S, RECURRENT 11/30/2007   Past Surgical History:  Procedure Laterality Date  . APPENDECTOMY  2005  . bladder tack    . COLPOSCOPY    . CYSTOSCOPY  2007  . DERMOID CYST  EXCISION  1994   right ovary  . FLEXIBLE SIGMOIDOSCOPY  11/16/95  . Lt. pectoral/subclavicular abscess drainage  2006  . removed skull cyst  1973  . right foot neuroma  1988  . uterine fibroids removed  1996    reports that she has never smoked. She has never used smokeless tobacco. She reports current alcohol use. She reports that she does not use drugs. family history includes Breast cancer in her paternal aunt; Deep vein thrombosis in her brother; Diabetes in her mother; Emphysema in her father; Heart disease in her father and mother; Hypertension in her mother; Lung cancer in  her maternal grandmother, maternal uncle, and mother; Macular degeneration in her brother; Stroke in her brother and father. Allergies  Allergen Reactions  . Atorvastatin Other (See Comments)    REACTION: extreme muscle aches  . Cefuroxime Axetil     REACTION: DIARRHEA  . Ciprofloxacin     REACTION: diarrhea  . Crestor [Rosuvastatin] Other (See Comments)    myalgias  . Latex     REACTION: rash  . Lipitor [Atorvastatin Calcium] Other (See Comments)    sensitive  . Moxifloxacin     REACTION: DIARRHEA   Current Outpatient Medications on File Prior  to Visit  Medication Sig Dispense Refill  . acetaminophen (TYLENOL) 500 MG tablet Take 500 mg by mouth every 6 (six) hours as needed.    . Blood Glucose Monitoring Suppl (ONE TOUCH ULTRA 2) W/DEVICE KIT Use as directed 1 per day 1 each 0  . Cetirizine HCl (ZYRTEC PO) Take 10 mg by mouth at bedtime.     . Cholecalciferol (VITAMIN D3 PO) Take by mouth.    . Emollient (COLLAGEN EX) Take by mouth daily.    Marland Kitchen GLUCOSAMINE-CHONDROITIN PO Take by mouth.    Marland Kitchen glucose blood test strip Use as instructed 100 each 12  . Lancets MISC Use as directed 1 per day 100 each 12  . magnesium 30 MG tablet Take 30 mg by mouth 1 day or 1 dose.    Marland Kitchen omeprazole (PRILOSEC) 20 MG capsule Take 20 mg by mouth daily.    . potassium chloride SA (KLOR-CON) 20 MEQ tablet TAKE 1 TABLET BY MOUTH  TWICE DAILY 180 tablet 3  . Probiotic Product (PROBIOTIC PO) Take by mouth.    . vitamin B-12 (CYANOCOBALAMIN) 500 MCG tablet Take 500 mcg by mouth daily.    Marland Kitchen azelastine (ASTELIN) 0.1 % nasal spray SMARTSIG:1-2 Puff(s) Both Nares Twice Daily    . Azelastine-Fluticasone (DYMISTA NA) Place into the nose. (Patient not taking: Reported on 12/15/2020)    . guaiFENesin (MUCINEX) 600 MG 12 hr tablet     . Multiple Vitamins-Minerals (MULTIVITAMIN PO) Take by mouth daily. (Patient not taking: Reported on 12/15/2020)     No current facility-administered medications on file prior to visit.        ROS:  All others reviewed and negative.  Objective        PE:  BP 120/72 (BP Location: Left Arm, Patient Position: Sitting, Cuff Size: Large)   Pulse 80   Temp 98.2 F (36.8 C) (Oral)   Ht 5' 1"  (1.549 m)   Wt 164 lb (74.4 kg)   SpO2 95%   BMI 30.99 kg/m                 Constitutional: Pt appears in NAD               HENT: Head: NCAT.                Right Ear: External ear normal.                 Left Ear: External ear normal.                Eyes: . Pupils are equal, round, and reactive to light. Conjunctivae and EOM are normal                Nose: without d/c or deformity               Neck: Neck supple. Gross normal ROM  Cardiovascular: Normal rate and regular rhythm.                 Pulmonary/Chest: Effort normal and breath sounds without rales or wheezing.                Abd:  Soft, NT, ND, + BS, no organomegaly               Neurological: Pt is alert. At baseline orientation, motor grossly intact               Skin: Skin is warm. No rashes, no other new lesions, LE edema - none               Psychiatric: Pt behavior is normal without agitation   Micro: none  Cardiac tracings I have personally interpreted today:  none  Pertinent Radiological findings (summarize): none   Lab Results  Component Value Date   WBC 5.7 12/09/2020   HGB 13.1 12/09/2020   HCT 38.7 12/09/2020   PLT 215.0 12/09/2020   GLUCOSE 120 (H) 12/09/2020   CHOL 214 (H) 12/09/2020   TRIG 214.0 (H) 12/09/2020   HDL 46.00 12/09/2020   LDLDIRECT 132.0 12/09/2020   LDLCALC 64 07/29/2020   ALT 38 (H) 12/09/2020   AST 27 12/09/2020   NA 140 12/09/2020   K 4.1 12/09/2020   CL 103 12/09/2020   CREATININE 0.71 12/09/2020   BUN 14 12/09/2020   CO2 28 12/09/2020   TSH 1.82 12/09/2020   HGBA1C 6.6 (H) 12/09/2020   MICROALBUR <0.7 12/09/2020   Assessment/Plan:  [name] [name] is a 65 y.o. White or Caucasian [1] female with  has a past medical history of ALLERGIC RHINITIS (04/06/2007), ANXIETY (04/06/2007), Arthritis, Cataract, CIN I (cervical intraepithelial neoplasia I), COLONIC POLYPS (08/04/2007), Diabetes mellitus without complication ([place]), DIARRHEA, ACUTE (09/23/2010), Fibroid, GASTRIC POLYP (08/04/2007), GERD (08/04/2007), GOITER, MULTINODULAR (03/10/2009), Headache(784.0) (11/13/2008), blood clots, HYPERLIPIDEMIA (04/06/2007), HYPERTENSION (04/06/2007), ISCHEMIC COLITIS, HX OF (84/53/6468), Lichen sclerosus (0321), Osteopenia (08/2018), Palpitations (03/10/2009), Rosacea (11/30/2007), THYROID NODULE, LEFT (11/04/2010), Urinary frequency, and  UTI'S, RECURRENT (11/30/2007).  Encounter for well adult exam with abnormal findings Age and sex appropriate education and counseling updated with regular exercise and diet Referrals for preventative services - none needed Immunizations addressed - for shingrix #1 Smoking counseling  - none needed Evidence for depression or other mood disorder - none significant Most recent labs reviewed. I have personally reviewed and have noted: 1) the patient's medical and social history 2) The patient's current medications and supplements 3) The patient's height, weight, and BMI have been recorded in the chart   Statin myopathy Ok to stop the crestor  GOITER, MULTINODULAR Lab Results  Component Value Date   TSH 1.82 12/09/2020   Stable, pt to continue current monitoring   Diabetes Lab Results  Component Value Date   HGBA1C 6.6 (H) 12/09/2020   Stable, pt to continue current medical treatment  - diet   Hyperlipidemia Ok for trial change to livalo Lab Results  Component Value Date   CHOL 214 (H) 12/09/2020   HDL 46.00 12/09/2020   LDLCALC 64 07/29/2020   LDLDIRECT 132.0 12/09/2020   TRIG 214.0 (H) 12/09/2020   CHOLHDL 5 12/09/2020     Essential hypertension Ok to change back to diovan hct  BP Readings from Last 3 Encounters:  12/15/20 120/72  08/18/20 132/78  08/06/20 130/80   Followup: Return in about 6 months (around 06/17/2021).  Cathlean Cower, MD 12/24/2020 9:17 PM [place]  [facility]

## 2020-12-15 NOTE — Patient Instructions (Addendum)
You had the shingles shot #1 today  Ok to change the crestor to the livalo  Please continue all other medications as before, including the change back to the diovan hct  Please have the pharmacy call with any other refills you may need.  Please continue your efforts at being more active, low cholesterol diet, and weight control.  You are otherwise up to date with prevention measures today.  Please keep your appointments with your specialists as you may have planned  Please make an Appointment to return in 6 months, or sooner if needed, also with Lab Appointment for testing done 3-5 days before at the [facility] (so this is for TWO appointments - please see the scheduling desk as you leave)  Due to the ongoing Covid 19 pandemic, our lab now requires an appointment for any labs done at our office.  If you need labs done and do not have an appointment, please call our office ahead of time to schedule before presenting to the lab for your testing.

## 2020-12-17 ENCOUNTER — Other Ambulatory Visit: Payer: Self-pay | Admitting: Internal Medicine

## 2020-12-17 MED ORDER — LOVASTATIN 40 MG PO TABS: 80.0000 mg | ORAL_TABLET | Freq: Every day | ORAL | 3 refills | Status: DC

## 2020-12-24 ENCOUNTER — Encounter: Payer: Self-pay | Admitting: Internal Medicine

## 2020-12-24 DIAGNOSIS — T466X5A Adverse effect of antihyperlipidemic and antiarteriosclerotic drugs, initial encounter: Secondary | ICD-10-CM | POA: Insufficient documentation

## 2020-12-24 DIAGNOSIS — G720 Drug-induced myopathy: Secondary | ICD-10-CM | POA: Insufficient documentation

## 2020-12-24 NOTE — Assessment & Plan Note (Signed)
Lab Results  Component Value Date   TSH 1.82 12/09/2020   Stable, pt to continue current monitoring

## 2020-12-24 NOTE — Assessment & Plan Note (Signed)
Age and sex appropriate education and counseling updated with regular exercise and diet Referrals for preventative services - none needed Immunizations addressed - for shingrix #1 Smoking counseling  - none needed Evidence for depression or other mood disorder - none significant Most recent labs reviewed. I have personally reviewed and have noted: 1) the patient's medical and social history 2) The patient's current medications and supplements 3) The patient's height, weight, and BMI have been recorded in the chart

## 2020-12-24 NOTE — Assessment & Plan Note (Signed)
Ok to stop the crestor

## 2020-12-24 NOTE — Assessment & Plan Note (Addendum)
Ok to change back to diovan hct  BP Readings from Last 3 Encounters:  12/15/20 120/72  08/18/20 132/78  08/06/20 130/80

## 2020-12-24 NOTE — Assessment & Plan Note (Addendum)
Ok for trial change to [employer] Results  Component Value Date   CHOL 214 (H) 12/09/2020   HDL 46.00 12/09/2020   LDLCALC 64 07/29/2020   LDLDIRECT 132.0 12/09/2020   TRIG 214.0 (H) 12/09/2020   CHOLHDL 5 12/09/2020

## 2020-12-24 NOTE — Addendum Note (Signed)
Addended by: Biagio Borg on: 12/24/2020 09:18 PM   Modules accepted: Orders

## 2020-12-24 NOTE — Assessment & Plan Note (Signed)
Lab Results  Component Value Date   HGBA1C 6.6 (H) 12/09/2020   Stable, pt to continue current medical treatment  - diet

## 2021-04-03 ENCOUNTER — Other Ambulatory Visit: Payer: Self-pay

## 2021-04-03 ENCOUNTER — Other Ambulatory Visit (INDEPENDENT_AMBULATORY_CARE_PROVIDER_SITE_OTHER): Payer: 59

## 2021-04-03 ENCOUNTER — Ambulatory Visit (INDEPENDENT_AMBULATORY_CARE_PROVIDER_SITE_OTHER): Payer: 59

## 2021-04-03 DIAGNOSIS — Z23 Encounter for immunization: Secondary | ICD-10-CM

## 2021-04-03 DIAGNOSIS — E1165 Type 2 diabetes mellitus with hyperglycemia: Secondary | ICD-10-CM | POA: Diagnosis not present

## 2021-04-03 DIAGNOSIS — E042 Nontoxic multinodular goiter: Secondary | ICD-10-CM

## 2021-04-03 LAB — LIPID PANEL
Cholesterol: 237 mg/dL — ABNORMAL HIGH (ref 0–200)
HDL: 42.8 mg/dL (ref >39.00)
NonHDL: 194.68
Total CHOL/HDL Ratio: 6
Triglycerides: 318 mg/dL — ABNORMAL HIGH (ref 0.0–149.0)
VLDL: 63.6 mg/dL — ABNORMAL HIGH (ref 0.0–40.0)

## 2021-04-03 LAB — TSH: TSH: 2.16 u[IU]/mL (ref 0.35–5.50)

## 2021-04-03 LAB — HEPATIC FUNCTION PANEL
ALT: 54 U/L — ABNORMAL HIGH (ref 0–35)
AST: 46 U/L — ABNORMAL HIGH (ref 0–37)
Albumin: 4.6 g/dL (ref 3.5–5.2)
Alkaline Phosphatase: 88 U/L (ref 39–117)
Bilirubin, Direct: 0.1 mg/dL (ref 0.0–0.3)
Total Bilirubin: 0.6 mg/dL (ref 0.2–1.2)
Total Protein: 7.6 g/dL (ref 6.0–8.3)

## 2021-04-03 LAB — BASIC METABOLIC PANEL
BUN: 17 mg/dL (ref 6–23)
CO2: 26 mEq/L (ref 19–32)
Calcium: 9.7 mg/dL (ref 8.4–10.5)
Chloride: 102 mEq/L (ref 96–112)
Creatinine, Ser: 0.83 mg/dL (ref 0.40–1.20)
GFR: 74.14 mL/min (ref >60.00)
Glucose, Bld: 146 mg/dL — ABNORMAL HIGH (ref 70–99)
Potassium: 3.4 mEq/L — ABNORMAL LOW (ref 3.5–5.1)
Sodium: 139 mEq/L (ref 135–145)

## 2021-04-03 LAB — T4, FREE: Free T4: 0.72 ng/dL (ref 0.60–1.60)

## 2021-04-03 LAB — LDL CHOLESTEROL, DIRECT: Direct LDL: 150 mg/dL

## 2021-04-03 LAB — HEMOGLOBIN A1C: Hgb A1c MFr Bld: 7 % — ABNORMAL HIGH (ref 4.6–6.5)

## 2021-04-04 ENCOUNTER — Encounter: Payer: Self-pay | Admitting: Internal Medicine

## 2021-04-15 ENCOUNTER — Other Ambulatory Visit: Payer: Self-pay

## 2021-04-15 ENCOUNTER — Ambulatory Visit (INDEPENDENT_AMBULATORY_CARE_PROVIDER_SITE_OTHER): Payer: 59 | Admitting: Otolaryngology

## 2021-04-15 DIAGNOSIS — H6121 Impacted cerumen, right ear: Secondary | ICD-10-CM | POA: Diagnosis not present

## 2021-04-15 NOTE — Progress Notes (Signed)
HPI: [name] [name] is a 65 y.o. female who presents for evaluation of blockage of her right ear..  She has tried using eardrops but this did not seem to help.  Past Medical History:  Diagnosis Date   ALLERGIC RHINITIS 04/06/2007   ANXIETY 04/06/2007   Arthritis    Cataract    CIN I (cervical intraepithelial neoplasia I)    COLONIC POLYPS 08/04/2007   Diabetes mellitus without complication ([place])    DIARRHEA, ACUTE 09/23/2010   Fibroid    GASTRIC POLYP 08/04/2007   GERD 08/04/2007   GOITER, MULTINODULAR 03/10/2009   Headache(784.0) 11/13/2008   Hx of blood clots    HYPERLIPIDEMIA 04/06/2007   HYPERTENSION 04/06/2007   ISCHEMIC COLITIS, HX OF 60/06/9322   Lichen sclerosus 5573   Osteopenia 08/2018   T score -1.1 FRAX 7% / 0.5%   Palpitations 03/10/2009   Rosacea 11/30/2007   THYROID NODULE, LEFT 11/04/2010   Urinary frequency    UTI'S, RECURRENT 11/30/2007   Past Surgical History:  Procedure Laterality Date   APPENDECTOMY  2005   bladder tack     COLPOSCOPY     CYSTOSCOPY  2007   DERMOID CYST  EXCISION  1994   right ovary   FLEXIBLE SIGMOIDOSCOPY  11/16/95   Lt. pectoral/subclavicular abscess drainage  2006   removed skull cyst  1973   right foot neuroma  1988   uterine fibroids removed  1996   Social History   Socioeconomic History   Marital status: Single    Spouse name: Not on file   Number of children: Not on file   Years of education: Not on file   Highest education level: Not on file  Occupational History   Not on file  Tobacco Use   Smoking status: Never   Smokeless tobacco: Never   Tobacco comments:    passive tobacco smoke exposure as a child  Vaping Use   Vaping Use: Never used  Substance and Sexual Activity   Alcohol use: Yes    Alcohol/week: 0.0 standard drinks    Comment: Rare   Drug use: No   Sexual activity: Not Currently    Birth control/protection: Post-menopausal    Comment: 1st intercourse 65 yo-Fewer than 5 partners  Other Topics Concern    Not on file  Social History Narrative   Not on file   Social Determinants of Health   Financial Resource Strain: Not on file  Food Insecurity: Not on file  Transportation Needs: Not on file  Physical Activity: Not on file  Stress: Not on file  Social Connections: Not on file   Family History  Problem Relation Age of Onset   Stroke Brother    Deep vein thrombosis Brother    Macular degeneration Brother    Diabetes Mother    Hypertension Mother    Heart disease Mother    Lung cancer Mother    Lung cancer Maternal Grandmother    Heart disease Father    Stroke Father    Emphysema Father    Breast cancer Paternal Aunt        Age 68's   Lung cancer Maternal Uncle    Colon cancer Neg Hx    Stomach cancer Neg Hx    Rectal cancer Neg Hx    Esophageal cancer Neg Hx    Liver cancer Neg Hx    Allergies  Allergen Reactions   Atorvastatin Other (See Comments)    REACTION: extreme muscle aches   Cefuroxime Axetil  REACTION: DIARRHEA   Ciprofloxacin     REACTION: diarrhea   Crestor [Rosuvastatin] Other (See Comments)    myalgias   Latex     REACTION: rash   Lipitor [Atorvastatin Calcium] Other (See Comments)    sensitive   Moxifloxacin     REACTION: DIARRHEA   Prior to Admission medications   Medication Sig Start Date End Date Taking? Authorizing Provider  acetaminophen (TYLENOL) 500 MG tablet Take 500 mg by mouth every 6 (six) hours as needed.    [provider]  azelastine (ASTELIN) 0.1 % nasal spray SMARTSIG:1-2 Puff(s) Both Nares Twice Daily 11/25/20   [provider]  Azelastine-Fluticasone (DYMISTA NA) Place into the nose. Patient not taking: Reported on 12/15/2020    [provider]  Blood Glucose Monitoring Suppl (ONE TOUCH ULTRA 2) W/DEVICE KIT Use as directed 1 per day 09/12/15   Biagio Borg, MD  Cetirizine HCl (ZYRTEC PO) Take 10 mg by mouth at bedtime.     [provider]  Cholecalciferol (VITAMIN D3 PO) Take by mouth.     [provider]  Emollient (COLLAGEN EX) Take by mouth daily.    [provider]  GLUCOSAMINE-CHONDROITIN PO Take by mouth.    [provider]  glucose blood test strip Use as instructed 09/12/15   Biagio Borg, MD  guaiFENesin [facility]) 600 MG 12 hr tablet     [provider]  Lancets MISC Use as directed 1 per day 09/12/15   Biagio Borg, MD  lovastatin (MEVACOR) 40 MG tablet Take 2 tablets (80 mg total) by mouth at bedtime. 12/17/20   Biagio Borg, MD  magnesium 30 MG tablet Take 30 mg by mouth 1 day or 1 dose.    [provider]  Multiple Vitamins-Minerals (MULTIVITAMIN PO) Take by mouth daily. Patient not taking: Reported on 12/15/2020    [provider]  omeprazole (PRILOSEC) 20 MG capsule Take 20 mg by mouth daily.    [provider]  Pitavastatin Calcium (LIVALO) 4 MG TABS 1 tab by mouth once daily 12/15/20   Biagio Borg, MD  potassium chloride SA (KLOR-CON) 20 MEQ tablet TAKE 1 TABLET BY MOUTH  TWICE DAILY 11/17/20   Biagio Borg, MD  Probiotic Product (PROBIOTIC PO) Take by mouth.    [provider]  valsartan-hydrochlorothiazide (DIOVAN HCT) 320-25 MG tablet Take 1 tablet by mouth daily. 12/15/20   Biagio Borg, MD  vitamin B-12 (CYANOCOBALAMIN) 500 MCG tablet Take 500 mcg by mouth daily.    [provider]     Positive ROS: Otherwise negative  All other systems have been reviewed and were otherwise negative with the exception of those mentioned in the HPI and as above.  Physical Exam: Constitutional: Alert, well-appearing, no acute distress Ears: External ears without lesions or tenderness. Ear canals left ear canal left TM are clear.  Right ear canal reveals debris and wax adjacent to the right TM obstructing the TM.  This was cleaned using suction and hydrogen peroxide as well as alcohol.  After cleaning the ear canal I applied CSF HC powder to the right ear as she has some slight inflammatory  changes.. Nasal: External nose without lesions. Clear nasal passages Oral: Oropharynx clear. Neck: No palpable adenopathy or masses Respiratory: Breathing comfortably  Skin: No facial/neck lesions or rash noted.  Cerumen impaction removal  Date/Time: 04/15/2021 12:08 PM Performed by: Rozetta Nunnery, MD Authorized by: Rozetta Nunnery, MD   Consent:  Consent obtained:  Verbal   Consent given by:  Patient   Risks discussed:  Pain and bleeding Procedure details:    Location:  R ear   Procedure type: suction   Post-procedure details:    Inspection:  TM intact and canal normal   Hearing quality:  Improved   Procedure completion:  Tolerated well, no immediate complications Comments:     Right ear canal with moist cerumen adjacent to the right TM that was cleaned with suction and hydrogen peroxide.  I also applied CSF HC powder to the right ear canal after cleaning the ear canal.  Hearing was better.  Assessment: Right ear cerumen impaction.  Plan: This was cleaned in the office.  With improved hearing. She will follow-up as needed.  Radene Journey, MD

## 2021-05-21 ENCOUNTER — Telehealth: Payer: Self-pay | Admitting: Internal Medicine

## 2021-05-21 DIAGNOSIS — E1165 Type 2 diabetes mellitus with hyperglycemia: Secondary | ICD-10-CM

## 2021-05-21 NOTE — Telephone Encounter (Signed)
Ok other labs ordered

## 2021-05-21 NOTE — Telephone Encounter (Signed)
6 mth f/u with PCP 9/26 Lab appt 9/19  Only order seen is for TSH, unsure if you wanted more labs done.

## 2021-06-08 ENCOUNTER — Other Ambulatory Visit (INDEPENDENT_AMBULATORY_CARE_PROVIDER_SITE_OTHER): Payer: 59

## 2021-06-08 ENCOUNTER — Other Ambulatory Visit: Payer: Self-pay

## 2021-06-08 DIAGNOSIS — E1165 Type 2 diabetes mellitus with hyperglycemia: Secondary | ICD-10-CM

## 2021-06-08 LAB — HEPATIC FUNCTION PANEL
ALT: 37 U/L — ABNORMAL HIGH (ref 0–35)
AST: 29 U/L (ref 0–37)
Albumin: 4.6 g/dL (ref 3.5–5.2)
Alkaline Phosphatase: 70 U/L (ref 39–117)
Bilirubin, Direct: 0.1 mg/dL (ref 0.0–0.3)
Total Bilirubin: 0.5 mg/dL (ref 0.2–1.2)
Total Protein: 7.5 g/dL (ref 6.0–8.3)

## 2021-06-08 LAB — BASIC METABOLIC PANEL
BUN: 15 mg/dL (ref 6–23)
CO2: 28 mEq/L (ref 19–32)
Calcium: 9.8 mg/dL (ref 8.4–10.5)
Chloride: 100 mEq/L (ref 96–112)
Creatinine, Ser: 0.82 mg/dL (ref 0.40–1.20)
GFR: 75.13 mL/min (ref >60.00)
Glucose, Bld: 127 mg/dL — ABNORMAL HIGH (ref 70–99)
Potassium: 3.7 mEq/L (ref 3.5–5.1)
Sodium: 138 mEq/L (ref 135–145)

## 2021-06-08 LAB — LIPID PANEL
Cholesterol: 147 mg/dL (ref 0–200)
HDL: 48.3 mg/dL (ref >39.00)
LDL Cholesterol: 73 mg/dL (ref 0–99)
NonHDL: 98.87
Total CHOL/HDL Ratio: 3
Triglycerides: 130 mg/dL (ref 0.0–149.0)
VLDL: 26 mg/dL (ref 0.0–40.0)

## 2021-06-08 LAB — HEMOGLOBIN A1C: Hgb A1c MFr Bld: 6.4 % (ref 4.6–6.5)

## 2021-06-15 ENCOUNTER — Ambulatory Visit: Payer: 59 | Admitting: Internal Medicine

## 2021-06-15 ENCOUNTER — Encounter: Payer: Self-pay | Admitting: Internal Medicine

## 2021-06-15 ENCOUNTER — Other Ambulatory Visit: Payer: Self-pay

## 2021-06-15 VITALS — BP 130/64 | HR 72 | Temp 97.9°F | Resp 18 | Ht 61.0 in | Wt 176.4 lb

## 2021-06-15 DIAGNOSIS — E669 Obesity, unspecified: Secondary | ICD-10-CM

## 2021-06-15 DIAGNOSIS — E1165 Type 2 diabetes mellitus with hyperglycemia: Secondary | ICD-10-CM

## 2021-06-15 DIAGNOSIS — E7800 Pure hypercholesterolemia, unspecified: Secondary | ICD-10-CM | POA: Diagnosis not present

## 2021-06-15 DIAGNOSIS — Z6832 Body mass index (BMI) 32.0-32.9, adult: Secondary | ICD-10-CM

## 2021-06-15 DIAGNOSIS — I8393 Asymptomatic varicose veins of bilateral lower extremities: Secondary | ICD-10-CM

## 2021-06-15 DIAGNOSIS — I10 Essential (primary) hypertension: Secondary | ICD-10-CM

## 2021-06-15 MED ORDER — LOVASTATIN 40 MG PO TABS: 40.0000 mg | ORAL_TABLET | Freq: Every day | ORAL | 3 refills | Status: DC

## 2021-06-15 NOTE — Assessment & Plan Note (Signed)
For TFts panel,  to f/u any worsening symptoms or concerns

## 2021-06-15 NOTE — Assessment & Plan Note (Signed)
mulitiple bilateral LEs, for vascular surgury referral per pt request

## 2021-06-15 NOTE — Progress Notes (Signed)
Patient ID: [name] [name], female   DOB: Feb 14, 1956, 65 y.o.   MRN: [id number]        Chief Complaint: follow up HTN, HLD and hyperglycemia, leg cramps, obesity.varicose veins       HPI:  [name] [name] is a 65 y.o. female here overall doing ok, has hit a plateua unable to lose wt, asks for "full thyroid panel".  Denies hyper or hypo thyroid symptoms such as voice, skin or hair change.  Works for [employer],  Pt denies chest pain, increased sob or doe, wheezing, orthopnea, PND, increased LE swelling, palpitations, dizziness or syncope.   Pt denies polydipsia, polyuria, or new focal neuro s/s.  Has bilat leg varicose veins and swelling LLE slight more than right, asks for vascular referral.  Also has ongoing intermittent left and right leg cramps for several months that will even wake her up.   Pt denies fever, wt loss, night sweats, loss of appetite, or other constitutional symptoms          Wt Readings from Last 3 Encounters:  06/15/21 176 lb 6.4 oz (80 kg)  12/15/20 164 lb (74.4 kg)  08/18/20 170 lb (77.1 kg)   BP Readings from Last 3 Encounters:  06/15/21 130/64  12/15/20 120/72  08/18/20 132/78         Past Medical History:  Diagnosis Date   ALLERGIC RHINITIS 04/06/2007   ANXIETY 04/06/2007   Arthritis    Cataract    CIN I (cervical intraepithelial neoplasia I)    COLONIC POLYPS 08/04/2007   Diabetes mellitus without complication ([place])    DIARRHEA, ACUTE 09/23/2010   Fibroid    GASTRIC POLYP 08/04/2007   GERD 08/04/2007   GOITER, MULTINODULAR 03/10/2009   Headache(784.0) 11/13/2008   Hx of blood clots    HYPERLIPIDEMIA 04/06/2007   HYPERTENSION 04/06/2007   ISCHEMIC COLITIS, HX OF 49/67/5916   Lichen sclerosus 3846   Osteopenia 08/2018   T score -1.1 FRAX 7% / 0.5%   Palpitations 03/10/2009   Rosacea 11/30/2007   THYROID NODULE, LEFT 11/04/2010   Urinary frequency    UTI'S, RECURRENT 11/30/2007   Past Surgical History:  Procedure Laterality Date   APPENDECTOMY  2005    bladder tack     COLPOSCOPY     CYSTOSCOPY  2007   DERMOID CYST  EXCISION  1994   right ovary   FLEXIBLE SIGMOIDOSCOPY  11/16/95   Lt. pectoral/subclavicular abscess drainage  2006   removed skull cyst  1973   right foot neuroma  1988   uterine fibroids removed  1996    reports that she has never smoked. She has never used smokeless tobacco. She reports current alcohol use. She reports that she does not use drugs. family history includes Breast cancer in her paternal aunt; Deep vein thrombosis in her brother; Diabetes in her mother; Emphysema in her father; Heart disease in her father and mother; Hypertension in her mother; Lung cancer in her maternal grandmother, maternal uncle, and mother; Macular degeneration in her brother; Stroke in her brother and father. Allergies  Allergen Reactions   Atorvastatin Other (See Comments)    REACTION: extreme muscle aches   Cefuroxime Axetil     REACTION: DIARRHEA   Ciprofloxacin     REACTION: diarrhea   Crestor [Rosuvastatin] Other (See Comments)    myalgias   Latex     REACTION: rash   Lipitor [Atorvastatin Calcium] Other (See Comments)    sensitive   Moxifloxacin  REACTION: DIARRHEA   Current Outpatient Medications on File Prior to Visit  Medication Sig Dispense Refill   acetaminophen (TYLENOL) 500 MG tablet Take 500 mg by mouth every 6 (six) hours as needed.     Cholecalciferol (VITAMIN D3 PO) Take by mouth.     co-enzyme Q-10 30 MG capsule Take 100 mg by mouth daily.     guaiFENesin (MUCINEX) 600 MG 12 hr tablet      levocetirizine (XYZAL) 5 MG tablet Take 5 mg by mouth daily.     magnesium 30 MG tablet Take 30 mg by mouth 1 day or 1 dose.     omeprazole (PRILOSEC) 20 MG capsule Take 20 mg by mouth daily.     potassium chloride SA (KLOR-CON) 20 MEQ tablet TAKE 1 TABLET BY MOUTH  TWICE DAILY 180 tablet 3   valsartan-hydrochlorothiazide (DIOVAN HCT) 320-25 MG tablet Take 1 tablet by mouth daily. 90 tablet 3   vitamin B-12  (CYANOCOBALAMIN) 500 MCG tablet Take 500 mcg by mouth daily.     Zinc 50 MG TABS Take 50 mg by mouth daily.     azelastine (ASTELIN) 0.1 % nasal spray SMARTSIG:1-2 Puff(s) Both Nares Twice Daily     No current facility-administered medications on file prior to visit.        ROS:  All others reviewed and negative.  Objective        PE:  BP 130/64   Pulse 72   Temp 97.9 F (36.6 C) (Oral)   Resp 18   Ht 5\' 1"  (1.549 m)   Wt 176 lb 6.4 oz (80 kg)   SpO2 97%   BMI 33.33 kg/m                 Constitutional: Pt appears in NAD               HENT: Head: NCAT.                Right Ear: External ear normal.                 Left Ear: External ear normal.                Eyes: . Pupils are equal, round, and reactive to light. Conjunctivae and EOM are normal               Nose: without d/c or deformity               Neck: Neck supple. Gross normal ROM               Cardiovascular: Normal rate and regular rhythm.                 Pulmonary/Chest: Effort normal and breath sounds without rales or wheezing.                Abd:  Soft, NT, ND, + BS, no organomegaly               Neurological: Pt is alert. At baseline orientation, motor grossly intact               Skin: Skin is warm. No rashes, no other new lesions, LE edema - trace bialt LE left > right               Psychiatric: Pt behavior is normal without agitation   Micro: none  Cardiac tracings I have personally interpreted today:  none  Pertinent Radiological findings (  summarize): none   Lab Results  Component Value Date   WBC 5.7 12/09/2020   HGB 13.1 12/09/2020   HCT 38.7 12/09/2020   PLT 215.0 12/09/2020   GLUCOSE 127 (H) 06/08/2021   CHOL 147 06/08/2021   TRIG 130.0 06/08/2021   HDL 48.30 06/08/2021   LDLDIRECT 150.0 04/03/2021   LDLCALC 73 06/08/2021   ALT 37 (H) 06/08/2021   AST 29 06/08/2021   NA 138 06/08/2021   K 3.7 06/08/2021   CL 100 06/08/2021   CREATININE 0.82 06/08/2021   BUN 15 06/08/2021   CO2 28  06/08/2021   TSH 2.16 04/03/2021   HGBA1C 6.4 06/08/2021   MICROALBUR <0.7 12/09/2020   Assessment/Plan:  [name] [name] is a 65 y.o. White or Caucasian [1] female with  has a past medical history of ALLERGIC RHINITIS (04/06/2007), ANXIETY (04/06/2007), Arthritis, Cataract, CIN I (cervical intraepithelial neoplasia I), COLONIC POLYPS (08/04/2007), Diabetes mellitus without complication ([place]), DIARRHEA, ACUTE (09/23/2010), Fibroid, GASTRIC POLYP (08/04/2007), GERD (08/04/2007), GOITER, MULTINODULAR (03/10/2009), Headache(784.0) (11/13/2008), blood clots, HYPERLIPIDEMIA (04/06/2007), HYPERTENSION (04/06/2007), ISCHEMIC COLITIS, HX OF (78/67/6720), Lichen sclerosus (9470), Osteopenia (08/2018), Palpitations (03/10/2009), Rosacea (11/30/2007), THYROID NODULE, LEFT (11/04/2010), Urinary frequency, and UTI'S, RECURRENT (11/30/2007).  Hyperlipidemia Lab Results  Component Value Date   LDLCALC 73 06/08/2021   Mild uncontrolled, pt to continue current statin lovastatin but at 40 mg only due to leg cramp trial per pt request   Essential hypertension BP Readings from Last 3 Encounters:  06/15/21 130/64  12/15/20 120/72  08/18/20 132/78   Stable, pt to continue medical treatment diovan hct   Diabetes Lab Results  Component Value Date   HGBA1C 6.4 06/08/2021   Stable, pt to continue current medical treatment  - diet   Varicose veins of both lower extremities mulitiple bilateral LEs, for vascular surgury referral per pt request  Obesity For TFts panel,  to f/u any worsening symptoms or concerns  Followup: No follow-ups on file.  [name] Cower, MD 06/15/2021 10:43 PM [facility]

## 2021-06-15 NOTE — Assessment & Plan Note (Signed)
BP Readings from Last 3 Encounters:  06/15/21 130/64  12/15/20 120/72  08/18/20 132/78   Stable, pt to continue medical treatment diovan hct

## 2021-06-15 NOTE — Assessment & Plan Note (Signed)
Lab Results  Component Value Date   HGBA1C 6.4 06/08/2021   Stable, pt to continue current medical treatment  - diet

## 2021-06-15 NOTE — Assessment & Plan Note (Signed)
Lab Results  Component Value Date   LDLCALC 73 06/08/2021   Mild uncontrolled, pt to continue current statin lovastatin but at 40 mg only due to leg cramp trial per pt request

## 2021-06-15 NOTE — Patient Instructions (Addendum)
Ok to decrease the lovastatin to 40 mg per day  You will be contacted regarding the referral for: vascular surgury for the veins and swelling  Please continue all other medications as before, and refills have been done if requested.  Please have the pharmacy call with any other refills you may need.  Please continue your efforts at being more active, low cholesterol diet, and weight control.  You are otherwise up to date with prevention measures today.  Please keep your appointments with your specialists as you may have planned  We will try to order the thyroid panel and insulin resistance testing that you mentioned with [facility]  Please make an Appointment to return in 6 months, or sooner if needed

## 2021-06-24 ENCOUNTER — Other Ambulatory Visit: Payer: Self-pay | Admitting: Internal Medicine

## 2021-06-25 LAB — SPECIMEN STATUS REPORT

## 2021-06-26 ENCOUNTER — Encounter: Payer: Self-pay | Admitting: Internal Medicine

## 2021-06-26 LAB — NMR LIPOPROF WSUBCLS+GRAPH

## 2021-06-26 LAB — THYROID PANEL WITH TSH
Free Thyroxine Index: 1.6 (ref 1.2–4.9)
T3 Uptake Ratio: 21 % — ABNORMAL LOW (ref 24–39)
T4, Total: 7.7 ug/dL (ref 4.5–12.0)
TSH: 1.36 u[IU]/mL (ref 0.450–4.500)

## 2021-07-21 ENCOUNTER — Other Ambulatory Visit: Payer: Self-pay

## 2021-07-21 DIAGNOSIS — I8393 Asymptomatic varicose veins of bilateral lower extremities: Secondary | ICD-10-CM

## 2021-07-28 ENCOUNTER — Encounter: Payer: Self-pay | Admitting: Vascular Surgery

## 2021-07-28 ENCOUNTER — Ambulatory Visit (HOSPITAL_COMMUNITY)
Admission: RE | Admit: 2021-07-28 | Discharge: 2021-07-28 | Disposition: A | Discharge status: Home / Self Care | Payer: 59 | Source: Ambulatory Visit | Attending: Vascular Surgery | Admitting: Vascular Surgery

## 2021-07-28 ENCOUNTER — Ambulatory Visit: Payer: 59 | Admitting: Vascular Surgery

## 2021-07-28 ENCOUNTER — Other Ambulatory Visit: Payer: Self-pay

## 2021-07-28 DIAGNOSIS — I8393 Asymptomatic varicose veins of bilateral lower extremities: Secondary | ICD-10-CM | POA: Insufficient documentation

## 2021-07-28 DIAGNOSIS — I872 Venous insufficiency (chronic) (peripheral): Secondary | ICD-10-CM

## 2021-07-28 NOTE — Progress Notes (Signed)
Patient name: [name] [name] MRN: [id number] DOB: 27-Mar-1956 Sex: female  REASON FOR CONSULT: Evaluate left leg swelling  HPI: [name] [name] is a 65 y.o. female, with history of hypertension, hyperlipidemia, diabetes that presents for evaluation of left leg swelling.  She is unclear about the exact duration but has noticed leg swelling for some time.  She denies any history of trauma or DVT to the left leg.  She has tried wearing compression stockings but states she has a reaction to the latex.  She also has some prominent spider veins that are concerning.  No previous venous interventions.  Past Medical History:  Diagnosis Date   ALLERGIC RHINITIS 04/06/2007   ANXIETY 04/06/2007   Arthritis    Cataract    CIN I (cervical intraepithelial neoplasia I)    COLONIC POLYPS 08/04/2007   Diabetes mellitus without complication ([place])    DIARRHEA, ACUTE 09/23/2010   Fibroid    GASTRIC POLYP 08/04/2007   GERD 08/04/2007   GOITER, MULTINODULAR 03/10/2009   Headache(784.0) 11/13/2008   Hx of blood clots    HYPERLIPIDEMIA 04/06/2007   HYPERTENSION 04/06/2007   ISCHEMIC COLITIS, HX OF 33/54/5625   Lichen sclerosus 6389   Osteopenia 08/2018   T score -1.1 FRAX 7% / 0.5%   Palpitations 03/10/2009   Rosacea 11/30/2007   THYROID NODULE, LEFT 11/04/2010   Urinary frequency    UTI'S, RECURRENT 11/30/2007    Past Surgical History:  Procedure Laterality Date   APPENDECTOMY  2005   bladder tack     COLPOSCOPY     CYSTOSCOPY  2007   DERMOID CYST  EXCISION  1994   right ovary   FLEXIBLE SIGMOIDOSCOPY  11/16/95   Lt. pectoral/subclavicular abscess drainage  2006   removed skull cyst  1973   right foot neuroma  1988   uterine fibroids removed  1996    Family History  Problem Relation Age of Onset   Stroke Brother    Deep vein thrombosis Brother    Macular degeneration Brother    Diabetes Mother    Hypertension Mother    Heart disease Mother    Lung cancer Mother    Lung cancer Maternal  Grandmother    Heart disease Father    Stroke Father    Emphysema Father    Breast cancer Paternal Aunt        Age 81's   Lung cancer Maternal Uncle    Colon cancer Neg Hx    Stomach cancer Neg Hx    Rectal cancer Neg Hx    Esophageal cancer Neg Hx    Liver cancer Neg Hx     SOCIAL HISTORY: Social History   Socioeconomic History   Marital status: Single    Spouse name: Not on file   Number of children: Not on file   Years of education: Not on file   Highest education level: Not on file  Occupational History   Not on file  Tobacco Use   Smoking status: Never   Smokeless tobacco: Never   Tobacco comments:    passive tobacco smoke exposure as a child  Vaping Use   Vaping Use: Never used  Substance and Sexual Activity   Alcohol use: Yes    Alcohol/week: 0.0 standard drinks    Comment: Rare   Drug use: No   Sexual activity: Not Currently    Birth control/protection: Post-menopausal    Comment: 1st intercourse 65 yo-Fewer than 5 partners  Other Topics Concern  Not on file  Social History Narrative   Not on file   Social Determinants of Health   Financial Resource Strain: Not on file  Food Insecurity: Not on file  Transportation Needs: Not on file  Physical Activity: Not on file  Stress: Not on file  Social Connections: Not on file  Intimate Partner Violence: Not on file    Allergies  Allergen Reactions   Atorvastatin Other (See Comments)    REACTION: extreme muscle aches   Cefuroxime Axetil     REACTION: DIARRHEA   Ciprofloxacin     REACTION: diarrhea   Crestor [Rosuvastatin] Other (See Comments)    myalgias   Latex     REACTION: rash   Lipitor [Atorvastatin Calcium] Other (See Comments)    sensitive   Moxifloxacin     REACTION: DIARRHEA    Current Outpatient Medications  Medication Sig Dispense Refill   acetaminophen (TYLENOL) 500 MG tablet Take 500 mg by mouth every 6 (six) hours as needed.     Cholecalciferol (VITAMIN D3 PO) Take by mouth.      co-enzyme Q-10 30 MG capsule Take 100 mg by mouth daily.     guaiFENesin (MUCINEX) 600 MG 12 hr tablet      levocetirizine (XYZAL) 5 MG tablet Take 5 mg by mouth daily.     lovastatin (MEVACOR) 40 MG tablet Take 1 tablet (40 mg total) by mouth at bedtime. 90 tablet 3   magnesium 30 MG tablet Take 30 mg by mouth 1 day or 1 dose.     omeprazole (PRILOSEC) 20 MG capsule Take 20 mg by mouth daily.     potassium chloride SA (KLOR-CON) 20 MEQ tablet TAKE 1 TABLET BY MOUTH  TWICE DAILY 180 tablet 3   valsartan-hydrochlorothiazide (DIOVAN HCT) 320-25 MG tablet Take 1 tablet by mouth daily. 90 tablet 3   vitamin B-12 (CYANOCOBALAMIN) 500 MCG tablet Take 500 mcg by mouth daily.     Zinc 50 MG TABS Take 50 mg by mouth daily.     azelastine (ASTELIN) 0.1 % nasal spray SMARTSIG:1-2 Puff(s) Both Nares Twice Daily (Patient not taking: Reported on 07/28/2021)     No current facility-administered medications for this visit.    REVIEW OF SYSTEMS:  [X]  denotes positive finding, [ ]  denotes negative finding Cardiac  Comments:  Chest pain or chest pressure:    Shortness of breath upon exertion:    Short of breath when lying flat:    Irregular heart rhythm:        Vascular    Pain in calf, thigh, or hip brought on by ambulation:    Pain in feet at night that wakes you up from your sleep:     Blood clot in your veins:    Leg swelling:  x       Pulmonary    Oxygen at home:    Productive cough:     Wheezing:         Neurologic    Sudden weakness in arms or legs:     Sudden numbness in arms or legs:     Sudden onset of difficulty speaking or slurred speech:    Temporary loss of vision in one eye:     Problems with dizziness:         Gastrointestinal    Blood in stool:     Vomited blood:         Genitourinary    Burning when urinating:     Blood in  urine:        Psychiatric    Major depression:         Hematologic    Bleeding problems:    Problems with blood clotting too easily:         Skin    Rashes or ulcers:        Constitutional    Fever or chills:      PHYSICAL EXAM: Vitals:   07/28/21 1324  BP: 133/80  Pulse: 74  Resp: 16  Temp: 97.9 F (36.6 C)  TempSrc: Temporal  SpO2: 94%  Weight: 168 lb (76.2 kg)  Height: 5\' 2"  (1.575 m)    GENERAL: The patient is a well-nourished female, in no acute distress. The vital signs are documented above. CARDIAC: There is a regular rate and rhythm.  VASCULAR:  Palpable femoral pulses bilaterally Palpable DP PT pulses bilateral Bilateral lower extremity spider veins with no obvious large varicosities PULMONARY: No respiratory distress. ABDOMEN: Soft and non-tender. MUSCULOSKELETAL: There are no major deformities or cyanosis. NEUROLOGIC: No focal weakness or paresthesias are detected. SKIN: There are no ulcers or rashes noted. PSYCHIATRIC: The patient has a normal affect.  DATA:   Indications: Swelling, and spider veins.     Performing Technologist: June Leap RDMS, RVT      Examination Guidelines: A complete evaluation includes B-mode imaging,  spectral  Doppler, color Doppler, and power Doppler as needed of all accessible  portions  of each vessel. Bilateral testing is considered an integral part of a  complete  examination. Limited examinations for reoccurring indications may be  performed  as noted. The reflux portion of the exam is performed with the patient in  reverse Trendelenburg.  Significant venous reflux is defined as >500 ms in the superficial venous  system, and >1 second in the deep venous system.      +--------------+---------+------+-----------+------------+--------+  LEFT          Reflux NoRefluxReflux TimeDiameter cmsComments                          Yes                                   +--------------+---------+------+-----------+------------+--------+  CFV                     yes   >1 second                        +--------------+---------+------+-----------+------------+--------+  FV mid        no                                              +--------------+---------+------+-----------+------------+--------+  Popliteal     no                                              +--------------+---------+------+-----------+------------+--------+  GSV at [facility]    no                            0.61              +--------------+---------+------+-----------+------------+--------+  GSV prox thighno                            0.57              +--------------+---------+------+-----------+------------+--------+  GSV mid thigh no                            0.43              +--------------+---------+------+-----------+------------+--------+  GSV dist thighno                            0.46              +--------------+---------+------+-----------+------------+--------+  GSV at knee   no                            0.41              +--------------+---------+------+-----------+------------+--------+  GSV prox calf no                            0.42              +--------------+---------+------+-----------+------------+--------+  GSV mid calf                                0.42              +--------------+---------+------+-----------+------------+--------+  SSV Pop Fossa no                            0.23              +--------------+---------+------+-----------+------------+--------+  SSV prox calf no                            0.27              +--------------+---------+------+-----------+------------+--------+  SSV mid calf  no                            0.29              +--------------+---------+------+-----------+------------+--------+           Assessment/Plan:  65 year old female presents with left leg swelling and underlying venous insufficiency with CEAP classification C3.  I discussed that based on her venous reflux  study she only has evidence of deep venous reflux.  I discussed that she is not a candidate for surgical intervention as far as laser ablation etc.  Discussed conservative management for deep venous reflux including weight loss, exercise, leg elevation and compression.  She is concerned about a number of spider veins that are prominent on both lower extremities.  I did offer the option of sclerotherapy although we discussed this would be out-of-pocket.  She did meet with our sclerotherapy nurse and we will likely get this scheduled in the near future.  We also sized her for knee-high compression stockings 15 to 20 mmHg   Marty Heck, MD [facility] Office: [phone]

## 2021-08-19 ENCOUNTER — Other Ambulatory Visit: Payer: Self-pay

## 2021-08-19 ENCOUNTER — Ambulatory Visit (INDEPENDENT_AMBULATORY_CARE_PROVIDER_SITE_OTHER): Payer: 59 | Admitting: Nurse Practitioner

## 2021-08-19 ENCOUNTER — Telehealth: Payer: Self-pay | Admitting: *Deleted

## 2021-08-19 ENCOUNTER — Encounter: Payer: Self-pay | Admitting: Nurse Practitioner

## 2021-08-19 ENCOUNTER — Ambulatory Visit: Payer: 59 | Admitting: Obstetrics and Gynecology

## 2021-08-19 VITALS — BP 122/78 | Ht 61.0 in | Wt 172.0 lb

## 2021-08-19 DIAGNOSIS — N644 Mastodynia: Secondary | ICD-10-CM

## 2021-08-19 DIAGNOSIS — Z01419 Encounter for gynecological examination (general) (routine) without abnormal findings: Secondary | ICD-10-CM

## 2021-08-19 DIAGNOSIS — N898 Other specified noninflammatory disorders of vagina: Secondary | ICD-10-CM

## 2021-08-19 DIAGNOSIS — L280 Lichen simplex chronicus: Secondary | ICD-10-CM | POA: Diagnosis not present

## 2021-08-19 DIAGNOSIS — M85851 Other specified disorders of bone density and structure, right thigh: Secondary | ICD-10-CM

## 2021-08-19 DIAGNOSIS — Z780 Asymptomatic menopausal state: Secondary | ICD-10-CM

## 2021-08-19 DIAGNOSIS — N3000 Acute cystitis without hematuria: Secondary | ICD-10-CM

## 2021-08-19 DIAGNOSIS — R350 Frequency of micturition: Secondary | ICD-10-CM

## 2021-08-19 DIAGNOSIS — L680 Hirsutism: Secondary | ICD-10-CM

## 2021-08-19 LAB — WET PREP FOR TRICH, YEAST, CLUE

## 2021-08-19 MED ORDER — SULFAMETHOXAZOLE-TRIMETHOPRIM 800-160 MG PO TABS: 1.0000 | ORAL_TABLET | Freq: Two times a day (BID) | ORAL | 0 refills | Status: AC

## 2021-08-19 NOTE — Telephone Encounter (Signed)
-----   Message from Tamela Gammon, NP sent at 08/19/2021 12:16 PM EST ----- Regarding: Left breast ultrasound Please send referral for left breast ultrasound for pain. Thank you.

## 2021-08-19 NOTE — Telephone Encounter (Signed)
Order placed at the breast center, patient scheduled on 09/10/21 @ 3:00pm. Patient informed with time and date.

## 2021-08-19 NOTE — Progress Notes (Signed)
[name] [name] [birth year] [id number]   History:  65 y.o. G0 presents for annual exam. Postmenopausal - no HRT, no bleeding. 1994 CIN-1, subsequent paps normal. History of bladder repair, lichen simplex chronicus, DM, HTN. Complains of vaginal odor without discharge or itching. Also has occasional urinary frequency. She has noticed increased facial hair on chin and upper lip, wants testosterone checked. Intermittent left breast pain for about 6 months. Denies mass, nipple discharge, redness, swelling, or skin changes. Normal mammogram history.  Gynecologic History No LMP recorded. Patient is postmenopausal.   Contraception/Family planning: post menopausal status Sexually active: No  Health Maintenance Last Pap: 08/14/2019. Results were: Normal, 5-year repeat Last mammogram: 10/17/2020. Results were: Normal Last colonoscopy: 12/15/2016. Results were: Benign polyp Last Dexa: 09/07/2018. Results were: T-score -1.1, FRAX 7% / 2.5%, 5-year repeat  Past medical history, past surgical history, family history and social history were all reviewed and documented in the EPIC chart. 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  Exam:  Vitals:   08/19/21 1112  BP: 122/78  Weight: 172 lb (78 kg)  Height: 5\' 1"  (1.549 m)   Body mass index is 32.5 kg/m.  General appearance:  Normal Thyroid:  Symmetrical, normal in size, without palpable masses or nodularity. Respiratory  Auscultation:  Clear without wheezing or rhonchi Cardiovascular  Auscultation:  Regular rate, without rubs, murmurs or gallops  Edema/varicosities:  Not grossly evident Abdominal  Soft,nontender, without masses, guarding or rebound.  Liver/spleen:  No organomegaly noted  Hernia:  None appreciated  Skin  Inspection:  Grossly normal Breasts: Examined lying and sitting.   Right: Without masses, retractions, nipple discharge or axillary  adenopathy.   Left: Without masses, retractions, nipple discharge or axillary adenopathy. Slight tenderness in upper outer region with palpation. Genitourinary   Inguinal/mons:  Normal without inguinal adenopathy  External genitalia:  Normal appearing vulva with no masses, tenderness, or lesions  BUS/Urethra/Skene's glands:  Normal  Vagina:  Normal appearing with normal color and discharge, no lesions. Atrophic changes  Cervix:  Normal appearing without discharge or lesions  Uterus:  Normal in size, shape and contour.  Midline and mobile, nontender  Adnexa/parametria:     Rt: Normal in size, without masses or tenderness.   Lt: Normal in size, without masses or tenderness.  Anus and perineum: Normal  Digital rectal exam: Normal sphincter tone without palpated masses or tenderness  UA 2+ leukocytes, nitrate positive, trace blood, SG 1.015, Yellow/cloudy. Microscopic: wbc 20-40, rbc 0-2, many bacteria  Patient informed chaperone available to be present for breast and pelvic exam. Patient has requested no chaperone to be present. Patient has been advised what will be completed during breast and pelvic exam.   Assessment/Plan:  65 y.o. G0 for annual exam.   Well female exam with routine gynecological exam - Education provided on SBEs, importance of preventative screenings, current guidelines, high calcium diet, regular exercise, and multivitamin daily.  Labs with PCP.   Postmenopausal - no HRT, no bleeding.   Lichen simplex chronicus - Intermittent irritation and itching. Only uses topical steroid occasionally.   Osteopenia of neck of right femur - Mild. T-score -1.1. Continue Vitamin D + Calcium and regular exercise. Will repeat at 5-year interval per recommendation.   Hirsutism - Plan: Testos,Total,Free and SHBG (Female). has noticed increased facial hair on chin and upper lip, wants testosterone check. Very likely idiopathic but will check testosterone to rule out.  Vaginal odor -  Plan: WET PREP FOR [place], YEAST, CLUE. Wet prep negative.   Urinary frequency - Plan: Urinalysis w microscopic + reflex culture. UA positive.  Acute cystitis without hematuria - Plan: sulfamethoxazole-trimethoprim (BACTRIM DS) 800-160 MG tablet BID x 3 days. Culture pending.   Pain of left breast - Intermittent left breast pain for about 6 months. Denies mass, nipple discharge, redness, swelling, or skin changes. Normal mammogram history with most recent 09/2020. Normal exam other than slight tenderness with palpation. Will send referral for left breast ultrasound.   Screening for cervical cancer - 1994 CIN-1, subsequent paps normal. Will repeat at 5-year interval per guidelines.  Screening for colon cancer - 2018 colonoscopy. Will repeat at GI's recommended interval.   Return in 1 year for annual.   [place], 11:59 AM 08/19/2021

## 2021-08-22 LAB — URINALYSIS W MICROSCOPIC + REFLEX CULTURE
Bilirubin Urine: NEGATIVE
Casts: NONE SEEN /LPF
Crystals: NONE SEEN /HPF
Glucose, UA: NEGATIVE
Hyaline Cast: NONE SEEN /LPF
Ketones, ur: NEGATIVE
Nitrites, Initial: POSITIVE — AB
Protein, ur: NEGATIVE
Specific Gravity, Urine: 1.015 (ref 1.001–1.035)
Yeast: NONE SEEN /HPF
pH: 6 (ref 5.0–8.0)

## 2021-08-22 LAB — CULTURE INDICATED

## 2021-08-22 LAB — URINE CULTURE
MICRO NUMBER:: 12695854
SPECIMEN QUALITY:: ADEQUATE

## 2021-08-25 ENCOUNTER — Telehealth: Payer: Self-pay

## 2021-08-25 LAB — TESTOS,TOTAL,FREE AND SHBG (FEMALE)
Free Testosterone: 4.6 pg/mL (ref 0.1–6.4)
Sex Hormone Binding: 18 nmol/L (ref 14–73)
Testosterone, Total, LC-MS-MS: 27 ng/dL (ref 2–45)

## 2021-08-25 NOTE — Telephone Encounter (Signed)
If she is not having any other symptoms (burning, flank pain, etc) then she can just leave a urine sample.

## 2021-08-25 NOTE — Telephone Encounter (Signed)
She did not have any blood in her urine at her last visit, so I would recommend she return for urinalysis.

## 2021-08-25 NOTE — Telephone Encounter (Signed)
So are you okay with her coming by for just urinalysis or should she have visit?

## 2021-08-25 NOTE — Telephone Encounter (Signed)
Patient was treated 08/19/21 with generic Septra DS bid x 3 days.  Her urinary frequency is better however she noticed a red tinge to her urine this morning. She said she is still experiencing some urgency and just "feels yucky". Does not notice any fever. SHe said she has taken 3 day regimens in the past and always needs additional antibiotic dosing.She is asking if you will prescribe for her.

## 2021-08-25 NOTE — Telephone Encounter (Signed)
Spoke with patient and informed her. No pain or burning with urination.  She said she will just come for u/a but is not sure if she can make it today as she has another appointment. She will call back and schedule lab appt for either today or tomorrow for u/a.

## 2021-08-26 ENCOUNTER — Other Ambulatory Visit: Payer: Self-pay

## 2021-08-26 ENCOUNTER — Encounter: Payer: Self-pay | Admitting: Internal Medicine

## 2021-08-26 ENCOUNTER — Ambulatory Visit: Payer: 59 | Admitting: Internal Medicine

## 2021-08-26 VITALS — BP 122/80 | HR 79 | Resp 18 | Ht 61.0 in | Wt 172.6 lb

## 2021-08-26 DIAGNOSIS — I10 Essential (primary) hypertension: Secondary | ICD-10-CM | POA: Diagnosis not present

## 2021-08-26 DIAGNOSIS — R350 Frequency of micturition: Secondary | ICD-10-CM | POA: Diagnosis not present

## 2021-08-26 DIAGNOSIS — H9191 Unspecified hearing loss, right ear: Secondary | ICD-10-CM | POA: Diagnosis not present

## 2021-08-26 DIAGNOSIS — E1165 Type 2 diabetes mellitus with hyperglycemia: Secondary | ICD-10-CM

## 2021-08-26 NOTE — Patient Instructions (Signed)
Your right ear was irrigated of wax today  Please continue all other medications as before, and refills have been done if requested.  Please have the pharmacy call with any other refills you may need.  Please continue your efforts at being more active, low cholesterol diet, and weight control.  Please keep your appointments with your specialists as you may have planned  Please go to the LAB at the blood drawing area for the tests to be done - just the urine testing today  You will be contacted by phone if any changes need to be made immediately.  Otherwise, you will receive a letter about your results with an explanation, but please check with MyChart first.  Please remember to sign up for MyChart if you have not done so, as this will be important to you in the future with finding out test results, communicating by private email, and scheduling acute appointments online when needed.

## 2021-08-26 NOTE — Progress Notes (Signed)
Patient ID: [name] [name], female   DOB: 1955-11-16, 65 y.o.   MRN: [id number]        Chief Complaint: follow up right ear hearing loss, urinary frequency, dm, htn       HPI:  [name] [name] is a 65 y.o. female here with c/o 1 wk onset acute right hearing loss again without pain, swelling, vertigo, or tinnitus.  Has hx of recurring ear wax impactions, in fact just saw ENT in July earlier this year for removal. Also has mild urinary frequency x 1 wk despite tx x 3 days with septra per GYN, she is not confident the issue is resolved, asks for repeat urinary testing.  Denies urinary symptoms such as frequency, urgency, flank pain, hematuria or n/v, fever, chills.   Pt denies polydipsia, polyuria, or new focal neuro s/s.         Wt Readings from Last 3 Encounters:  08/26/21 172 lb 9.6 oz (78.3 kg)  08/19/21 172 lb (78 kg)  07/28/21 168 lb (76.2 kg)   BP Readings from Last 3 Encounters:  08/26/21 122/80  08/19/21 122/78  07/28/21 133/80         Past Medical History:  Diagnosis Date   ALLERGIC RHINITIS 04/06/2007   ANXIETY 04/06/2007   Arthritis    Cataract    CIN I (cervical intraepithelial neoplasia I)    COLONIC POLYPS 08/04/2007   Diabetes mellitus without complication ([place])    DIARRHEA, ACUTE 09/23/2010   Fibroid    GASTRIC POLYP 08/04/2007   GERD 08/04/2007   GOITER, MULTINODULAR 03/10/2009   Headache(784.0) 11/13/2008   Hx of blood clots    HYPERLIPIDEMIA 04/06/2007   HYPERTENSION 04/06/2007   ISCHEMIC COLITIS, HX OF 10/93/2355   Lichen sclerosus 7322   Osteopenia 08/2018   T score -1.1 FRAX 7% / 0.5%   Palpitations 03/10/2009   Rosacea 11/30/2007   THYROID NODULE, LEFT 11/04/2010   Urinary frequency    UTI'S, RECURRENT 11/30/2007   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 pectoral/subclavicular abscess drainage  2006   removed skull cyst  1973   right foot neuroma  1988   uterine fibroids removed  1996    reports that she has never smoked. She has never used smokeless tobacco. She reports current alcohol use. She reports that she does not use drugs. family history includes Breast cancer in her paternal aunt; Deep vein thrombosis in her brother; Diabetes in her mother; Emphysema in her father; Heart attack in her brother; Heart disease in her father and mother; Hypertension in her mother; Lung cancer in her maternal grandmother, maternal uncle, and mother; Macular degeneration in her brother; Stroke in her brother and father. Allergies  Allergen Reactions   Atorvastatin Other (See Comments)    REACTION: extreme muscle aches   Cefuroxime Axetil     REACTION: DIARRHEA   Ciprofloxacin     REACTION: diarrhea   Crestor [Rosuvastatin] Other (See Comments)    myalgias   Latex     REACTION: rash   Lipitor [Atorvastatin Calcium] Other (See Comments)    sensitive   Moxifloxacin     REACTION: DIARRHEA   Current Outpatient Medications on File  Prior to Visit  Medication Sig Dispense Refill   acetaminophen (TYLENOL) 500 MG tablet Take 500 mg by mouth every 6 (six) hours as needed.     Cholecalciferol (VITAMIN D3 PO) Take by mouth.     co-enzyme Q-10 30 MG capsule Take 100 mg by mouth daily.     guaiFENesin (MUCINEX) 600 MG 12 hr tablet      levocetirizine (XYZAL) 5 MG tablet Take 5 mg by mouth daily.     lovastatin (MEVACOR) 40 MG tablet Take 1 tablet (40 mg total) by mouth at bedtime. 90 tablet 3   magnesium 30 MG tablet Take 30 mg by mouth 1 day or 1 dose.     omeprazole (PRILOSEC) 20 MG capsule Take 20 mg by mouth daily.     potassium chloride SA (KLOR-CON) 20 MEQ tablet TAKE 1 TABLET BY MOUTH  TWICE DAILY 180 tablet 3   valsartan-hydrochlorothiazide (DIOVAN HCT) 320-25 MG tablet Take 1 tablet by mouth daily. 90 tablet 3    vitamin B-12 (CYANOCOBALAMIN) 500 MCG tablet Take 500 mcg by mouth daily.     Zinc 50 MG TABS Take 50 mg by mouth daily.     No current facility-administered medications on file prior to visit.        ROS:  All others reviewed and negative.  Objective        PE:  BP 122/80   Pulse 79   Resp 18   Ht 5\' 1"  (1.549 m)   Wt 172 lb 9.6 oz (78.3 kg)   SpO2 96%   BMI 32.61 kg/m                 Constitutional: Pt appears in NAD               HENT: Head: NCAT.                Right Ear: External ear normal.  Right wax impaction resolved with irrigation and hearing improved               Left Ear: External ear normal.                Eyes: . Pupils are equal, round, and reactive to light. Conjunctivae and EOM are normal               Nose: without d/c or deformity               Neck: Neck supple. Gross normal ROM               Cardiovascular: Normal rate and regular rhythm.                 Pulmonary/Chest: Effort normal and breath sounds without rales or wheezing.                Abd:  Soft, NT, ND, + BS, no organomegaly               Neurological: Pt is alert. At baseline orientation, motor grossly intact               Skin: Skin is warm. No rashes, no other new lesions, LE edema - none               Psychiatric: Pt behavior is normal without agitation   Micro: none  Cardiac tracings I have personally interpreted today:  none  Pertinent Radiological findings (summarize): none   Lab Results  Component Value Date  WBC 5.7 12/09/2020   HGB 13.1 12/09/2020   HCT 38.7 12/09/2020   PLT 215.0 12/09/2020   GLUCOSE 127 (H) 06/08/2021   CHOL 147 06/08/2021   TRIG 130.0 06/08/2021   HDL 48.30 06/08/2021   LDLDIRECT 150.0 04/03/2021   LDLCALC 73 06/08/2021   ALT 37 (H) 06/08/2021   AST 29 06/08/2021   NA 138 06/08/2021   K 3.7 06/08/2021   CL 100 06/08/2021   CREATININE 0.82 06/08/2021   BUN 15 06/08/2021   CO2 28 06/08/2021   TSH 1.360 06/24/2021   HGBA1C 6.4 06/08/2021    MICROALBUR <0.7 12/09/2020   Assessment/Plan:  RUT BETTERTON is a 65 y.o. White or Caucasian [1] female with  has a past medical history of ALLERGIC RHINITIS (04/06/2007), ANXIETY (04/06/2007), Arthritis, Cataract, CIN I (cervical intraepithelial neoplasia I), COLONIC POLYPS (08/04/2007), Diabetes mellitus without complication ([place]), DIARRHEA, ACUTE (09/23/2010), Fibroid, GASTRIC POLYP (08/04/2007), GERD (08/04/2007), GOITER, MULTINODULAR (03/10/2009), Headache(784.0) (11/13/2008), blood clots, HYPERLIPIDEMIA (04/06/2007), HYPERTENSION (04/06/2007), ISCHEMIC COLITIS, HX OF (71/02/2693), Lichen sclerosus (8546), Osteopenia (08/2018), Palpitations (03/10/2009), Rosacea (11/30/2007), THYROID NODULE, LEFT (11/04/2010), Urinary frequency, UTI'S, RECURRENT (11/30/2007), and 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  Essential hypertension BP Readings from Last 3 Encounters:  08/26/21 122/80  08/19/21 122/78  07/28/21 133/80   Stable, pt to continue medical treatment diovan hct   Diabetes Lab Results  Component Value Date   HGBA1C 6.4 06/08/2021   Stable, pt to continue current medical treatment - diet controlled   Urinary frequency Ok for repeat urine studies - r/o uti  Acute hearing loss, right Resolved with wax impaction irrigation  Ceruminosis is noted.  Wax is removed by syringing and manual debridement. Instructions for home care to prevent wax buildup are given.  Followup: Return if symptoms worsen or fail to improve.  Cathlean Cower, MD 08/30/2021 5:58 PM [facility]

## 2021-08-27 ENCOUNTER — Encounter: Payer: Self-pay | Admitting: Internal Medicine

## 2021-08-27 LAB — URINE CULTURE

## 2021-08-27 LAB — URINALYSIS, ROUTINE W REFLEX MICROSCOPIC
Bilirubin Urine: NEGATIVE
Hgb urine dipstick: NEGATIVE
Ketones, ur: NEGATIVE
Leukocytes,Ua: NEGATIVE
Nitrite: NEGATIVE
RBC / HPF: NONE SEEN (ref 0–?)
Specific Gravity, Urine: <=1.005 — AB (ref 1.000–1.030)
Total Protein, Urine: NEGATIVE
Urine Glucose: NEGATIVE
Urobilinogen, UA: 0.2 (ref 0.0–1.0)
pH: 6 (ref 5.0–8.0)

## 2021-08-28 DIAGNOSIS — I8393 Asymptomatic varicose veins of bilateral lower extremities: Secondary | ICD-10-CM

## 2021-08-30 ENCOUNTER — Encounter: Payer: Self-pay | Admitting: Internal Medicine

## 2021-08-30 NOTE — Assessment & Plan Note (Signed)
BP Readings from Last 3 Encounters:  08/26/21 122/80  08/19/21 122/78  07/28/21 133/80   Stable, pt to continue medical treatment diovan hct

## 2021-08-30 NOTE — Assessment & Plan Note (Signed)
Resolved with wax impaction irrigation  Ceruminosis is noted.  Wax is removed by syringing and manual debridement. Instructions for home care to prevent wax buildup are given.

## 2021-08-30 NOTE — Assessment & Plan Note (Signed)
Lab Results  Component Value Date   HGBA1C 6.4 06/08/2021   Stable, pt to continue current medical treatment - diet controlled

## 2021-08-30 NOTE — Assessment & Plan Note (Signed)
Ok for repeat urine studies - r/o uti

## 2021-09-10 ENCOUNTER — Other Ambulatory Visit: Payer: 59

## 2021-09-21 ENCOUNTER — Encounter (HOSPITAL_COMMUNITY): Payer: Self-pay

## 2021-09-21 ENCOUNTER — Ambulatory Visit (HOSPITAL_COMMUNITY)
Admission: EM | Admit: 2021-09-21 | Discharge: 2021-09-21 | Disposition: A | Discharge status: Home / Self Care | Payer: 59 | Attending: Family Medicine | Admitting: Family Medicine

## 2021-09-21 ENCOUNTER — Other Ambulatory Visit: Payer: Self-pay

## 2021-09-21 DIAGNOSIS — U071 COVID-19: Secondary | ICD-10-CM

## 2021-09-21 DIAGNOSIS — H60391 Other infective otitis externa, right ear: Secondary | ICD-10-CM

## 2021-09-21 MED ORDER — BENZONATATE 100 MG PO CAPS: ORAL_CAPSULE | ORAL | 0 refills | Status: DC

## 2021-09-21 MED ORDER — NEOMYCIN-POLYMYXIN-HC 3.5-10000-1 OT SUSP: 3.0000 [drp] | Freq: Three times a day (TID) | OTIC | 0 refills | Status: DC

## 2021-09-21 NOTE — ED Triage Notes (Signed)
Pt presents with cough, sore throat, bilateral ear pain, vomiting and diarrhea since yesterday.

## 2021-09-21 NOTE — Discharge Instructions (Signed)
You may use over the counter ibuprofen or acetaminophen as needed.  °For a sore throat, over the counter products such as [employer] Peroxyl Mouth Sore Rinse or Chloraseptic Sore Throat Spray may provide some temporary relief. ° ° ° ° °

## 2021-09-22 ENCOUNTER — Ambulatory Visit: Payer: 59 | Admitting: Internal Medicine

## 2021-09-22 ENCOUNTER — Telehealth: Payer: Self-pay | Admitting: Internal Medicine

## 2021-09-22 MED ORDER — NIRMATRELVIR/RITONAVIR (PAXLOVID)TABLET: 3.0000 | ORAL_TABLET | Freq: Two times a day (BID) | ORAL | 0 refills | Status: AC

## 2021-09-22 NOTE — Telephone Encounter (Signed)
Also ask her to hold on taking the lovastatin for total 10 days when starting the paxlovid then restart

## 2021-09-22 NOTE — Telephone Encounter (Signed)
Ok for paxlovid - done erx to [facility] [place]  Please to let pt know

## 2021-09-22 NOTE — Telephone Encounter (Signed)
Patient was seen by urgent care 09/21/21 and advised that it was too late to take antiviral.

## 2021-09-22 NOTE — Telephone Encounter (Signed)
Connected to [facility] 1.2.2023.  Caller states ear is stuffed up, sore throat, vomiting, ear pain, covid+, and has an appt. She wants anti viral drug called in.  Advised to go to ED.

## 2021-09-23 NOTE — ED Provider Notes (Signed)
[facility]   [id number] 09/21/21 Arrival Time: 1109  ASSESSMENT & PLAN:  1. COVID-19 virus infection   2. Other infective acute otitis externa of right ear    No resp distress. Will tx bacterial OE. Discussed. OTC symptom care as needed.  Discharge Medication List as of 09/21/2021  1:24 PM     START taking these medications   Details  benzonatate (TESSALON) 100 MG capsule Take 1 capsule by mouth every 8 (eight) hours for cough., Normal    neomycin-polymyxin-hydrocortisone (CORTISPORIN) 3.5-10000-1 OTIC suspension Place 3 drops into the right ear 3 (three) times daily., Starting Mon 09/21/2021, Normal         Follow-up Information     Biagio Borg, MD .   Specialties: Internal Medicine, Radiology Why: As needed. Contact information: [place] [id number] [facility] .   Specialty: Urgent Care Why: If worsening or failing to improve as anticipated. Contact information: [place] [phone]                Reviewed expectations re: course of current medical issues. Questions answered. Outlined signs and symptoms indicating need for more acute intervention. Understanding verbalized. After Visit Summary given.   SUBJECTIVE: History from: patient. [name] [name] is a 66 y.o. female who reports: cough, ST, bilat ear pain; abrupt onset yesterday. Threw up with one loose stool today. No abd pain. Denies: fever. Normal PO intake without n/v/d.  OBJECTIVE:  Vitals:   09/21/21 1237  BP: 128/82  Pulse: 81  Resp: 17  Temp: 99.1 F (37.3 C)  TempSrc: Oral  SpO2: 98%    General appearance: alert; no distress Eyes: PERRLA; EOMI; conjunctiva normal HENT: [place]; AT; with nasal congestion; throat irritation; R external ear canal swollen, erythematous, edematous Neck: supple  Lungs: speaks full sentences without difficulty; unlabored; dry  cough; clear Extremities: no edema Skin: warm and dry Neurologic: normal gait Psychological: alert and cooperative; normal mood and affect   Allergies  Allergen Reactions   Atorvastatin Other (See Comments)    REACTION: extreme muscle aches   Cefuroxime Axetil     REACTION: DIARRHEA   Ciprofloxacin     REACTION: diarrhea   Crestor [Rosuvastatin] Other (See Comments)    myalgias   Latex     REACTION: rash   Lipitor [Atorvastatin Calcium] Other (See Comments)    sensitive   Moxifloxacin     REACTION: DIARRHEA    Past Medical History:  Diagnosis Date   ALLERGIC RHINITIS 04/06/2007   ANXIETY 04/06/2007   Arthritis    Cataract    CIN I (cervical intraepithelial neoplasia I)    COLONIC POLYPS 08/04/2007   Diabetes mellitus without complication ([place])    DIARRHEA, ACUTE 09/23/2010   Fibroid    GASTRIC POLYP 08/04/2007   GERD 08/04/2007   GOITER, MULTINODULAR 03/10/2009   Headache(784.0) 11/13/2008   Hx of blood clots    HYPERLIPIDEMIA 04/06/2007   HYPERTENSION 04/06/2007   ISCHEMIC COLITIS, HX OF 33/82/5053   Lichen sclerosus 9767   Osteopenia 08/2018   T score -1.1 FRAX 7% / 0.5%   Palpitations 03/10/2009   Rosacea 11/30/2007   THYROID NODULE, LEFT 11/04/2010   Urinary frequency    UTI'S, RECURRENT 11/30/2007   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    Social History   Socioeconomic History   Marital status: Single  Spouse name: Not on file   Number of children: Not on file   Years of education: Not on file   Highest education level: Not on file  Occupational History   Not on file  Tobacco Use   Smoking status: Never   Smokeless tobacco: Never   Tobacco comments:    passive tobacco smoke exposure as a child  Vaping Use   Vaping Use: Never used  Substance and Sexual Activity   Alcohol use: Yes    Alcohol/week: 0.0 standard drinks    Comment: Rare   Drug use: No   Sexual activity: Not Currently    Birth control/protection:  Post-menopausal    Comment: 1st intercourse 66 yo-Fewer than 5 partners  Other Topics Concern   Not on file  Social History Narrative   Not on file   Social Determinants of Health   Financial Resource Strain: Not on file  Food Insecurity: Not on file  Transportation Needs: Not on file  Physical Activity: Not on file  Stress: Not on file  Social Connections: Not on file  Intimate Partner Violence: Not on file   Family History  Problem Relation Age of Onset   Diabetes Mother    Hypertension Mother    Heart disease Mother    Lung cancer Mother    Heart disease Father    Stroke Father    Emphysema Father    Stroke Brother    Deep vein thrombosis Brother    Macular degeneration Brother    Heart attack Brother    Lung cancer Maternal Uncle    Breast cancer Paternal Aunt        Age 27's   Lung cancer Maternal Grandmother    Colon cancer Neg Hx    Stomach cancer Neg Hx    Rectal cancer Neg Hx    Esophageal cancer Neg Hx    Liver cancer Neg Hx    Past Surgical History:  Procedure Laterality Date   APPENDECTOMY  2005   bladder tack     COLPOSCOPY     CYSTOSCOPY  2007   DERMOID CYST  EXCISION  1994   right ovary   FLEXIBLE SIGMOIDOSCOPY  11/16/95   Lt. pectoral/subclavicular abscess drainage  2006   removed skull cyst  1973   right foot neuroma  1988   uterine fibroids removed  1996     Vanessa Kick, MD 09/23/21 [phone]

## 2021-09-30 ENCOUNTER — Other Ambulatory Visit: Payer: Self-pay

## 2021-09-30 ENCOUNTER — Ambulatory Visit: Payer: 59 | Admitting: Internal Medicine

## 2021-09-30 ENCOUNTER — Encounter: Payer: Self-pay | Admitting: Internal Medicine

## 2021-09-30 VITALS — BP 138/81 | HR 80 | Temp 97.6°F | Ht 61.0 in | Wt 173.0 lb

## 2021-09-30 DIAGNOSIS — E1165 Type 2 diabetes mellitus with hyperglycemia: Secondary | ICD-10-CM

## 2021-09-30 DIAGNOSIS — J069 Acute upper respiratory infection, unspecified: Secondary | ICD-10-CM

## 2021-09-30 DIAGNOSIS — J029 Acute pharyngitis, unspecified: Secondary | ICD-10-CM | POA: Diagnosis not present

## 2021-09-30 DIAGNOSIS — I10 Essential (primary) hypertension: Secondary | ICD-10-CM

## 2021-09-30 LAB — POCT RAPID STREP A (OFFICE): Rapid Strep A Screen: NEGATIVE

## 2021-09-30 MED ORDER — DOXYCYCLINE HYCLATE 100 MG PO TABS: 100.0000 mg | ORAL_TABLET | Freq: Two times a day (BID) | ORAL | 0 refills | Status: DC

## 2021-09-30 NOTE — Patient Instructions (Signed)
Please take all new medication as prescribed - the docycyxline antibiotic  Please continue all other medications as before, and refills have been done if requested.  Please have the pharmacy call with any other refills you may need.  Please continue your efforts at being more active, low cholesterol diet, and weight control.  Please keep your appointments with your specialists as you may have planned

## 2021-09-30 NOTE — Assessment & Plan Note (Signed)
Lab Results  Component Value Date   HGBA1C 6.4 06/08/2021   Stable, pt to continue current medical treatment  - diet

## 2021-09-30 NOTE — Assessment & Plan Note (Signed)
Mild to mod, for antibx course,  to f/u any worsening symptoms or concerns 

## 2021-09-30 NOTE — Progress Notes (Signed)
Patient ID: [name] [name], female   DOB: [birth year], 66 y.o.   MRN: [id number]        Chief Complaint: follow up sore throat, DM, htn       HPI:  [name] [name] is a 66 y.o. female here with c/o recent covid infection improved except for small post covid non prod cough, now with 2-3 days onset severe ST again with low grade temp and milder sinus congestion, pain , colored d/c and right ear pain.  Denies HA, n/v, sob, wheezing, CP.  Still with mild low stamina as well but seems more post covid than more acute.  Tolerated paxlovid.   Pt denies polydipsia, polyuria, or new focal neuro s/s.   Pt denies fever, wt loss, night sweats, loss of appetite, or other constitutional symptoms          Wt Readings from Last 3 Encounters:  09/30/21 173 lb (78.5 kg)  08/26/21 172 lb 9.6 oz (78.3 kg)  08/19/21 172 lb (78 kg)   BP Readings from Last 3 Encounters:  09/30/21 138/81  09/21/21 128/82  08/26/21 122/80         Past Medical History:  Diagnosis Date   ALLERGIC RHINITIS 04/06/2007   ANXIETY 04/06/2007   Arthritis    Cataract    CIN I (cervical intraepithelial neoplasia I)    COLONIC POLYPS 08/04/2007   Diabetes mellitus without complication ([place])    DIARRHEA, ACUTE 09/23/2010   Fibroid    GASTRIC POLYP 08/04/2007   GERD 08/04/2007   GOITER, MULTINODULAR 03/10/2009   Headache(784.0) 11/13/2008   Hx of blood clots    HYPERLIPIDEMIA 04/06/2007   HYPERTENSION 04/06/2007   ISCHEMIC COLITIS, HX OF 35/45/6256   Lichen sclerosus 3893   Osteopenia 08/2018   T score -1.1 FRAX 7% / 0.5%   Palpitations 03/10/2009   Rosacea 11/30/2007   THYROID NODULE, LEFT 11/04/2010   Urinary frequency    UTI'S, RECURRENT 11/30/2007   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    Past Surgical History:  Procedure Laterality Date   APPENDECTOMY  2005   bladder tack     COLPOSCOPY     CYSTOSCOPY  2007   DERMOID CYST  EXCISION  1994   right ovary   FLEXIBLE SIGMOIDOSCOPY  11/16/95   Lt.  pectoral/subclavicular abscess drainage  2006   removed skull cyst  1973   right foot neuroma  1988   uterine fibroids removed  1996    reports that she has never smoked. She has never used smokeless tobacco. She reports current alcohol use. She reports that she does not use drugs. family history includes Breast cancer in her paternal aunt; Deep vein thrombosis in her brother; Diabetes in her mother; Emphysema in her father; Heart attack in her brother; Heart disease in her father and mother; Hypertension in her mother; Lung cancer in her maternal grandmother, maternal uncle, and mother; Macular degeneration in her brother; Stroke in her brother and father. Allergies  Allergen Reactions   Atorvastatin Other (See Comments)    REACTION: extreme muscle aches   Cefuroxime Axetil     REACTION: DIARRHEA   Ciprofloxacin     REACTION: diarrhea   Crestor [Rosuvastatin] Other (See Comments)    myalgias   Latex     REACTION: rash   Lipitor [Atorvastatin Calcium] Other (See Comments)    sensitive   Moxifloxacin     REACTION: DIARRHEA   Current Outpatient Medications on File Prior to Visit  Medication Sig Dispense Refill   acetaminophen (TYLENOL) 500 MG tablet Take 500 mg by mouth every 6 (six) hours as needed.     benzonatate (TESSALON) 100 MG capsule Take 1 capsule by mouth every 8 (eight) hours for cough. 21 capsule 0   Cholecalciferol (VITAMIN D3 PO) Take by mouth.     co-enzyme Q-10 30 MG capsule Take 100 mg by mouth daily.     guaiFENesin (MUCINEX) 600 MG 12 hr tablet      levocetirizine (XYZAL) 5 MG tablet Take 5 mg by mouth daily.     lovastatin (MEVACOR) 40 MG tablet Take 1 tablet (40 mg total) by mouth at bedtime. 90 tablet 3   magnesium 30 MG tablet Take 30 mg by mouth 1 day or 1 dose.     neomycin-polymyxin-hydrocortisone (CORTISPORIN) 3.5-10000-1 OTIC suspension Place 3 drops into the right ear 3 (three) times daily. 10 mL 0   omeprazole (PRILOSEC) 20 MG capsule Take 20 mg by mouth  daily.     potassium chloride SA (KLOR-CON) 20 MEQ tablet TAKE 1 TABLET BY MOUTH  TWICE DAILY 180 tablet 3   valsartan-hydrochlorothiazide (DIOVAN HCT) 320-25 MG tablet Take 1 tablet by mouth daily. 90 tablet 3   vitamin B-12 (CYANOCOBALAMIN) 500 MCG tablet Take 500 mcg by mouth daily.     Zinc 50 MG TABS Take 50 mg by mouth daily.     No current facility-administered medications on file prior to visit.        ROS:  All others reviewed and negative.  Objective        PE:  BP 138/81 (BP Location: Left Arm, Patient Position: Sitting, Cuff Size: Normal)    Pulse 80    Temp 97.6 F (36.4 C) (Oral)    Ht 5\' 1"  (1.549 m)    Wt 173 lb (78.5 kg)    SpO2 96%    BMI 32.69 kg/m                 Constitutional: Pt appears in NAD               HENT: Head: NCAT.                Right Ear: External ear normal.                 Left Ear: External ear normal. Bilat tm's with mild erythema.  Max sinus areas non tender.  Pharynx with mild erythema, no exudate               Eyes: . Pupils are equal, round, and reactive to light. Conjunctivae and EOM are normal               Nose: without d/c or deformity               Neck: Neck supple. Gross normal ROM               Cardiovascular: Normal rate and regular rhythm.                 Pulmonary/Chest: Effort normal and breath sounds without rales or wheezing.                Abd:  Soft, NT, ND, + BS, no organomegaly               Neurological: Pt is alert. At baseline orientation, motor grossly intact  Skin: Skin is warm. No rashes, no other new lesions, LE edema - noe               Psychiatric: Pt behavior is normal without agitation   Micro: none  Cardiac tracings I have personally interpreted today:  none  Pertinent Radiological findings (summarize): none   Lab Results  Component Value Date   WBC 5.7 12/09/2020   HGB 13.1 12/09/2020   HCT 38.7 12/09/2020   PLT 215.0 12/09/2020   GLUCOSE 127 (H) 06/08/2021   CHOL 147 06/08/2021   TRIG  130.0 06/08/2021   HDL 48.30 06/08/2021   LDLDIRECT 150.0 04/03/2021   LDLCALC 73 06/08/2021   ALT 37 (H) 06/08/2021   AST 29 06/08/2021   NA 138 06/08/2021   K 3.7 06/08/2021   CL 100 06/08/2021   CREATININE 0.82 06/08/2021   BUN 15 06/08/2021   CO2 28 06/08/2021   TSH 1.360 06/24/2021   HGBA1C 6.4 06/08/2021   MICROALBUR <0.7 12/09/2020   Rapid Strep A Screen Negative Negative    Assessment/Plan:  AGUSTA HACKENBERG is a 66 y.o. White or Caucasian [1] female with  has a past medical history of ALLERGIC RHINITIS (04/06/2007), ANXIETY (04/06/2007), Arthritis, Cataract, CIN I (cervical intraepithelial neoplasia I), COLONIC POLYPS (08/04/2007), Diabetes mellitus without complication ([place]), DIARRHEA, ACUTE (09/23/2010), Fibroid, GASTRIC POLYP (08/04/2007), GERD (08/04/2007), GOITER, MULTINODULAR (03/10/2009), Headache(784.0) (11/13/2008), blood clots, HYPERLIPIDEMIA (04/06/2007), HYPERTENSION (04/06/2007), ISCHEMIC COLITIS, HX OF (50/27/7412), Lichen sclerosus (8786), Osteopenia (08/2018), Palpitations (03/10/2009), Rosacea (11/30/2007), THYROID NODULE, LEFT (11/04/2010), Urinary frequency, UTI'S, RECURRENT (11/30/2007), and 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  URI (upper respiratory infection) Mild to mod, for antibx course,  to f/u any worsening symptoms or concerns  Essential hypertension BP Readings from Last 3 Encounters:  09/30/21 138/81  09/21/21 128/82  08/26/21 122/80   Stable, pt to continue medical treatment diovan hct   Diabetes Lab Results  Component Value Date   HGBA1C 6.4 06/08/2021   Stable, pt to continue current medical treatment  - diet  Followup: Return if symptoms worsen or fail to improve.  Cathlean Cower, MD 09/30/2021 7:37 PM [facility]

## 2021-09-30 NOTE — Assessment & Plan Note (Signed)
BP Readings from Last 3 Encounters:  09/30/21 138/81  09/21/21 128/82  08/26/21 122/80   Stable, pt to continue medical treatment diovan hct

## 2021-10-04 ENCOUNTER — Other Ambulatory Visit: Payer: Self-pay | Admitting: Internal Medicine

## 2021-10-04 NOTE — Telephone Encounter (Signed)
Please refill as per office routine med refill policy (all routine meds to be refilled for 3 mo or monthly (per pt preference) up to one year from last visit, then month to month grace period for 3 mo, then further med refills will have to be denied) ? ?

## 2021-10-19 ENCOUNTER — Other Ambulatory Visit: Payer: Self-pay

## 2021-10-19 ENCOUNTER — Ambulatory Visit
Admission: RE | Admit: 2021-10-19 | Discharge: 2021-10-19 | Disposition: A | Discharge status: Home / Self Care | Payer: 59 | Source: Ambulatory Visit | Attending: Nurse Practitioner | Admitting: Nurse Practitioner

## 2021-10-19 ENCOUNTER — Ambulatory Visit: Admission: RE | Admit: 2021-10-19 | Payer: 59 | Source: Ambulatory Visit

## 2021-10-19 DIAGNOSIS — N644 Mastodynia: Secondary | ICD-10-CM

## 2021-11-13 ENCOUNTER — Ambulatory Visit (INDEPENDENT_AMBULATORY_CARE_PROVIDER_SITE_OTHER): Payer: 59

## 2021-11-13 ENCOUNTER — Other Ambulatory Visit: Payer: Self-pay

## 2021-11-13 DIAGNOSIS — I781 Nevus, non-neoplastic: Secondary | ICD-10-CM

## 2021-11-13 NOTE — Progress Notes (Signed)
Treated pt's BLE spider veins and small reticular veins with Asclera 1% administered with a 27g butterfly.  Patient received a total of 6 mL. Easy access; anticipate good results. Pt tolerated well. Pt was given post treatment instructions on both handout and verbally. Will follow prn.  Photos: Yes.    Compression stockings applied: Yes.

## 2021-11-17 ENCOUNTER — Telehealth: Payer: Self-pay | Admitting: Internal Medicine

## 2021-11-17 DIAGNOSIS — R350 Frequency of micturition: Secondary | ICD-10-CM

## 2021-11-17 NOTE — Telephone Encounter (Signed)
Pt states her urine is cloudy and she Is urinating frequently x1w pt thinks she has an uti and requesting an UA   Offered pt an appt, pt declined

## 2021-11-17 NOTE — Telephone Encounter (Signed)
Ok orders done  [facility] to do at the [facility] lab if she likes  - no appt needed

## 2021-11-18 ENCOUNTER — Other Ambulatory Visit (INDEPENDENT_AMBULATORY_CARE_PROVIDER_SITE_OTHER): Payer: 59

## 2021-11-18 ENCOUNTER — Other Ambulatory Visit: Payer: Self-pay | Admitting: Internal Medicine

## 2021-11-18 DIAGNOSIS — R350 Frequency of micturition: Secondary | ICD-10-CM

## 2021-11-18 LAB — URINALYSIS, ROUTINE W REFLEX MICROSCOPIC
Bilirubin Urine: NEGATIVE
Ketones, ur: NEGATIVE
Nitrite: NEGATIVE
Specific Gravity, Urine: <=1.005 — AB (ref 1.000–1.030)
Total Protein, Urine: NEGATIVE
Urine Glucose: NEGATIVE
Urobilinogen, UA: 0.2 (ref 0.0–1.0)
pH: 7 (ref 5.0–8.0)

## 2021-11-18 MED ORDER — SULFAMETHOXAZOLE-TRIMETHOPRIM 800-160 MG PO TABS: 1.0000 | ORAL_TABLET | Freq: Two times a day (BID) | ORAL | 0 refills | Status: DC

## 2021-11-18 NOTE — Telephone Encounter (Signed)
Patient notified of lab orders

## 2021-11-19 LAB — URINE CULTURE
MICRO NUMBER:: 13073921
SPECIMEN QUALITY:: ADEQUATE

## 2021-12-17 ENCOUNTER — Encounter: Payer: 59 | Admitting: Internal Medicine

## 2022-01-03 ENCOUNTER — Other Ambulatory Visit: Payer: Self-pay | Admitting: Internal Medicine

## 2022-01-03 NOTE — Telephone Encounter (Signed)
Please refill as per office routine med refill policy (all routine meds to be refilled for 3 mo or monthly (per pt preference) up to one year from last visit, then month to month grace period for 3 mo, then further med refills will have to be denied) ? ?

## 2022-01-07 ENCOUNTER — Encounter: Payer: 59 | Admitting: Internal Medicine

## 2022-02-08 ENCOUNTER — Ambulatory Visit (HOSPITAL_COMMUNITY)
Admission: RE | Admit: 2022-02-08 | Discharge: 2022-02-08 | Disposition: A | Discharge status: Home / Self Care | Payer: 59 | Source: Ambulatory Visit | Attending: Urology | Admitting: Urology

## 2022-02-08 ENCOUNTER — Telehealth: Payer: Self-pay | Admitting: *Deleted

## 2022-02-08 ENCOUNTER — Other Ambulatory Visit (HOSPITAL_COMMUNITY): Payer: Self-pay | Admitting: Urology

## 2022-02-08 DIAGNOSIS — R9389 Abnormal findings on diagnostic imaging of other specified body structures: Secondary | ICD-10-CM

## 2022-02-08 DIAGNOSIS — D49512 Neoplasm of unspecified behavior of left kidney: Secondary | ICD-10-CM

## 2022-02-08 NOTE — Telephone Encounter (Signed)
Please set her up for an ultrasound here and f/u visit with me. In order to get her in sooner, the visits don't have to be the same day.  Please ask her if she has had an vaginal bleeding.

## 2022-02-08 NOTE — Telephone Encounter (Signed)
[name] [name] patient)  Patient called back reports Dr.MacDiarmid ordered CT scan abdomen and pelvis which noted thickening of uterine lining. I called patient she would prefer to follow up with Dr.Jertson regarding this. I called [facility] and they are going to fax a copy of report.   Copy placed on Dr.Jertson desk to review.

## 2022-02-08 NOTE — Telephone Encounter (Signed)
Left message for patient to call.

## 2022-02-08 NOTE — Telephone Encounter (Signed)
-----   Message from [place] sent at 02/08/2022  9:25 AM EDT ----- Regarding: Pelvic U/s Needed Inbound call from patient stating she was just diagnosed with Kidney cancer. Is needing a pelvic u/s as it was found that she had thickening of the uterine lining. Is requesting to be seen by Dr. Talbert Nan moving forward not the NP.  [phone]

## 2022-02-09 NOTE — Telephone Encounter (Signed)
Patient said she has not noticed any vaginal bleeding. She has noticed small drop of blood in urine a couple of times. She mentioned that she saw Dr.Winters yesterday and the cancer is on the left side of the kidney (not right side) which is noted on the CT scan report. Dr.Winter said he was going to notify the radiologist so the reports could be corrected. Patient was referred to Dr.Winter from Dr.MacDiarmid due to cancer diagnosis.   I told patient the order has been placed and appointments will call to schedule.

## 2022-02-09 NOTE — Telephone Encounter (Signed)
Patient ultrasound scheduled on 02/11/22, OV scheduled on 02/17/22

## 2022-02-10 LAB — HM DIABETES EYE EXAM

## 2022-02-11 ENCOUNTER — Ambulatory Visit (INDEPENDENT_AMBULATORY_CARE_PROVIDER_SITE_OTHER): Payer: 59

## 2022-02-11 DIAGNOSIS — R9389 Abnormal findings on diagnostic imaging of other specified body structures: Secondary | ICD-10-CM

## 2022-02-17 ENCOUNTER — Ambulatory Visit: Payer: 59 | Admitting: Obstetrics and Gynecology

## 2022-02-17 ENCOUNTER — Other Ambulatory Visit (HOSPITAL_COMMUNITY)
Admission: RE | Admit: 2022-02-17 | Discharge: 2022-02-17 | Disposition: A | Discharge status: Home / Self Care | Payer: 59 | Source: Ambulatory Visit | Attending: Obstetrics and Gynecology | Admitting: Obstetrics and Gynecology

## 2022-02-17 ENCOUNTER — Encounter: Payer: Self-pay | Admitting: Obstetrics and Gynecology

## 2022-02-17 ENCOUNTER — Telehealth: Payer: Self-pay

## 2022-02-17 VITALS — BP 132/74 | HR 72 | Wt 167.0 lb

## 2022-02-17 DIAGNOSIS — R9389 Abnormal findings on diagnostic imaging of other specified body structures: Secondary | ICD-10-CM

## 2022-02-17 DIAGNOSIS — N950 Postmenopausal bleeding: Secondary | ICD-10-CM

## 2022-02-17 DIAGNOSIS — N9489 Other specified conditions associated with female genital organs and menstrual cycle: Secondary | ICD-10-CM | POA: Diagnosis not present

## 2022-02-17 NOTE — Patient Instructions (Signed)

## 2022-02-17 NOTE — Telephone Encounter (Signed)
[facility] Radiology Dept called back and said I needed to call [name], as that office reads their testing.  [facility] # [phone].  I called [facility] and spoke with lady who took all the information. She said she will send it to Dr. Tery Sanfilippo and put it at the top of his list so that the next time he reads it will be the first thing he looks at.

## 2022-02-17 NOTE — Progress Notes (Addendum)
GYNECOLOGY  VISIT   HPI: 66 y.o.   Single White or Caucasian Not Hispanic or Latino  female   G0P0 with No LMP recorded. Patient is postmenopausal.   here for evaluation of a thickened endometrium. She was incidentally noted  to have an endometrial stripe of 8-9 mm on CT scan done for hematuria. The CT also showed a mass in her right kidney suspicious for renal cell cancer. She has surgery planned in ~6 weeks. Pelvic ultrasound was done last week. It showed an endometrial stripe of 8.58 mm as well as a 3 cm mass in the left adnexa, suspicious for hydrosalpinx.  She is having clear discharge. She states that every once in a while she sees a little brown vaginal discharge.   She reports a h/o endometriosis and removal of her right ovary for a dermoid cyst.   GYNECOLOGIC HISTORY: No LMP recorded. Patient is postmenopausal. Contraception:PMP Menopausal hormone therapy: none         OB History     Gravida  0   Para      Term      Preterm      AB      Living         SAB      IAB      Ectopic      Multiple      Live Births                 Patient Active Problem List   Diagnosis Date Noted   Acute hearing loss, right 08/26/2021   Chronic venous insufficiency 07/28/2021   Varicose veins of both lower extremities 06/15/2021   Statin myopathy 12/24/2020   Chronic allergic conjunctivitis 12/15/2020   Vasomotor rhinitis 12/15/2020   Abnormal urine odor 08/06/2020   AKI (acute kidney injury) ([place]) 11/06/2019   Abdominal pain 08/08/2018   Controlled type 2 diabetes mellitus without complication ([place]) 41/32/4401   URI (upper respiratory infection) 04/12/2018   Osteoarthritis of right knee 12/20/2017   Pain in right knee 11/28/2017   Pain of right calf 11/28/2017   Acute upper respiratory infection 10/05/2017   Peripheral edema 03/31/2017   Sinusitis, chronic 09/17/2016   Cough 09/09/2016   Contact dermatitis 08/28/2014   Obesity 07/02/2014   Rash and nonspecific  skin eruption 07/02/2014   Bronchospasm 05/07/2014   Dysplasia of cervix, low grade (CIN 1) 09/01/2012   Urinary urgency 05/16/2012   Encounter for well adult exam with abnormal findings 07/04/2011   THYROID NODULE, LEFT 11/04/2010   Diabetes ([place]) 10/26/2010   PYELONEPHRITIS, ACUTE 09/23/2010   VAGINITIS, CANDIDAL 11/26/2009   SKIN LESION 06/19/2009   GOITER, MULTINODULAR 03/10/2009   Palpitations 03/10/2009   Headache(784.0) 11/13/2008   Urinary frequency 09/11/2008   History of colonic polyps 07/04/2008   UTI'S, RECURRENT 11/30/2007   ENDOMETRIOSIS 11/30/2007   Dysmenorrhea 11/30/2007   Rosacea 11/30/2007   GASTRIC POLYP 08/04/2007   GERD 08/04/2007   ISCHEMIC COLITIS, HX OF 07/06/2007   Hyperlipidemia 04/06/2007   ANXIETY 04/06/2007   Essential hypertension 04/06/2007   Allergic rhinitis 04/06/2007   ENDOMETRIAL POLYP 01/11/2002    Past Medical History:  Diagnosis Date   ALLERGIC RHINITIS 04/06/2007   ANXIETY 04/06/2007   Arthritis    Cataract    CIN I (cervical intraepithelial neoplasia I)    COLONIC POLYPS 08/04/2007   Diabetes mellitus without complication ([place])    DIARRHEA, ACUTE 09/23/2010   Fibroid    GASTRIC POLYP 08/04/2007  GERD 08/04/2007   GOITER, MULTINODULAR 03/10/2009   Headache(784.0) 11/13/2008   Hx of blood clots    HYPERLIPIDEMIA 04/06/2007   HYPERTENSION 04/06/2007   ISCHEMIC COLITIS, HX OF 69/48/5462   Lichen sclerosus 7035   Osteopenia 08/2018   T score -1.1 FRAX 7% / 0.5%   Palpitations 03/10/2009   Rosacea 11/30/2007   THYROID NODULE, LEFT 11/04/2010   Urinary frequency    UTI'S, RECURRENT 11/30/2007   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     Past Surgical History:  Procedure Laterality Date   APPENDECTOMY  2005   bladder tack     COLPOSCOPY     CYSTOSCOPY  2007   DERMOID CYST  EXCISION  1994   right ovary   FLEXIBLE SIGMOIDOSCOPY  11/16/95   Lt. pectoral/subclavicular abscess drainage  2006   removed  skull cyst  1973   right foot neuroma  1988   uterine fibroids removed  1996    Current Outpatient Medications  Medication Sig Dispense Refill   acetaminophen (TYLENOL) 500 MG tablet Take 500 mg by mouth every 6 (six) hours as needed.     Cholecalciferol (VITAMIN D3 PO) Take by mouth.     co-enzyme Q-10 30 MG capsule Take 100 mg by mouth daily.     guaiFENesin (MUCINEX) 600 MG 12 hr tablet      levocetirizine (XYZAL) 5 MG tablet Take 5 mg by mouth daily.     lovastatin (MEVACOR) 40 MG tablet Take 1 tablet (40 mg total) by mouth at bedtime. 90 tablet 3   magnesium 30 MG tablet Take 30 mg by mouth 1 day or 1 dose.     neomycin-polymyxin-hydrocortisone (CORTISPORIN) 3.5-10000-1 OTIC suspension Place 3 drops into the right ear 3 (three) times daily. 10 mL 0   omeprazole (PRILOSEC) 20 MG capsule Take 20 mg by mouth daily.     potassium chloride SA (KLOR-CON M) 20 MEQ tablet TAKE 1 TABLET BY MOUTH TWICE  DAILY 180 tablet 0   valsartan-hydrochlorothiazide (DIOVAN-HCT) 320-25 MG tablet TAKE 1 TABLET BY MOUTH  DAILY 90 tablet 0   vitamin B-12 (CYANOCOBALAMIN) 500 MCG tablet Take 500 mcg by mouth daily.     Zinc 50 MG TABS Take 50 mg by mouth daily.     No current facility-administered medications for this visit.     ALLERGIES: Atorvastatin, Cefuroxime axetil, Ciprofloxacin, Crestor [rosuvastatin], Latex, Lipitor [atorvastatin calcium], and Moxifloxacin  Family History  Problem Relation Age of Onset   Diabetes Mother    Hypertension Mother    Heart disease Mother    Lung cancer Mother    Heart disease Father    Stroke Father    Emphysema Father    Stroke Brother    Deep vein thrombosis Brother    Macular degeneration Brother    Heart attack Brother    Lung cancer Maternal Uncle    Breast cancer Paternal Aunt        Age 23's   Lung cancer Maternal Grandmother    Colon cancer Neg Hx    Stomach cancer Neg Hx    Rectal cancer Neg Hx    Esophageal cancer Neg Hx    Liver cancer Neg Hx      Social History   Socioeconomic History   Marital status: Single    Spouse name: Not on file   Number of children: Not on file   Years of education: Not on file   Highest education level: Not on file  Occupational History   Not on file  Tobacco Use   Smoking status: Never   Smokeless tobacco: Never   Tobacco comments:    passive tobacco smoke exposure as a child  Vaping Use   Vaping Use: Never used  Substance and Sexual Activity   Alcohol use: Yes    Alcohol/week: 0.0 standard drinks    Comment: Rare   Drug use: No   Sexual activity: Not Currently    Birth control/protection: Post-menopausal    Comment: 1st intercourse 66 yo-Fewer than 5 partners  Other Topics Concern   Not on file  Social History Narrative   Not on file   Social Determinants of Health   Financial Resource Strain: Not on file  Food Insecurity: Not on file  Transportation Needs: Not on file  Physical Activity: Not on file  Stress: Not on file  Social Connections: Not on file  Intimate Partner Violence: Not on file    Review of Systems  All other systems reviewed and are negative.  PHYSICAL EXAMINATION:    BP 132/74   Pulse 72   Wt 167 lb (75.8 kg)   SpO2 99%   BMI 31.55 kg/m     General appearance: alert, cooperative and appears stated age   Pelvic: External genitalia:  no lesions              Urethra:  normal appearing urethra with no masses, tenderness or lesions              Bartholins and Skenes: normal                 Vagina: atrophic appearing vagina with normal color and discharge, no lesions              Cervix: no lesions               The risks of endometrial biopsy were reviewed and a consent was obtained.  A speculum was placed in the vagina and the cervix was cleansed with betadine. A tenaculum was placed on the cervix and the uterine evacuator was placed into the endometrial cavity. The uterus sounded to 6 cm. The endometrial biopsy was performed, taking care to get a  representative sample, sampling 360 degrees of the uterine cavity. Minimal tissue was obtained. The tenaculum and speculum were removed. There were no complications.   Chaperone was present for exam.  1. Postmenopausal bleeding Thickened stripe.  - Surgical pathology( [place]/ [facility])  2. Thickened endometrium - Surgical pathology( [place]/ [facility])  3. Adnexal mass Not seen on CT scan, h/o endometriosis, possible hydrosalpinx -Will ask the radiologist to review the CT again in the left adnexal region -I've looked through her chart. Her last pelvic ultrasound was in 02/2009 and it showed a 2.1 x 1.8 x 0.8 cm avascular cyst in her left ovary.  -Will try to get a copy of her op note from her right oophorectomy to see if they mentioned a hydrosalpinx on the left -Will consider f/u u/s in 3 months, can also ask the Urologist to take a picture of the left adnexa during her surgery for her renal mass.   In addition to the endometrial biopsy, over 30 minutes was spent in total patient care.   Addendum: Op Note from 09/06/90 was reviewed. She had a laparotomy with bilateral ovarian cystectomy, and excision of endometriosis. The cyst on the left was an endometrioma and on the right was a dermoid.  Op note from 07/09/93  was removed. She had exploratory laparotomy with multiple myomectomies and excision of 2 left ovarian cysts (dermoid). The left tube was noted to have some adhesions, but not a hydrosalpinx.

## 2022-02-17 NOTE — Telephone Encounter (Signed)
Patient had CT Abd/Pelvis with and without contrast at [facility] on 01/28/22. Thickened endometrium was noted and pelvic u/s recommended.  Pelvic u/s performed in our office today and noted ? Left hydrosalpinx versus complex left ovarian cyst.  Dr. Talbert Nan asked me to reach out to Radiologist on the CT and ask them to review the CT again to see if they might see any evidence of what they are seeing on u/s here today.  I called [facility] and was transferred to their Radiology Dept and left message in voice mail with this inquiry.

## 2022-02-18 ENCOUNTER — Encounter: Payer: Self-pay | Admitting: Internal Medicine

## 2022-02-18 ENCOUNTER — Ambulatory Visit (INDEPENDENT_AMBULATORY_CARE_PROVIDER_SITE_OTHER): Payer: 59 | Admitting: Internal Medicine

## 2022-02-18 VITALS — BP 134/72 | HR 63 | Temp 97.8°F | Ht 61.0 in | Wt 168.0 lb

## 2022-02-18 DIAGNOSIS — E538 Deficiency of other specified B group vitamins: Secondary | ICD-10-CM | POA: Diagnosis not present

## 2022-02-18 DIAGNOSIS — E559 Vitamin D deficiency, unspecified: Secondary | ICD-10-CM

## 2022-02-18 DIAGNOSIS — Z0001 Encounter for general adult medical examination with abnormal findings: Secondary | ICD-10-CM

## 2022-02-18 DIAGNOSIS — E1165 Type 2 diabetes mellitus with hyperglycemia: Secondary | ICD-10-CM

## 2022-02-18 DIAGNOSIS — E042 Nontoxic multinodular goiter: Secondary | ICD-10-CM | POA: Diagnosis not present

## 2022-02-18 DIAGNOSIS — E7800 Pure hypercholesterolemia, unspecified: Secondary | ICD-10-CM | POA: Diagnosis not present

## 2022-02-18 DIAGNOSIS — N840 Polyp of corpus uteri: Secondary | ICD-10-CM

## 2022-02-18 DIAGNOSIS — N2889 Other specified disorders of kidney and ureter: Secondary | ICD-10-CM

## 2022-02-18 DIAGNOSIS — I10 Essential (primary) hypertension: Secondary | ICD-10-CM

## 2022-02-18 LAB — CBC WITH DIFFERENTIAL/PLATELET
Basophils Absolute: 0.1 10*3/uL (ref 0.0–0.1)
Basophils Relative: 1.1 % (ref 0.0–3.0)
Eosinophils Absolute: 0.2 10*3/uL (ref 0.0–0.7)
Eosinophils Relative: 3.3 % (ref 0.0–5.0)
HCT: 39.1 % (ref 36.0–46.0)
Hemoglobin: 13.1 g/dL (ref 12.0–15.0)
Lymphocytes Relative: 39 % (ref 12.0–46.0)
Lymphs Abs: 2.6 10*3/uL (ref 0.7–4.0)
MCHC: 33.5 g/dL (ref 30.0–36.0)
MCV: 83 fl (ref 78.0–100.0)
Monocytes Absolute: 0.5 10*3/uL (ref 0.1–1.0)
Monocytes Relative: 7.1 % (ref 3.0–12.0)
Neutro Abs: 3.3 10*3/uL (ref 1.4–7.7)
Neutrophils Relative %: 49.5 % (ref 43.0–77.0)
Platelets: 201 10*3/uL (ref 150.0–400.0)
RBC: 4.72 Mil/uL (ref 3.87–5.11)
RDW: 13.7 % (ref 11.5–15.5)
WBC: 6.6 10*3/uL (ref 4.0–10.5)

## 2022-02-18 LAB — LIPID PANEL
Cholesterol: 161 mg/dL (ref 0–200)
HDL: 46.2 mg/dL (ref >39.00)
LDL Cholesterol: 92 mg/dL (ref 0–99)
NonHDL: 114.65
Total CHOL/HDL Ratio: 3
Triglycerides: 112 mg/dL (ref 0.0–149.0)
VLDL: 22.4 mg/dL (ref 0.0–40.0)

## 2022-02-18 LAB — BASIC METABOLIC PANEL
BUN: 13 mg/dL (ref 6–23)
CO2: 26 mEq/L (ref 19–32)
Calcium: 9.9 mg/dL (ref 8.4–10.5)
Chloride: 104 mEq/L (ref 96–112)
Creatinine, Ser: 0.78 mg/dL (ref 0.40–1.20)
GFR: 79.38 mL/min (ref >60.00)
Glucose, Bld: 108 mg/dL — ABNORMAL HIGH (ref 70–99)
Potassium: 3.9 mEq/L (ref 3.5–5.1)
Sodium: 140 mEq/L (ref 135–145)

## 2022-02-18 LAB — HEPATIC FUNCTION PANEL
ALT: 41 U/L — ABNORMAL HIGH (ref 0–35)
AST: 30 U/L (ref 0–37)
Albumin: 4.7 g/dL (ref 3.5–5.2)
Alkaline Phosphatase: 73 U/L (ref 39–117)
Bilirubin, Direct: 0.1 mg/dL (ref 0.0–0.3)
Total Bilirubin: 0.4 mg/dL (ref 0.2–1.2)
Total Protein: 7.4 g/dL (ref 6.0–8.3)

## 2022-02-18 LAB — VITAMIN D 25 HYDROXY (VIT D DEFICIENCY, FRACTURES): VITD: 63.53 ng/mL (ref 30.00–100.00)

## 2022-02-18 LAB — URINALYSIS, ROUTINE W REFLEX MICROSCOPIC
Bilirubin Urine: NEGATIVE
Hgb urine dipstick: NEGATIVE
Ketones, ur: NEGATIVE
Leukocytes,Ua: NEGATIVE
Nitrite: NEGATIVE
RBC / HPF: NONE SEEN (ref 0–?)
Specific Gravity, Urine: <=1.005 — AB (ref 1.000–1.030)
Total Protein, Urine: NEGATIVE
Urine Glucose: NEGATIVE
Urobilinogen, UA: 0.2 (ref 0.0–1.0)
WBC, UA: NONE SEEN (ref 0–?)
pH: 6 (ref 5.0–8.0)

## 2022-02-18 LAB — VITAMIN B12: Vitamin B-12: 931 pg/mL — ABNORMAL HIGH (ref 211–911)

## 2022-02-18 LAB — MICROALBUMIN / CREATININE URINE RATIO
Creatinine,U: 20.5 mg/dL
Microalb Creat Ratio: 3.4 mg/g (ref 0.0–30.0)
Microalb, Ur: <0.7 mg/dL (ref 0.0–1.9)

## 2022-02-18 LAB — HEMOGLOBIN A1C: Hgb A1c MFr Bld: 7 % — ABNORMAL HIGH (ref 4.6–6.5)

## 2022-02-18 LAB — T4, FREE: Free T4: 0.92 ng/dL (ref 0.60–1.60)

## 2022-02-18 LAB — TSH: TSH: 0.88 u[IU]/mL (ref 0.35–5.50)

## 2022-02-18 NOTE — Assessment & Plan Note (Addendum)
Lab Results  Component Value Date   LDLCALC 73 06/08/2021   Uncontrolled, goal ldl <70,, pt to continue current statin lovastatin 40 mg as declines change, for lower chol diet

## 2022-02-18 NOTE — Assessment & Plan Note (Signed)
No change per pt, declines f/u thyroid u/s , for free t4

## 2022-02-18 NOTE — Assessment & Plan Note (Signed)

## 2022-02-18 NOTE — Assessment & Plan Note (Signed)
Also for possible TAH soon,  to f/u any worsening symptoms or concerns

## 2022-02-18 NOTE — Assessment & Plan Note (Signed)
Lab Results  Component Value Date   HGBA1C 6.4 06/08/2021   Stable, pt to continue current medical treatment  - diet, wt control,  to f/u any worsening symptoms or concerns

## 2022-02-18 NOTE — Progress Notes (Signed)
Patient ID: [name] [name], female   DOB: 01-17-1956, 66 y.o.   MRN: [id number]         Chief Complaint:: wellness exam and dm, hld, htn, left renal mass, endometrial polyp       HPI:  [name] [name] is a 65 y.o. female here for wellness exam; declines covid booster o/w up to date.                        Also has recent CT and Gyn eval c/w left renal mass, and uterine polyp, now very likely to need left kidney surgury and TAH.  S/p covid infection dec 2022.  Pt denies chest pain, increased sob or doe, wheezing, orthopnea, PND, increased LE swelling, palpitations, dizziness or syncope.   Pt denies polydipsia, polyuria, or new focal neuro s/s.    Pt denies fever, wt loss, night sweats, loss of appetite, or other constitutional symptoms     Wt Readings from Last 3 Encounters:  02/18/22 168 lb (76.2 kg)  02/17/22 167 lb (75.8 kg)  09/30/21 173 lb (78.5 kg)   BP Readings from Last 3 Encounters:  02/18/22 134/72  02/17/22 132/74  09/30/21 138/81   Immunization History  Administered Date(s) Administered   Influenza Split 09/06/2016   Influenza Whole 06/20/2009, 06/20/2010   Influenza, High Dose Seasonal PF 12/09/2016, 11/30/2018, 11/29/2019, 11/25/2020, 11/26/2021   Influenza,inj,Quad PF,6+ Mos 08/14/2019   Influenza,inj,Quad PF,6-35 Mos 07/04/2017   Influenza-Unspecified 05/21/2012, 05/21/2014, 06/25/2015, 07/15/2017   PFIZER(Purple Top)SARS-COV-2 Vaccination 11/01/2019, 11/22/2019, 08/23/2020   Pneumococcal Conjugate-13 10/05/2017   Pneumococcal Polysaccharide-23 08/28/2014, 09/12/2015, 12/09/2016, 11/30/2018, 11/29/2019, 11/25/2020, 11/26/2021   Td 03/20/1996, 09/15/2009   Tdap 05/06/2020   Zoster Recombinat (Shingrix) 12/15/2020, 04/03/2021   Health Maintenance Due  Topic Date Due   HEMOGLOBIN A1C  12/06/2021      Past Medical History:  Diagnosis Date   ALLERGIC RHINITIS 04/06/2007   ANXIETY 04/06/2007   Arthritis    Cataract    CIN I (cervical intraepithelial neoplasia  I)    COLONIC POLYPS 08/04/2007   Diabetes mellitus without complication ([place])    DIARRHEA, ACUTE 09/23/2010   Fibroid    GASTRIC POLYP 08/04/2007   GERD 08/04/2007   GOITER, MULTINODULAR 03/10/2009   Headache(784.0) 11/13/2008   Hx of blood clots    HYPERLIPIDEMIA 04/06/2007   HYPERTENSION 04/06/2007   ISCHEMIC COLITIS, HX OF 57/84/6962   Lichen sclerosus 9528   Osteopenia 08/2018   T score -1.1 FRAX 7% / 0.5%   Palpitations 03/10/2009   Rosacea 11/30/2007   THYROID NODULE, LEFT 11/04/2010   Urinary frequency    UTI'S, RECURRENT 11/30/2007   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    Past Surgical History:  Procedure Laterality Date   APPENDECTOMY  2005   bladder tack     COLPOSCOPY     CYSTOSCOPY  2007   DERMOID CYST  EXCISION  1994   right ovary   FLEXIBLE SIGMOIDOSCOPY  11/16/95   Lt. pectoral/subclavicular abscess drainage  2006   removed skull cyst  1973   right foot neuroma  1988   uterine fibroids removed  1996    reports that she has never smoked. She has never used smokeless tobacco. She reports current alcohol use. She reports that she does not use drugs. family history includes Breast cancer in her paternal aunt; Deep vein thrombosis in her brother; Diabetes in her mother; Emphysema in her father; Heart attack in her brother;  Heart disease in her father and mother; Hypertension in her mother; Lung cancer in her maternal grandmother, maternal uncle, and mother; Macular degeneration in her brother; Stroke in her brother and father. Allergies  Allergen Reactions   Atorvastatin Other (See Comments)    REACTION: extreme muscle aches   Cefuroxime Axetil     REACTION: DIARRHEA   Ciprofloxacin     REACTION: diarrhea   Crestor [Rosuvastatin] Other (See Comments)    myalgias   Latex     REACTION: rash   Lipitor [Atorvastatin Calcium] Other (See Comments)    sensitive   Moxifloxacin     REACTION: DIARRHEA   Current Outpatient Medications on  File Prior to Visit  Medication Sig Dispense Refill   acetaminophen (TYLENOL) 500 MG tablet Take 500 mg by mouth every 6 (six) hours as needed.     Cholecalciferol (VITAMIN D3 PO) Take by mouth.     co-enzyme Q-10 30 MG capsule Take 100 mg by mouth daily.     guaiFENesin (MUCINEX) 600 MG 12 hr tablet      levocetirizine (XYZAL) 5 MG tablet Take 5 mg by mouth daily.     lovastatin (MEVACOR) 40 MG tablet Take 1 tablet (40 mg total) by mouth at bedtime. 90 tablet 3   magnesium 30 MG tablet Take 30 mg by mouth 1 day or 1 dose.     omeprazole (PRILOSEC) 20 MG capsule Take 20 mg by mouth daily.     potassium chloride SA (KLOR-CON M) 20 MEQ tablet TAKE 1 TABLET BY MOUTH TWICE  DAILY 180 tablet 0   valsartan-hydrochlorothiazide (DIOVAN-HCT) 320-25 MG tablet TAKE 1 TABLET BY MOUTH  DAILY 90 tablet 0   vitamin B-12 (CYANOCOBALAMIN) 500 MCG tablet Take 500 mcg by mouth daily.     Zinc 50 MG TABS Take 50 mg by mouth daily.     No current facility-administered medications on file prior to visit.        ROS:  All others reviewed and negative.  Objective        PE:  BP 134/72 (BP Location: Right Arm, Patient Position: Sitting, Cuff Size: Large)   Pulse 63   Temp 97.8 F (36.6 C) (Oral)   Ht '5\' 1"'$  (1.549 m)   Wt 168 lb (76.2 kg)   SpO2 98%   BMI 31.74 kg/m                 Constitutional: Pt appears in NAD               HENT: Head: NCAT.                Right Ear: External ear normal.                 Left Ear: External ear normal.                Eyes: . Pupils are equal, round, and reactive to light. Conjunctivae and EOM are normal               Nose: without d/c or deformity               Neck: Neck supple. Gross normal ROM               Cardiovascular: Normal rate and regular rhythm.                 Pulmonary/Chest: Effort normal and breath sounds without rales or wheezing.  Abd:  Soft, NT, ND, + BS, no organomegaly               Neurological: Pt is alert. At baseline  orientation, motor grossly intact               Skin: Skin is warm. No rashes, no other new lesions, LE edema - none               Psychiatric: Pt behavior is normal without agitation   Micro: none  Cardiac tracings I have personally interpreted today:  none  Pertinent Radiological findings (summarize): none   Lab Results  Component Value Date   WBC 5.7 12/09/2020   HGB 13.1 12/09/2020   HCT 38.7 12/09/2020   PLT 215.0 12/09/2020   GLUCOSE 127 (H) 06/08/2021   CHOL 147 06/08/2021   TRIG 130.0 06/08/2021   HDL 48.30 06/08/2021   LDLDIRECT 150.0 04/03/2021   LDLCALC 73 06/08/2021   ALT 37 (H) 06/08/2021   AST 29 06/08/2021   NA 138 06/08/2021   K 3.7 06/08/2021   CL 100 06/08/2021   CREATININE 0.82 06/08/2021   BUN 15 06/08/2021   CO2 28 06/08/2021   TSH 1.360 06/24/2021   HGBA1C 6.4 06/08/2021   MICROALBUR <0.7 12/09/2020   Assessment/Plan:  [name] [name] is a 66 y.o. White or Caucasian [1] female with  has a past medical history of ALLERGIC RHINITIS (04/06/2007), ANXIETY (04/06/2007), Arthritis, Cataract, CIN I (cervical intraepithelial neoplasia I), COLONIC POLYPS (08/04/2007), Diabetes mellitus without complication ([place]), DIARRHEA, ACUTE (09/23/2010), Fibroid, GASTRIC POLYP (08/04/2007), GERD (08/04/2007), GOITER, MULTINODULAR (03/10/2009), Headache(784.0) (11/13/2008), blood clots, HYPERLIPIDEMIA (04/06/2007), HYPERTENSION (04/06/2007), ISCHEMIC COLITIS, HX OF (45/40/9811), Lichen sclerosus (9147), Osteopenia (08/2018), Palpitations (03/10/2009), Rosacea (11/30/2007), THYROID NODULE, LEFT (11/04/2010), Urinary frequency, UTI'S, RECURRENT (11/30/2007), and 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  Encounter for well adult exam with abnormal findings Age and sex appropriate education and counseling updated with regular exercise and diet Referrals for preventative services - none needed Immunizations addressed - declines covid booster Smoking counseling  -  none needed Evidence for depression or other mood disorder - none significant Most recent labs reviewed. I have personally reviewed and have noted: 1) the patient's medical and social history 2) The patient's current medications and supplements 3) The patient's height, weight, and BMI have been recorded in the chart   Left renal mass For likely partial nephrectomy soon,  F/u urology as planned  Hyperlipidemia Lab Results  Component Value Date   [place] 73 06/08/2021   Uncontrolled, goal ldl <70,, pt to continue current statin lovastatin 40 mg as declines change, for lower chol diet   GOITER, MULTINODULAR No change per pt, declines f/u thyroid u/s , for free t4  Essential hypertension BP Readings from Last 3 Encounters:  02/18/22 134/72  02/17/22 132/74  09/30/21 138/81   Stable, pt to continue medical treatment diovan hct 320-25 mg   Diabetes Lab Results  Component Value Date   HGBA1C 6.4 06/08/2021   Stable, pt to continue current medical treatment  - diet, wt control,  to f/u any worsening symptoms or concerns   ENDOMETRIAL POLYP Also for possible TAH soon,  to f/u any worsening symptoms or concerns  Followup: Return in about 6 months (around 08/20/2022).  Cathlean Cower, MD 02/18/2022 1:13 PM [facility]

## 2022-02-18 NOTE — Assessment & Plan Note (Signed)
BP Readings from Last 3 Encounters:  02/18/22 134/72  02/17/22 132/74  09/30/21 138/81   Stable, pt to continue medical treatment diovan hct 320-25 mg

## 2022-02-18 NOTE — Assessment & Plan Note (Signed)
For likely partial nephrectomy soon,  F/u urology as planned

## 2022-02-18 NOTE — Patient Instructions (Signed)
Please continue all other medications as before, and refills have been done if requested.  Please have the pharmacy call with any other refills you may need.  Please continue your efforts at being more active, low cholesterol diet, and weight control.  You are otherwise up to date with prevention measures today.  Please keep your appointments with your specialists as you may have planned  Please go to the LAB at the blood drawing area for the tests to be done  You will be contacted by phone if any changes need to be made immediately.  Otherwise, you will receive a letter about your results with an explanation, but please check with MyChart first.  Please remember to sign up for MyChart if you have not done so, as this will be important to you in the future with finding out test results, communicating by private email, and scheduling acute appointments online when needed.  Please make an Appointment to return in 6 months, or sooner if needed, also with Lab Appointment for testing done 3-5 days before at the [facility] (so this is for TWO appointments - please see the scheduling desk as you leave)  Due to the ongoing Covid 19 pandemic, our lab now requires an appointment for any labs done at our office.  If you need labs done and do not have an appointment, please call our office ahead of time to schedule before presenting to the lab for your testing.  Emmit Alexanders with your surguries!

## 2022-02-19 ENCOUNTER — Telehealth: Payer: Self-pay | Admitting: Obstetrics and Gynecology

## 2022-02-19 DIAGNOSIS — N9489 Other specified conditions associated with female genital organs and menstrual cycle: Secondary | ICD-10-CM

## 2022-02-19 LAB — SURGICAL PATHOLOGY

## 2022-02-19 NOTE — Telephone Encounter (Signed)
Surgery: CPT [phone] - Hysteroscopy/D&C/Myosure,   Diagnosis: R93.89 Thickened Endometrium, , N95.0 Postmenopausal Bleeding  Location: [facility]  Status: Outpatient  Time: 19 Minutes  Assistant: N/A  Urgency: At Patient's Convenience  Pre-Op Appointment: To Be Scheduled  Post-Op Appointment(s): 1 Week,   Time Out Of Work: 1 day

## 2022-02-23 ENCOUNTER — Telehealth: Payer: Self-pay | Admitting: Internal Medicine

## 2022-02-23 ENCOUNTER — Telehealth: Payer: Self-pay | Admitting: Obstetrics and Gynecology

## 2022-02-23 NOTE — Telephone Encounter (Signed)
I spoke with Dr Tery Sanfilippo who reviewed the CT again and didn't see any concerning findings in the left adnexa. I reviewed her pathology report from 09/06/90 which revealed a benign endometrial cyst on the left, benign left ovarian tissue including a resolving corpus luteum, a right benign cystic teratoma, endometriosis from a bladder peritoneum biopsy and a benign peritoneal biopsy. I suspect the area in question on ultrasound is a hydrosalpinx.  Please let her know, I would continue to recommend a f/u ultrasound in 3 months to f/u on the left adnexal mass (suspected hydrosalpinx). The ultrasound is already ordered, I don't think it's scheduled yet.  Please send a copy of my office notes/visits to Urology. It would be helpful if they could take a picture of the pelvis and left adnexa when they do the nephrectomy. The hysteroscopy, D&C is a simple outpatient procedure. We should be able to get that done prior to her kidney surgery. I will get the process moving forward.

## 2022-02-23 NOTE — Telephone Encounter (Signed)
I spoke with rep at [facility] at they stated that Dr. Tery Sanfilippo wrote "no addendum needed" on CT report after review.

## 2022-02-23 NOTE — Telephone Encounter (Signed)
See telephone encounter dated 02/19/22.  Advised patient of results and recommendations per Dr. Talbert Nan.   Encounter closed.

## 2022-02-23 NOTE — Telephone Encounter (Signed)
Spoke with patient. Reviewed surgery dates. Patient request to proceed with surgery on 03/22/22.  Advised patient I will forward to business office for return call. I will return call once surgery date and time confirmed.   Advised as seen below per Dr. Talbert Nan. Patient request to proceed with scheduling. This RN will fax copy of notes to Urology/ Dr. Lovena Neighbours.   PUS scheduled for 05/27/22 at 10am, consult to follow with Dr. Talbert Nan.    Patient verbalizes understanding and is agreeable.    Surgery request sent.   Routing to [employer], Francesca Jewett, MD  Physician Gynecology Telephone Encounter Signed Creation Time:  02/23/2022  8:53 AM   Signed      I spoke with Dr Tery Sanfilippo who reviewed the CT again and didn't see any concerning findings in the left adnexa. I reviewed her pathology report from 09/06/90 which revealed a benign endometrial cyst on the left, benign left ovarian tissue including a resolving corpus luteum, a right benign cystic teratoma, endometriosis from a bladder peritoneum biopsy and a benign peritoneal biopsy. I suspect the area in question on ultrasound is a hydrosalpinx.  Please let her know, I would continue to recommend a f/u ultrasound in 3 months to f/u on the left adnexal mass (suspected hydrosalpinx). The ultrasound is already ordered, I don't think it's scheduled yet.  Please send a copy of my office notes/visits to Urology. It would be helpful if they could take a picture of the pelvis and left adnexa when they do the nephrectomy. The hysteroscopy, D&C is a simple outpatient procedure. We should be able to get that done prior to her kidney surgery. I will get the process moving forward.

## 2022-02-23 NOTE — Telephone Encounter (Signed)
See open encounter dated 02/19/22.   Encounter closed.

## 2022-02-23 NOTE — Telephone Encounter (Signed)
Surgery: CPT [phone] - Hysteroscopy/D&C/Myosure,   Diagnosis: R93.89 Thickened Endometrium, N95.0 Postmenopausal Bleeding,   Location: [facility]  Status: Outpatient  Time: 69 Minutes  Assistant: N/A  Urgency: At Patient's Convenience  Pre-Op Appointment: To Be Scheduled  Post-Op Appointment(s): 1 Week,   Time Out Of Work: Day Of Surgery, 1 Day Post Op

## 2022-02-23 NOTE — Telephone Encounter (Signed)
Patient would like to have a new glucose kit (monitor, lancets, strips) sent to [facility] - [place], [place]

## 2022-02-24 ENCOUNTER — Other Ambulatory Visit: Payer: Self-pay | Admitting: Urology

## 2022-02-24 NOTE — Telephone Encounter (Signed)
Spoke with patient. Surgery date request confirmed.  Advised surgery is scheduled for 03/22/22, [place], 0730.  Surgery instruction sheet and hospital brochure reviewed, printed copy will be mailed.  Patient verbalizes understanding and is agreeable.  Encounter may be closed once benefits reviewed.

## 2022-02-25 NOTE — Telephone Encounter (Signed)
Processing benefits PA required

## 2022-03-02 MED ORDER — ONETOUCH ULTRASOFT LANCETS MISC: 12 refills | Status: DC

## 2022-03-02 MED ORDER — ONETOUCH ULTRA 2 W/DEVICE KIT
PACK | 0 refills | Status: DC
Start: 2022-03-02 — End: 2022-04-02

## 2022-03-02 MED ORDER — ONETOUCH ULTRA VI STRP: ORAL_STRIP | 12 refills | Status: DC

## 2022-03-02 NOTE — Telephone Encounter (Signed)
Pt is calling to check the status of the Glucose kit.   Pt is needing Meter, Lancets, Strips.  Pharm[id number] [place] [place] [address]

## 2022-03-02 NOTE — Telephone Encounter (Signed)
Prescription has been sent to pharmacy.

## 2022-03-09 ENCOUNTER — Ambulatory Visit: Payer: 59 | Admitting: Obstetrics and Gynecology

## 2022-03-09 ENCOUNTER — Encounter: Payer: Self-pay | Admitting: Obstetrics and Gynecology

## 2022-03-09 VITALS — BP 122/62 | HR 88 | Ht 61.0 in | Wt 165.0 lb

## 2022-03-09 DIAGNOSIS — I10 Essential (primary) hypertension: Secondary | ICD-10-CM

## 2022-03-09 DIAGNOSIS — N950 Postmenopausal bleeding: Secondary | ICD-10-CM | POA: Diagnosis not present

## 2022-03-09 DIAGNOSIS — N2889 Other specified disorders of kidney and ureter: Secondary | ICD-10-CM

## 2022-03-09 DIAGNOSIS — E119 Type 2 diabetes mellitus without complications: Secondary | ICD-10-CM | POA: Diagnosis not present

## 2022-03-09 DIAGNOSIS — R9389 Abnormal findings on diagnostic imaging of other specified body structures: Secondary | ICD-10-CM | POA: Diagnosis not present

## 2022-03-09 NOTE — H&P (View-Only) (Signed)
GYNECOLOGY  VISIT   HPI: 66 y.o.   Single White or Caucasian Not Hispanic or Latino  female   G0P0 with No LMP recorded. Patient is postmenopausal.   here for pre op for D&C Hysteroscopy. She was incidentally noted  to have an endometrial stripe of 8-9 mm on CT scan done for hematuria, on questioning she reported intermittent spotting.  Pelvic ultrasound showed an endometrial stripe of 8.58 mm as well as a 3 cm mass in the left adnexa, suspicious for hydrosalpinx.  Endometrial biopsy from 02/17/22 returned with atrophic endometrium.  Pap 08/14/19 negative, neg HPV  The CT also showed a mass in her right kidney suspicious for renal cell cancer. She has surgery planned at the end of July.  She reports a h/o endometriosis and removal of her right ovary for a dermoid cyst.   GYNECOLOGIC HISTORY: No LMP recorded. Patient is postmenopausal. Contraception:pmp  Menopausal hormone therapy: none         OB History     Gravida  0   Para      Term      Preterm      AB      Living         SAB      IAB      Ectopic      Multiple      Live Births                 Patient Active Problem List   Diagnosis Date Noted   Left renal mass 02/18/2022   Acute hearing loss, right 08/26/2021   Chronic venous insufficiency 07/28/2021   Varicose veins of both lower extremities 06/15/2021   Statin myopathy 12/24/2020   Chronic allergic conjunctivitis 12/15/2020   Vasomotor rhinitis 12/15/2020   Abnormal urine odor 08/06/2020   AKI (acute kidney injury) ([place]) 11/06/2019   Abdominal pain 08/08/2018   Controlled type 2 diabetes mellitus without complication ([place]) 07/62/2633   URI (upper respiratory infection) 04/12/2018   Osteoarthritis of right knee 12/20/2017   Pain in right knee 11/28/2017   Pain of right calf 11/28/2017   Acute upper respiratory infection 10/05/2017   Peripheral edema 03/31/2017   Sinusitis, chronic 09/17/2016   Cough 09/09/2016   Contact dermatitis  08/28/2014   Obesity 07/02/2014   Rash and nonspecific skin eruption 07/02/2014   Bronchospasm 05/07/2014   Dysplasia of cervix, low grade (CIN 1) 09/01/2012   Urinary urgency 05/16/2012   Encounter for well adult exam with abnormal findings 07/04/2011   THYROID NODULE, LEFT 11/04/2010   Diabetes ([place]) 10/26/2010   PYELONEPHRITIS, ACUTE 09/23/2010   VAGINITIS, CANDIDAL 11/26/2009   SKIN LESION 06/19/2009   GOITER, MULTINODULAR 03/10/2009   Palpitations 03/10/2009   Headache(784.0) 11/13/2008   Urinary frequency 09/11/2008   History of colonic polyps 07/04/2008   UTI'S, RECURRENT 11/30/2007   ENDOMETRIOSIS 11/30/2007   Dysmenorrhea 11/30/2007   Rosacea 11/30/2007   GASTRIC POLYP 08/04/2007   GERD 08/04/2007   ISCHEMIC COLITIS, HX OF 07/06/2007   Hyperlipidemia 04/06/2007   ANXIETY 04/06/2007   Essential hypertension 04/06/2007   Allergic rhinitis 04/06/2007   ENDOMETRIAL POLYP 01/11/2002    Past Medical History:  Diagnosis Date   ALLERGIC RHINITIS 04/06/2007   ANXIETY 04/06/2007   Arthritis    Cataract    CIN I (cervical intraepithelial neoplasia I)    COLONIC POLYPS 08/04/2007   Diabetes mellitus without complication ([place])    DIARRHEA, ACUTE 09/23/2010   Fibroid  GASTRIC POLYP 08/04/2007   GERD 08/04/2007   GOITER, MULTINODULAR 03/10/2009   Headache(784.0) 11/13/2008   Hx of blood clots    HYPERLIPIDEMIA 04/06/2007   HYPERTENSION 04/06/2007   ISCHEMIC COLITIS, HX OF 62/83/6629   Lichen sclerosus 4765   Osteopenia 08/2018   T score -1.1 FRAX 7% / 0.5%   Palpitations 03/10/2009   Rosacea 11/30/2007   THYROID NODULE, LEFT 11/04/2010   Urinary frequency    UTI'S, RECURRENT 11/30/2007   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     Past Surgical History:  Procedure Laterality Date   APPENDECTOMY  2005   bladder tack     COLPOSCOPY     CYSTOSCOPY  2007   DERMOID CYST  EXCISION  1994   right ovary   FLEXIBLE SIGMOIDOSCOPY  11/16/95   Lt.  pectoral/subclavicular abscess drainage  2006   removed skull cyst  1973   right foot neuroma  1988   uterine fibroids removed  1996    Current Outpatient Medications  Medication Sig Dispense Refill   acetaminophen (TYLENOL) 500 MG tablet Take 500 mg by mouth every 6 (six) hours as needed.     Blood Glucose Monitoring Suppl (ONE TOUCH ULTRA 2) w/Device KIT Use to check blood sugar once daily 1 kit 0   Cholecalciferol (VITAMIN D3 PO) Take by mouth.     co-enzyme Q-10 30 MG capsule Take 100 mg by mouth daily.     glucose blood (ONETOUCH ULTRA) test strip Use to check blood sugar once daily 100 each 12   guaiFENesin (MUCINEX) 600 MG 12 hr tablet      Lancets (ONETOUCH ULTRASOFT) lancets Use to check blood sugar once daily 100 each 12   levocetirizine (XYZAL) 5 MG tablet Take 5 mg by mouth daily.     lovastatin (MEVACOR) 40 MG tablet Take 1 tablet (40 mg total) by mouth at bedtime. 90 tablet 3   magnesium 30 MG tablet Take 30 mg by mouth 1 day or 1 dose.     omeprazole (PRILOSEC) 20 MG capsule Take 20 mg by mouth daily.     potassium chloride SA (KLOR-CON M) 20 MEQ tablet TAKE 1 TABLET BY MOUTH TWICE  DAILY 180 tablet 0   valsartan-hydrochlorothiazide (DIOVAN-HCT) 320-25 MG tablet TAKE 1 TABLET BY MOUTH  DAILY 90 tablet 0   vitamin B-12 (CYANOCOBALAMIN) 500 MCG tablet Take 500 mcg by mouth daily.     Zinc 50 MG TABS Take 50 mg by mouth daily.     No current facility-administered medications for this visit.     ALLERGIES: Atorvastatin, Cefuroxime axetil, Ciprofloxacin, Crestor [rosuvastatin], Latex, Lipitor [atorvastatin calcium], and Moxifloxacin  Family History  Problem Relation Age of Onset   Diabetes Mother    Hypertension Mother    Heart disease Mother    Lung cancer Mother    Heart disease Father    Stroke Father    Emphysema Father    Stroke Brother    Deep vein thrombosis Brother    Macular degeneration Brother    Heart attack Brother    Lung cancer Maternal Uncle     Breast cancer Paternal Aunt        Age 11's   Lung cancer Maternal Grandmother    Colon cancer Neg Hx    Stomach cancer Neg Hx    Rectal cancer Neg Hx    Esophageal cancer Neg Hx    Liver cancer Neg Hx     Social History  Socioeconomic History   Marital status: Single    Spouse name: Not on file   Number of children: Not on file   Years of education: Not on file   Highest education level: Not on file  Occupational History   Not on file  Tobacco Use   Smoking status: Never   Smokeless tobacco: Never   Tobacco comments:    passive tobacco smoke exposure as a child  Vaping Use   Vaping Use: Never used  Substance and Sexual Activity   Alcohol use: Yes    Alcohol/week: 0.0 standard drinks of alcohol    Comment: Rare   Drug use: No   Sexual activity: Not Currently    Birth control/protection: Post-menopausal    Comment: 1st intercourse 66 yo-Fewer than 5 partners  Other Topics Concern   Not on file  Social History Narrative   Not on file   Social Determinants of Health   Financial Resource Strain: Not on file  Food Insecurity: Not on file  Transportation Needs: Not on file  Physical Activity: Not on file  Stress: Not on file  Social Connections: Not on file  Intimate Partner Violence: Not on file    Review of Systems  All other systems reviewed and are negative.   PHYSICAL EXAMINATION:    BP 122/62   Pulse 88   Ht 5' 1"  (1.549 m)   Wt 165 lb (74.8 kg)   SpO2 100%   BMI 31.18 kg/m     General appearance: alert, cooperative and appears stated age Neck: no adenopathy, supple, symmetrical, trachea midline and thyroid enlarged Heart: regular rate and rhythm Lungs: CTAB Abdomen: soft, non-tender; bowel sounds normal; no masses,  no organomegaly Extremities: normal, atraumatic, no cyanosis Skin: normal color, texture and turgor, no rashes or lesions Lymph: normal cervical supraclavicular and inguinal nodes Neurologic: grossly normal  1. Postmenopausal  bleeding Thickened stripe, biopsy with atrophic endometrium.  Plan: hysteroscopy, dilation and curettage. Reviewed risks, including: bleeding, infection, uterine perforation, fluid overload, need for further sugery   2. Thickened endometrium  3. Left renal mass Has surgery scheduled at the end of next month  4. Controlled type 2 diabetes mellitus without complication, unspecified whether long term insulin use (HCC) Last HgbA1C was 7? On 02/18/22  5. Essential hypertension Controlled on medication

## 2022-03-09 NOTE — Progress Notes (Signed)
GYNECOLOGY  VISIT   HPI: 66 y.o.   Single White or Caucasian Not Hispanic or Latino  female   G0P0 with No LMP recorded. Patient is postmenopausal.   here for pre op for D&C Hysteroscopy. She was incidentally noted  to have an endometrial stripe of 8-9 mm on CT scan done for hematuria, on questioning she reported intermittent spotting.  Pelvic ultrasound showed an endometrial stripe of 8.58 mm as well as a 3 cm mass in the left adnexa, suspicious for hydrosalpinx.  Endometrial biopsy from 02/17/22 returned with atrophic endometrium.  Pap 08/14/19 negative, neg HPV  The CT also showed a mass in her right kidney suspicious for renal cell cancer. She has surgery planned at the end of July.  She reports a h/o endometriosis and removal of her right ovary for a dermoid cyst.   GYNECOLOGIC HISTORY: No LMP recorded. Patient is postmenopausal. Contraception:pmp  Menopausal hormone therapy: none         OB History     Gravida  0   Para      Term      Preterm      AB      Living         SAB      IAB      Ectopic      Multiple      Live Births                 Patient Active Problem List   Diagnosis Date Noted   Left renal mass 02/18/2022   Acute hearing loss, right 08/26/2021   Chronic venous insufficiency 07/28/2021   Varicose veins of both lower extremities 06/15/2021   Statin myopathy 12/24/2020   Chronic allergic conjunctivitis 12/15/2020   Vasomotor rhinitis 12/15/2020   Abnormal urine odor 08/06/2020   AKI (acute kidney injury) ([place]) 11/06/2019   Abdominal pain 08/08/2018   Controlled type 2 diabetes mellitus without complication ([place]) 93/81/8299   URI (upper respiratory infection) 04/12/2018   Osteoarthritis of right knee 12/20/2017   Pain in right knee 11/28/2017   Pain of right calf 11/28/2017   Acute upper respiratory infection 10/05/2017   Peripheral edema 03/31/2017   Sinusitis, chronic 09/17/2016   Cough 09/09/2016   Contact dermatitis  08/28/2014   Obesity 07/02/2014   Rash and nonspecific skin eruption 07/02/2014   Bronchospasm 05/07/2014   Dysplasia of cervix, low grade (CIN 1) 09/01/2012   Urinary urgency 05/16/2012   Encounter for well adult exam with abnormal findings 07/04/2011   THYROID NODULE, LEFT 11/04/2010   Diabetes ([place]) 10/26/2010   PYELONEPHRITIS, ACUTE 09/23/2010   VAGINITIS, CANDIDAL 11/26/2009   SKIN LESION 06/19/2009   GOITER, MULTINODULAR 03/10/2009   Palpitations 03/10/2009   Headache(784.0) 11/13/2008   Urinary frequency 09/11/2008   History of colonic polyps 07/04/2008   UTI'S, RECURRENT 11/30/2007   ENDOMETRIOSIS 11/30/2007   Dysmenorrhea 11/30/2007   Rosacea 11/30/2007   GASTRIC POLYP 08/04/2007   GERD 08/04/2007   ISCHEMIC COLITIS, HX OF 07/06/2007   Hyperlipidemia 04/06/2007   ANXIETY 04/06/2007   Essential hypertension 04/06/2007   Allergic rhinitis 04/06/2007   ENDOMETRIAL POLYP 01/11/2002    Past Medical History:  Diagnosis Date   ALLERGIC RHINITIS 04/06/2007   ANXIETY 04/06/2007   Arthritis    Cataract    CIN I (cervical intraepithelial neoplasia I)    COLONIC POLYPS 08/04/2007   Diabetes mellitus without complication ([place])    DIARRHEA, ACUTE 09/23/2010   Fibroid  GASTRIC POLYP 08/04/2007   GERD 08/04/2007   GOITER, MULTINODULAR 03/10/2009   Headache(784.0) 11/13/2008   Hx of blood clots    HYPERLIPIDEMIA 04/06/2007   HYPERTENSION 04/06/2007   ISCHEMIC COLITIS, HX OF 35/45/6256   Lichen sclerosus 3893   Osteopenia 08/2018   T score -1.1 FRAX 7% / 0.5%   Palpitations 03/10/2009   Rosacea 11/30/2007   THYROID NODULE, LEFT 11/04/2010   Urinary frequency    UTI'S, RECURRENT 11/30/2007   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     Past Surgical History:  Procedure Laterality Date   APPENDECTOMY  2005   bladder tack     COLPOSCOPY     CYSTOSCOPY  2007   DERMOID CYST  EXCISION  1994   right ovary   FLEXIBLE SIGMOIDOSCOPY  11/16/95   Lt.  pectoral/subclavicular abscess drainage  2006   removed skull cyst  1973   right foot neuroma  1988   uterine fibroids removed  1996    Current Outpatient Medications  Medication Sig Dispense Refill   acetaminophen (TYLENOL) 500 MG tablet Take 500 mg by mouth every 6 (six) hours as needed.     Blood Glucose Monitoring Suppl (ONE TOUCH ULTRA 2) w/Device KIT Use to check blood sugar once daily 1 kit 0   Cholecalciferol (VITAMIN D3 PO) Take by mouth.     co-enzyme Q-10 30 MG capsule Take 100 mg by mouth daily.     glucose blood (ONETOUCH ULTRA) test strip Use to check blood sugar once daily 100 each 12   guaiFENesin (MUCINEX) 600 MG 12 hr tablet      Lancets (ONETOUCH ULTRASOFT) lancets Use to check blood sugar once daily 100 each 12   levocetirizine (XYZAL) 5 MG tablet Take 5 mg by mouth daily.     lovastatin (MEVACOR) 40 MG tablet Take 1 tablet (40 mg total) by mouth at bedtime. 90 tablet 3   magnesium 30 MG tablet Take 30 mg by mouth 1 day or 1 dose.     omeprazole (PRILOSEC) 20 MG capsule Take 20 mg by mouth daily.     potassium chloride SA (KLOR-CON M) 20 MEQ tablet TAKE 1 TABLET BY MOUTH TWICE  DAILY 180 tablet 0   valsartan-hydrochlorothiazide (DIOVAN-HCT) 320-25 MG tablet TAKE 1 TABLET BY MOUTH  DAILY 90 tablet 0   vitamin B-12 (CYANOCOBALAMIN) 500 MCG tablet Take 500 mcg by mouth daily.     Zinc 50 MG TABS Take 50 mg by mouth daily.     No current facility-administered medications for this visit.     ALLERGIES: Atorvastatin, Cefuroxime axetil, Ciprofloxacin, Crestor [rosuvastatin], Latex, Lipitor [atorvastatin calcium], and Moxifloxacin  Family History  Problem Relation Age of Onset   Diabetes Mother    Hypertension Mother    Heart disease Mother    Lung cancer Mother    Heart disease Father    Stroke Father    Emphysema Father    Stroke Brother    Deep vein thrombosis Brother    Macular degeneration Brother    Heart attack Brother    Lung cancer Maternal Uncle     Breast cancer Paternal Aunt        Age 51's   Lung cancer Maternal Grandmother    Colon cancer Neg Hx    Stomach cancer Neg Hx    Rectal cancer Neg Hx    Esophageal cancer Neg Hx    Liver cancer Neg Hx     Social History  Socioeconomic History   Marital status: Single    Spouse name: Not on file   Number of children: Not on file   Years of education: Not on file   Highest education level: Not on file  Occupational History   Not on file  Tobacco Use   Smoking status: Never   Smokeless tobacco: Never   Tobacco comments:    passive tobacco smoke exposure as a child  Vaping Use   Vaping Use: Never used  Substance and Sexual Activity   Alcohol use: Yes    Alcohol/week: 0.0 standard drinks of alcohol    Comment: Rare   Drug use: No   Sexual activity: Not Currently    Birth control/protection: Post-menopausal    Comment: 1st intercourse 66 yo-Fewer than 5 partners  Other Topics Concern   Not on file  Social History Narrative   Not on file   Social Determinants of Health   Financial Resource Strain: Not on file  Food Insecurity: Not on file  Transportation Needs: Not on file  Physical Activity: Not on file  Stress: Not on file  Social Connections: Not on file  Intimate Partner Violence: Not on file    Review of Systems  All other systems reviewed and are negative.   PHYSICAL EXAMINATION:    BP 122/62   Pulse 88   Ht 5' 1"  (1.549 m)   Wt 165 lb (74.8 kg)   SpO2 100%   BMI 31.18 kg/m     General appearance: alert, cooperative and appears stated age Neck: no adenopathy, supple, symmetrical, trachea midline and thyroid enlarged Heart: regular rate and rhythm Lungs: CTAB Abdomen: soft, non-tender; bowel sounds normal; no masses,  no organomegaly Extremities: normal, atraumatic, no cyanosis Skin: normal color, texture and turgor, no rashes or lesions Lymph: normal cervical supraclavicular and inguinal nodes Neurologic: grossly normal  1. Postmenopausal  bleeding Thickened stripe, biopsy with atrophic endometrium.  Plan: hysteroscopy, dilation and curettage. Reviewed risks, including: bleeding, infection, uterine perforation, fluid overload, need for further sugery   2. Thickened endometrium  3. Left renal mass Has surgery scheduled at the end of next month  4. Controlled type 2 diabetes mellitus without complication, unspecified whether long term insulin use (HCC) Last HgbA1C was 7? On 02/18/22  5. Essential hypertension Controlled on medication

## 2022-03-10 NOTE — Telephone Encounter (Signed)
Spoke with patient regarding surgery benefits. Patient acknowledges understanding of information presented. Patient is aware that benefits presented are professional benefits only. Patient is aware the hospital will call with facility benefits. See account note.This insurance quote is not a guarantee of benefits or claims & is subject to change upon insurance company review

## 2022-03-11 ENCOUNTER — Telehealth: Payer: Self-pay | Admitting: *Deleted

## 2022-03-11 NOTE — Telephone Encounter (Signed)
Call received from [name] [name], [place] scheduling.  Surgery for this patient has been moved to [place] on 03/22/22 at 0730.   Call placed to patient to notify.  Advised as seen above. Patient verbalizes understanding.   Routing to Dr. Rosann Auerbach.   Cc: [employer]

## 2022-03-15 ENCOUNTER — Encounter (HOSPITAL_COMMUNITY): Payer: Self-pay | Admitting: Obstetrics and Gynecology

## 2022-03-15 ENCOUNTER — Other Ambulatory Visit: Payer: Self-pay

## 2022-03-20 NOTE — Anesthesia Preprocedure Evaluation (Signed)
Anesthesia Evaluation  Patient identified by MRN, date of birth, ID band Patient awake    Reviewed: Allergy & Precautions, NPO status , Patient's Chart, lab work & pertinent test results  Airway Mallampati: II  TM Distance: >3 FB Neck ROM: Full    Dental  (+) Dental Advisory Given, Teeth Intact   Pulmonary neg pulmonary ROS,    Pulmonary exam normal breath sounds clear to auscultation       Cardiovascular hypertension, Normal cardiovascular exam Rhythm:Regular Rate:Normal     Neuro/Psych  Headaches, PSYCHIATRIC DISORDERS Anxiety  Neuromuscular disease    GI/Hepatic Neg liver ROS, GERD  ,  Endo/Other  negative endocrine ROSdiabetes  Renal/GU Renal disease     Musculoskeletal  (+) Arthritis ,   Abdominal   Peds  Hematology negative hematology ROS (+)   Anesthesia Other Findings   Reproductive/Obstetrics                            Anesthesia Physical Anesthesia Plan  ASA: 3  Anesthesia Plan: General   Post-op Pain Management: Tylenol PO (pre-op)* and Toradol IV (intra-op)*   Induction: Intravenous  PONV Risk Score and Plan: 4 or greater and Midazolam, Treatment may vary due to age or medical condition, Ondansetron and Dexamethasone  Airway Management Planned: LMA  Additional Equipment:   Intra-op Plan:   Post-operative Plan: Extubation in OR  Informed Consent: I have reviewed the patients History and Physical, chart, labs and discussed the procedure including the risks, benefits and alternatives for the proposed anesthesia with the patient or authorized representative who has indicated his/her understanding and acceptance.     Dental advisory given  Plan Discussed with: CRNA  Anesthesia Plan Comments:       Anesthesia Quick Evaluation

## 2022-03-22 ENCOUNTER — Ambulatory Visit (HOSPITAL_COMMUNITY): Payer: 59 | Admitting: Anesthesiology

## 2022-03-22 ENCOUNTER — Encounter (HOSPITAL_COMMUNITY)
Admission: RE | Disposition: A | Discharge status: Home / Self Care | Payer: Self-pay | Source: Home / Self Care | Attending: Obstetrics and Gynecology

## 2022-03-22 ENCOUNTER — Ambulatory Visit (HOSPITAL_COMMUNITY)
Admission: RE | Admit: 2022-03-22 | Discharge: 2022-03-22 | Disposition: A | Discharge status: Home / Self Care | Payer: 59 | Attending: Obstetrics and Gynecology | Admitting: Obstetrics and Gynecology

## 2022-03-22 ENCOUNTER — Other Ambulatory Visit: Payer: Self-pay

## 2022-03-22 ENCOUNTER — Ambulatory Visit (HOSPITAL_BASED_OUTPATIENT_CLINIC_OR_DEPARTMENT_OTHER): Payer: 59 | Admitting: Anesthesiology

## 2022-03-22 ENCOUNTER — Encounter (HOSPITAL_COMMUNITY): Payer: Self-pay | Admitting: Obstetrics and Gynecology

## 2022-03-22 DIAGNOSIS — N2889 Other specified disorders of kidney and ureter: Secondary | ICD-10-CM | POA: Insufficient documentation

## 2022-03-22 DIAGNOSIS — N950 Postmenopausal bleeding: Secondary | ICD-10-CM

## 2022-03-22 DIAGNOSIS — I10 Essential (primary) hypertension: Secondary | ICD-10-CM | POA: Insufficient documentation

## 2022-03-22 DIAGNOSIS — R9389 Abnormal findings on diagnostic imaging of other specified body structures: Secondary | ICD-10-CM | POA: Diagnosis not present

## 2022-03-22 DIAGNOSIS — Z780 Asymptomatic menopausal state: Secondary | ICD-10-CM | POA: Diagnosis not present

## 2022-03-22 DIAGNOSIS — I2510 Atherosclerotic heart disease of native coronary artery without angina pectoris: Secondary | ICD-10-CM

## 2022-03-22 DIAGNOSIS — M1990 Unspecified osteoarthritis, unspecified site: Secondary | ICD-10-CM | POA: Insufficient documentation

## 2022-03-22 DIAGNOSIS — E119 Type 2 diabetes mellitus without complications: Secondary | ICD-10-CM | POA: Diagnosis not present

## 2022-03-22 DIAGNOSIS — Z8249 Family history of ischemic heart disease and other diseases of the circulatory system: Secondary | ICD-10-CM | POA: Insufficient documentation

## 2022-03-22 DIAGNOSIS — K219 Gastro-esophageal reflux disease without esophagitis: Secondary | ICD-10-CM | POA: Insufficient documentation

## 2022-03-22 DIAGNOSIS — N840 Polyp of corpus uteri: Secondary | ICD-10-CM | POA: Diagnosis not present

## 2022-03-22 DIAGNOSIS — Z833 Family history of diabetes mellitus: Secondary | ICD-10-CM | POA: Diagnosis not present

## 2022-03-22 DIAGNOSIS — G709 Myoneural disorder, unspecified: Secondary | ICD-10-CM | POA: Insufficient documentation

## 2022-03-22 HISTORY — DX: Other specified disorders of kidney and ureter: N28.89

## 2022-03-22 HISTORY — DX: Family history of other specified conditions: Z84.89

## 2022-03-22 HISTORY — PX: DILATATION & CURETTAGE/HYSTEROSCOPY WITH MYOSURE: SHX6511

## 2022-03-22 LAB — GLUCOSE, CAPILLARY: Glucose-Capillary: 142 mg/dL — ABNORMAL HIGH (ref 70–99)

## 2022-03-22 LAB — CBC WITH DIFFERENTIAL/PLATELET
Abs Immature Granulocytes: 0.01 10*3/uL (ref 0.00–0.07)
Basophils Absolute: 0.1 10*3/uL (ref 0.0–0.1)
Basophils Relative: 1 %
Eosinophils Absolute: 0.2 10*3/uL (ref 0.0–0.5)
Eosinophils Relative: 4 %
HCT: 36.1 % (ref 36.0–46.0)
Hemoglobin: 12 g/dL (ref 12.0–15.0)
Immature Granulocytes: 0 %
Lymphocytes Relative: 42 %
Lymphs Abs: 2.4 10*3/uL (ref 0.7–4.0)
MCH: 28.2 pg (ref 26.0–34.0)
MCHC: 33.2 g/dL (ref 30.0–36.0)
MCV: 84.9 fL (ref 80.0–100.0)
Monocytes Absolute: 0.6 10*3/uL (ref 0.1–1.0)
Monocytes Relative: 10 %
Neutro Abs: 2.5 10*3/uL (ref 1.7–7.7)
Neutrophils Relative %: 43 %
Platelets: 200 10*3/uL (ref 150–400)
RBC: 4.25 MIL/uL (ref 3.87–5.11)
RDW: 13.2 % (ref 11.5–15.5)
WBC: 5.8 10*3/uL (ref 4.0–10.5)
nRBC: 0 % (ref 0.0–0.2)

## 2022-03-22 LAB — BASIC METABOLIC PANEL
Anion gap: 10 (ref 5–15)
BUN: 15 mg/dL (ref 8–23)
CO2: 24 mmol/L (ref 22–32)
Calcium: 9.4 mg/dL (ref 8.9–10.3)
Chloride: 105 mmol/L (ref 98–111)
Creatinine, Ser: 0.68 mg/dL (ref 0.44–1.00)
GFR, Estimated: >60 mL/min (ref >60)
Glucose, Bld: 139 mg/dL — ABNORMAL HIGH (ref 70–99)
Potassium: 3.8 mmol/L (ref 3.5–5.1)
Sodium: 139 mmol/L (ref 135–145)

## 2022-03-22 SURGERY — DILATATION & CURETTAGE/HYSTEROSCOPY WITH MYOSURE
Anesthesia: General | Site: Vagina

## 2022-03-22 MED ORDER — ONDANSETRON HCL 4 MG/2ML IJ SOLN
INTRAMUSCULAR | Status: AC
  Filled 2022-03-22: qty 2

## 2022-03-22 MED ORDER — PROPOFOL 10 MG/ML IV BOLUS
INTRAVENOUS | Status: AC
  Filled 2022-03-22: qty 20

## 2022-03-22 MED ORDER — SODIUM CHLORIDE 0.9 % IR SOLN
Status: DC | PRN
  Administered 2022-03-22: 3000 mL

## 2022-03-22 MED ORDER — FENTANYL CITRATE (PF) 100 MCG/2ML IJ SOLN
INTRAMUSCULAR | Status: DC | PRN
  Administered 2022-03-22 (×2): 50 ug via INTRAVENOUS

## 2022-03-22 MED ORDER — ACETAMINOPHEN 500 MG PO TABS
1000.0000 mg | ORAL_TABLET | ORAL | Status: AC
  Administered 2022-03-22: 1000 mg via ORAL

## 2022-03-22 MED ORDER — ROCURONIUM BROMIDE 10 MG/ML (PF) SYRINGE
PREFILLED_SYRINGE | INTRAVENOUS | Status: AC
  Filled 2022-03-22: qty 10

## 2022-03-22 MED ORDER — LACTATED RINGERS IV SOLN: INTRAVENOUS | Status: DC

## 2022-03-22 MED ORDER — MEPERIDINE HCL 50 MG/ML IJ SOLN
INTRAMUSCULAR | Status: AC
  Filled 2022-03-22: qty 1

## 2022-03-22 MED ORDER — AMISULPRIDE (ANTIEMETIC) 5 MG/2ML IV SOLN: 10.0000 mg | Freq: Once | INTRAVENOUS | Status: DC | PRN

## 2022-03-22 MED ORDER — KETOROLAC TROMETHAMINE 30 MG/ML IJ SOLN
INTRAMUSCULAR | Status: DC | PRN
  Administered 2022-03-22: 30 mg via INTRAVENOUS

## 2022-03-22 MED ORDER — ORAL CARE MOUTH RINSE: 15.0000 mL | Freq: Once | OROMUCOSAL | Status: AC

## 2022-03-22 MED ORDER — LIDOCAINE HCL (PF) 2 % IJ SOLN
INTRAMUSCULAR | Status: AC
  Filled 2022-03-22: qty 5

## 2022-03-22 MED ORDER — ACETAMINOPHEN 500 MG PO TABS
1000.0000 mg | ORAL_TABLET | Freq: Once | ORAL | Status: DC
  Filled 2022-03-22: qty 2

## 2022-03-22 MED ORDER — MIDAZOLAM HCL 2 MG/2ML IJ SOLN
INTRAMUSCULAR | Status: AC
  Filled 2022-03-22: qty 2

## 2022-03-22 MED ORDER — FENTANYL CITRATE (PF) 250 MCG/5ML IJ SOLN
INTRAMUSCULAR | Status: AC
  Filled 2022-03-22: qty 5

## 2022-03-22 MED ORDER — MIDAZOLAM HCL 5 MG/5ML IJ SOLN
INTRAMUSCULAR | Status: DC | PRN
  Administered 2022-03-22: 2 mg via INTRAVENOUS

## 2022-03-22 MED ORDER — DEXAMETHASONE SODIUM PHOSPHATE 10 MG/ML IJ SOLN
INTRAMUSCULAR | Status: DC | PRN
  Administered 2022-03-22: 5 mg via INTRAVENOUS

## 2022-03-22 MED ORDER — FENTANYL CITRATE PF 50 MCG/ML IJ SOSY
PREFILLED_SYRINGE | INTRAMUSCULAR | Status: AC
  Administered 2022-03-22: 50 ug via INTRAVENOUS
  Filled 2022-03-22: qty 1

## 2022-03-22 MED ORDER — MEPERIDINE HCL 50 MG/ML IJ SOLN
6.2500 mg | INTRAMUSCULAR | Status: DC | PRN
  Administered 2022-03-22: 12.5 mg via INTRAVENOUS

## 2022-03-22 MED ORDER — KETOROLAC TROMETHAMINE 30 MG/ML IJ SOLN
INTRAMUSCULAR | Status: AC
  Filled 2022-03-22: qty 3

## 2022-03-22 MED ORDER — DEXAMETHASONE SODIUM PHOSPHATE 10 MG/ML IJ SOLN
INTRAMUSCULAR | Status: AC
  Filled 2022-03-22: qty 1

## 2022-03-22 MED ORDER — ONDANSETRON HCL 4 MG/2ML IJ SOLN
INTRAMUSCULAR | Status: DC | PRN
  Administered 2022-03-22: 4 mg via INTRAVENOUS

## 2022-03-22 MED ORDER — LIDOCAINE 2% (20 MG/ML) 5 ML SYRINGE
INTRAMUSCULAR | Status: DC | PRN
  Administered 2022-03-22: 60 mg via INTRAVENOUS

## 2022-03-22 MED ORDER — CHLORHEXIDINE GLUCONATE 0.12 % MT SOLN
15.0000 mL | Freq: Once | OROMUCOSAL | Status: AC
  Administered 2022-03-22: 15 mL via OROMUCOSAL

## 2022-03-22 MED ORDER — PROPOFOL 10 MG/ML IV BOLUS
INTRAVENOUS | Status: DC | PRN
  Administered 2022-03-22: 150 mg via INTRAVENOUS
  Administered 2022-03-22: 50 mg via INTRAVENOUS

## 2022-03-22 MED ORDER — FENTANYL CITRATE PF 50 MCG/ML IJ SOSY: 25.0000 ug | PREFILLED_SYRINGE | INTRAMUSCULAR | Status: DC | PRN

## 2022-03-22 MED ORDER — FENTANYL CITRATE (PF) 100 MCG/2ML IJ SOLN
INTRAMUSCULAR | Status: AC
  Filled 2022-03-22: qty 2

## 2022-03-22 MED ORDER — ARTIFICIAL TEARS OPHTHALMIC OINT
TOPICAL_OINTMENT | OPHTHALMIC | Status: AC
Start: 2022-03-22 — End: ?
  Filled 2022-03-22: qty 3.5

## 2022-03-22 SURGICAL SUPPLY — 25 items
BAG COUNTER SPONGE SURGICOUNT (BAG) IMPLANT
BAG SPNG CNTER NS LX DISP (BAG)
CANISTER SUCT 3000ML PPV (MISCELLANEOUS) ×4 IMPLANT
CATH ROBINSON RED A/P 16FR (CATHETERS) IMPLANT
COUNTER NEEDLE 20 DBL MAG RED (NEEDLE) ×2 IMPLANT
DEVICE MYOSURE LITE (MISCELLANEOUS) IMPLANT
DEVICE MYOSURE REACH (MISCELLANEOUS) ×1 IMPLANT
DILATOR CANAL MILEX (MISCELLANEOUS) IMPLANT
FILTER ARTHROSCOPY CONVERTOR (FILTER) ×3 IMPLANT
GLOVE BIO SURGEON STRL SZ 6.5 (GLOVE) ×3 IMPLANT
GOWN STRL REUS W/ TWL LRG LVL3 (GOWN DISPOSABLE) ×2 IMPLANT
GOWN STRL REUS W/TWL LRG LVL3 (GOWN DISPOSABLE) ×2
IV NS IRRIG 3000ML ARTHROMATIC (IV SOLUTION) ×5 IMPLANT
KIT PROCEDURE FLUENT (KITS) ×1 IMPLANT
MYOSURE XL FIBROID (MISCELLANEOUS)
PACK LITHOTOMY IV (CUSTOM PROCEDURE TRAY) ×2 IMPLANT
PACK VAGINAL WOMENS (CUSTOM PROCEDURE TRAY) ×1 IMPLANT
PAD OB MATERNITY 4.3X12.25 (PERSONAL CARE ITEMS) ×3 IMPLANT
PENCIL SMOKE EVACUATOR (MISCELLANEOUS) IMPLANT
SEAL ROD LENS SCOPE MYOSURE (ABLATOR) ×3 IMPLANT
SYR 20ML LL LF (SYRINGE) IMPLANT
SYSTEM TISS REMOVAL MYOSURE XL (MISCELLANEOUS) IMPLANT
TOWEL OR 17X26 10 PK STRL BLUE (TOWEL DISPOSABLE) ×3 IMPLANT
UNDERPAD 30X36 HEAVY ABSORB (UNDERPADS AND DIAPERS) ×3 IMPLANT
WATER STERILE IRR 500ML POUR (IV SOLUTION) IMPLANT

## 2022-03-22 NOTE — Interval H&P Note (Signed)
History and Physical Interval Note:  03/22/2022 7:15 AM  [name] [name]  has presented today for surgery, with the diagnosis of thickend endometrium, postmenopausal bleeding.  The various methods of treatment have been discussed with the patient and family. After consideration of risks, benefits and other options for treatment, the patient has consented to  Procedure(s): [place] (N/A) as a surgical intervention.  The patient's history has been reviewed, patient examined, no change in status, stable for surgery.  I have reviewed the patient's chart and labs.  Questions were answered to the patient's satisfaction.     Salvadore Dom

## 2022-03-22 NOTE — Anesthesia Postprocedure Evaluation (Signed)
Anesthesia Post Note  Patient: [name] [name]  Procedure(s) Performed: DILATATION & CURETTAGE/HYSTEROSCOPY WITH MYOSURE (Vagina )     Patient location during evaluation: PACU Anesthesia Type: General Level of consciousness: sedated and patient cooperative Pain management: pain level controlled Vital Signs Assessment: post-procedure vital signs reviewed and stable Respiratory status: spontaneous breathing Cardiovascular status: stable Anesthetic complications: no   No notable events documented.  Last Vitals:  Vitals:   03/22/22 0915 03/22/22 0930  BP: 140/84 (!) 151/83  Pulse: 68 64  Resp: 17 20  Temp: 36.7 C   SpO2: 94% 97%    Last Pain:  Vitals:   03/22/22 0930  TempSrc:   PainSc: 0-No pain                 Nolon Nations

## 2022-03-22 NOTE — Anesthesia Procedure Notes (Signed)
Procedure Name: LMA Insertion Date/Time: 03/22/2022 7:33 AM  Performed by: Rogers Blocker, CRNAPre-anesthesia Checklist: Patient identified, Emergency Drugs available, Suction available and Patient being monitored Patient Re-evaluated:Patient Re-evaluated prior to induction Oxygen Delivery Method: Circle System Utilized Preoxygenation: Pre-oxygenation with 100% oxygen Induction Type: IV induction Ventilation: Mask ventilation without difficulty LMA: LMA inserted LMA Size: 4.0 Number of attempts: 1 Placement Confirmation: positive ETCO2 Tube secured with: Tape Dental Injury: Teeth and Oropharynx as per pre-operative assessment

## 2022-03-22 NOTE — Op Note (Signed)
Preoperative Diagnosis: postmenopausal bleeding, thickened endometrium   Postoperative Diagnosis: same  Procedure: Hysteroscopy, dilation and curettage  Surgeon: Dr Sumner Boast  Assistants: None  Anesthesia: General via LMA  EBL: 3 cc  Fluids: 600 cc  Fluid deficit: 290 cc  Urine output: 200 cc  Indications for surgery: The patient is a 66 yo female, who presented with an incidental finding of a thickened endometrium and complaints of postmenopausal spotting. Work up included an endometrial biopsy with atrophic endometrium and an ultrasound with an endometrial stripe of 8.58 mm.  She had a normal pap in 11/20.  The risks of the surgery were reviewed with the patient and the consent form was signed prior to her surgery.  Findings: EUA: small, anteverted uterus, no adnexal masses. Hysteroscopy: focally thickened endometrium on the anterior, left lateral and posterior uterine wall. Otherwise thin endometrium. Normal tubal ostia bilaterally.  Specimens: Endometrial curettings   Procedure: The patient was taken to the operating room with an IV in place. She was placed in the dorsal lithotomy position and anesthesia was administered. She was prepped and draped in the usual sterile fashion for a vaginal procedure. She was in and out catheterized.  A speculum was placed in the vagina and a single tooth tenaculum was placed on the anterior lip of the cervix. The cervix was dilated to a #6 hagar dilator. The uterus was sounded to 6 cm. The myosure hysteroscope was inserted into the uterine cavity. With continuous infusion of normal saline, the uterine cavity was visualized with the above findings. The myosure reach was used to resect the thickened endometrium. The myosure was then removed. The cavity was then curetted with the small sharp curette. The cavity had the characteristically gritty texture at the end of the procedure. The curette and the single tooth tenaculum were removed. The speculum  was removed. The patients perineum was cleansed of betadine and she was taken out of the dorsal lithotomy position.  Upon awakening the LMA was removed and the patient was transferred to the recovery room in stable and awake condition.  The sponge and instrument count were correct. There were no complications.

## 2022-03-22 NOTE — Transfer of Care (Signed)
Immediate Anesthesia Transfer of Care Note  Patient: [name] [name]  Procedure(s) Performed: DILATATION & CURETTAGE/HYSTEROSCOPY WITH MYOSURE (Vagina )  Patient Location: PACU  Anesthesia Type:General  Level of Consciousness: drowsy, patient cooperative and responds to stimulation  Airway & Oxygen Therapy: Patient Spontanous Breathing with supplemental O2  Post-op Assessment: Report given to RN and Post -op Vital signs reviewed and stable  Post vital signs: Reviewed and stable  Last Vitals:  Vitals Value Taken Time  BP 153/89 03/22/22 0810  Temp    Pulse 84 03/22/22 0812  Resp 17 03/22/22 0812  SpO2 94 % 03/22/22 0812  Vitals shown include unvalidated device data.  Last Pain:  Vitals:   03/22/22 0606  TempSrc:   PainSc: 0-No pain      Patients Stated Pain Goal: 4 (32/91/91 6606)  Complications: No notable events documented.

## 2022-03-23 ENCOUNTER — Encounter (HOSPITAL_COMMUNITY): Payer: Self-pay | Admitting: Obstetrics and Gynecology

## 2022-03-24 LAB — SURGICAL PATHOLOGY

## 2022-03-29 ENCOUNTER — Ambulatory Visit (INDEPENDENT_AMBULATORY_CARE_PROVIDER_SITE_OTHER): Payer: 59 | Admitting: Obstetrics and Gynecology

## 2022-03-29 ENCOUNTER — Encounter: Payer: Self-pay | Admitting: Obstetrics and Gynecology

## 2022-03-29 VITALS — BP 120/68 | HR 77 | Wt 165.0 lb

## 2022-03-29 DIAGNOSIS — Z9889 Other specified postprocedural states: Secondary | ICD-10-CM

## 2022-03-29 NOTE — Progress Notes (Signed)
GYNECOLOGY  VISIT   HPI: 66 y.o.   Single White or Caucasian Not Hispanic or Latino  female   G0P0 with No LMP recorded. Patient is postmenopausal.   here for follow up from d&c. She states that she has not noticed any blood today. She states that she did have some spotting yesterday. She also says that she has not had much pain to speak of.  She is one week s/p hysteroscopy, D&C. Pathology with benign polyp and inactive endometrium.    GYNECOLOGIC HISTORY: No LMP recorded. Patient is postmenopausal. Contraception:pmp  Menopausal hormone therapy: none         OB History     Gravida  0   Para      Term      Preterm      AB      Living         SAB      IAB      Ectopic      Multiple      Live Births                 Patient Active Problem List   Diagnosis Date Noted   Left renal mass 02/18/2022   Acute hearing loss, right 08/26/2021   Chronic venous insufficiency 07/28/2021   Varicose veins of both lower extremities 06/15/2021   Statin myopathy 12/24/2020   Chronic allergic conjunctivitis 12/15/2020   Vasomotor rhinitis 12/15/2020   Abnormal urine odor 08/06/2020   AKI (acute kidney injury) ([place]) 11/06/2019   Abdominal pain 08/08/2018   Controlled type 2 diabetes mellitus without complication ([place]) 95/18/8416   URI (upper respiratory infection) 04/12/2018   Osteoarthritis of right knee 12/20/2017   Pain in right knee 11/28/2017   Pain of right calf 11/28/2017   Acute upper respiratory infection 10/05/2017   Peripheral edema 03/31/2017   Sinusitis, chronic 09/17/2016   Cough 09/09/2016   Contact dermatitis 08/28/2014   Obesity 07/02/2014   Rash and nonspecific skin eruption 07/02/2014   Bronchospasm 05/07/2014   Dysplasia of cervix, low grade (CIN 1) 09/01/2012   Urinary urgency 05/16/2012   Encounter for well adult exam with abnormal findings 07/04/2011   THYROID NODULE, LEFT 11/04/2010   Diabetes ([place]) 10/26/2010   PYELONEPHRITIS, ACUTE  09/23/2010   VAGINITIS, CANDIDAL 11/26/2009   SKIN LESION 06/19/2009   GOITER, MULTINODULAR 03/10/2009   Palpitations 03/10/2009   Headache(784.0) 11/13/2008   Urinary frequency 09/11/2008   History of colonic polyps 07/04/2008   UTI'S, RECURRENT 11/30/2007   ENDOMETRIOSIS 11/30/2007   Dysmenorrhea 11/30/2007   Rosacea 11/30/2007   GASTRIC POLYP 08/04/2007   GERD 08/04/2007   ISCHEMIC COLITIS, HX OF 07/06/2007   Hyperlipidemia 04/06/2007   ANXIETY 04/06/2007   Essential hypertension 04/06/2007   Allergic rhinitis 04/06/2007   ENDOMETRIAL POLYP 01/11/2002    Past Medical History:  Diagnosis Date   ALLERGIC RHINITIS 04/06/2007   ANXIETY 04/06/2007   Arthritis    Cataract    CIN I (cervical intraepithelial neoplasia I)    COLONIC POLYPS 08/04/2007   Diabetes mellitus without complication ([place])    DIARRHEA, ACUTE 09/23/2010   Family history of adverse reaction to anesthesia    Mother and brother hard to wake up after anesthesia   Fibroid    GASTRIC POLYP 08/04/2007   GERD 08/04/2007   GOITER, MULTINODULAR 03/10/2009   Headache(784.0) 11/13/2008   Hx of blood clots    HYPERLIPIDEMIA 04/06/2007   HYPERTENSION 04/06/2007   ISCHEMIC COLITIS, HX OF  66/44/0347   Lichen sclerosus 4259   Osteopenia 08/2018   T score -1.1 FRAX 7% / 0.5%   Palpitations 03/10/2009   Renal mass    Left kidney   Rosacea 11/30/2007   THYROID NODULE, LEFT 11/04/2010   Urinary frequency    UTI'S, RECURRENT 11/30/2007   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     Past Surgical History:  Procedure Laterality Date   APPENDECTOMY  09/21/2003   BIOPSY THYROID     x2   bladder tack     COLPOSCOPY     CYSTOSCOPY  09/20/2005   DERMOID CYST  EXCISION  09/20/1992   right ovary   DILATATION & CURETTAGE/HYSTEROSCOPY WITH MYOSURE N/A 03/22/2022   Procedure: DILATATION & CURETTAGE/HYSTEROSCOPY WITH MYOSURE;  Surgeon: Salvadore Dom, MD;  Location: [facility];  Service: Gynecology;   Laterality: N/A;   FLEXIBLE SIGMOIDOSCOPY  11/16/1995   Lt. pectoral/subclavicular abscess drainage  09/20/2004   removed skull cyst  09/21/1971   right foot neuroma  09/20/1986   uterine fibroids removed  09/20/1994    Current Outpatient Medications  Medication Sig Dispense Refill   acetaminophen (TYLENOL) 500 MG tablet Take 500-1,000 mg by mouth every 6 (six) hours as needed (for pain.).     Blood Glucose Monitoring Suppl (ONE TOUCH ULTRA 2) w/Device KIT Use to check blood sugar once daily 1 kit 0   Cholecalciferol (VITAMIN D3 PO) Take 5,000 Units by mouth in the morning.     Coenzyme Q10 200 MG capsule Take 200 mg by mouth in the morning.     Glucosamine-Chondroit-Vit C-Mn (FLEX-DS PO) Take 1 tablet by mouth in the morning and at bedtime. [employer] TriFlex     glucose blood (ONETOUCH ULTRA) test strip Use to check blood sugar once daily 100 each 12   guaiFENesin (MUCINEX) 600 MG 12 hr tablet Take 600 mg by mouth in the morning.     Lancets (ONETOUCH ULTRASOFT) lancets Use to check blood sugar once daily 100 each 12   levocetirizine (XYZAL) 5 MG tablet Take 5 mg by mouth every evening.     lovastatin (MEVACOR) 40 MG tablet Take 1 tablet (40 mg total) by mouth at bedtime. (Patient taking differently: Take 20 mg by mouth at bedtime.) 90 tablet 3   Magnesium 400 MG TABS Take 400 mg by mouth in the morning.     Menthol, Topical Analgesic, (BIOFREEZE EX) Apply 1 Application topically 3 (three) times daily as needed (muscle pain/aches.).     omeprazole (PRILOSEC) 20 MG capsule Take 20 mg by mouth daily as needed (indigestion/heartburn.).     potassium chloride SA (KLOR-CON M) 20 MEQ tablet TAKE 1 TABLET BY MOUTH TWICE  DAILY 180 tablet 0   trolamine salicylate (ASPERCREME) 10 % cream Apply 1 Application topically as needed for muscle pain.     valsartan-hydrochlorothiazide (DIOVAN-HCT) 320-25 MG tablet TAKE 1 TABLET BY MOUTH  DAILY 90 tablet 0   Zinc 50 MG TABS Take 50 mg by mouth in the morning.      No current facility-administered medications for this visit.     ALLERGIES: Avelox [moxifloxacin], Cefuroxime axetil, Ciprofloxacin, Crab extract allergy skin test, Crestor [rosuvastatin], Lipitor [atorvastatin calcium], and Latex  Family History  Problem Relation Age of Onset   Diabetes Mother    Hypertension Mother    Heart disease Mother    Lung cancer Mother    Heart disease Father    Stroke Father    Emphysema Father  Stroke Brother    Deep vein thrombosis Brother    Macular degeneration Brother    Heart attack Brother    Lung cancer Maternal Uncle    Breast cancer Paternal Aunt        Age 59's   Lung cancer Maternal Grandmother    Colon cancer Neg Hx    Stomach cancer Neg Hx    Rectal cancer Neg Hx    Esophageal cancer Neg Hx    Liver cancer Neg Hx     Social History   Socioeconomic History   Marital status: Single    Spouse name: Not on file   Number of children: Not on file   Years of education: Not on file   Highest education level: Not on file  Occupational History   Not on file  Tobacco Use   Smoking status: Never   Smokeless tobacco: Never   Tobacco comments:    passive tobacco smoke exposure as a child  Vaping Use   Vaping Use: Never used  Substance and Sexual Activity   Alcohol use: Yes    Alcohol/week: 0.0 standard drinks of alcohol    Comment: Rare   Drug use: No   Sexual activity: Not Currently    Birth control/protection: Post-menopausal    Comment: 1st intercourse 66 yo-Fewer than 5 partners  Other Topics Concern   Not on file  Social History Narrative   Not on file   Social Determinants of Health   Financial Resource Strain: Not on file  Food Insecurity: Not on file  Transportation Needs: Not on file  Physical Activity: Not on file  Stress: Not on file  Social Connections: Not on file  Intimate Partner Violence: Not on file    Review of Systems  All other systems reviewed and are negative.   PHYSICAL EXAMINATION:     BP 120/68   Pulse 77   Wt 165 lb (74.8 kg)   SpO2 100%   BMI 31.18 kg/m     General appearance: alert, cooperative and appears stated age Abdomen: soft, non-tender; non distended, no masses,  no organomegaly  1. History of hysteroscopy Pathology with benign polyp and inactive endometrium. She is doing well Routine f/u  Addendum: She is having a robotic nephrectomy with Dr Lovena Neighbours in a few weeks, she will ask him to take a picture of her left adnexa (suspected hydrosalpinx). Depending on those images will either go ahead with or cancel her f/u ultrasound.

## 2022-03-31 ENCOUNTER — Other Ambulatory Visit (HOSPITAL_COMMUNITY): Payer: Self-pay | Admitting: Urology

## 2022-04-02 ENCOUNTER — Telehealth: Payer: Self-pay | Admitting: Internal Medicine

## 2022-04-02 MED ORDER — CONTOUR BLOOD GLUCOSE SYSTEM W/DEVICE KIT
PACK | 0 refills | Status: AC
Start: 2022-04-02 — End: ?

## 2022-04-02 MED ORDER — CONTOUR TEST VI STRP: ORAL_STRIP | 3 refills | Status: DC

## 2022-04-02 NOTE — Telephone Encounter (Signed)
Prescription sent to pharmacy.

## 2022-04-02 NOTE — Telephone Encounter (Signed)
Pt states insurance  will not pay for Ultra 2 glucose meter and supplies. Contour is covered please send in rx for contour meter and supplies.  [facility] ([place], [place] Phone:  [phone]  Fax:  [phone]

## 2022-04-05 NOTE — Patient Instructions (Signed)
DUE TO COVID-19 ONLY TWO VISITORS  (aged 66 and older)  IS ALLOWED TO COME WITH YOU AND STAY IN THE WAITING ROOM ONLY DURING PRE OP AND PROCEDURE.   **NO VISITORS ARE ALLOWED IN THE SHORT STAY AREA OR RECOVERY ROOM!!**  IF YOU WILL BE ADMITTED INTO THE HOSPITAL YOU ARE ALLOWED ONLY FOUR SUPPORT PEOPLE DURING VISITATION HOURS ONLY (7 AM -8PM)   The support person(s) must pass our screening, gel in and out Visitors GUEST BADGE MUST BE WORN VISIBLY  One adult visitor may remain with you overnight and MUST be in the room by 8 P.M.   You are not required to [employer] often Do NOT share personal items Notify your provider if you are in close contact with someone who has COVID or you develop fever 100.4 or greater, new onset of sneezing, cough, sore throat, shortness of breath or body aches.        Your procedure is scheduled on:  04-16-22   Report to [facility] Main Entrance    Report to admitting at 10:15 AM   Call this number if you have problems the morning of surgery [phone]   Follow a clear liquid diet the day before surgery   Do not eat food :After Midnight the night before surgery   After Midnight you may have the following liquids until 6:30 AM DAY OF SURGERY  Clear Liquid Diet Water Black Coffee (sugar ok, NO MILK/CREAM OR CREAMERS)  Tea (sugar ok, NO MILK/CREAM OR CREAMERS) regular and decaf                             Plain Jell-O (NO RED)                                           Fruit ices (not with fruit pulp, NO RED)                                     Popsicles (NO RED)                                                                  Juice: apple, WHITE grape, WHITE cranberry Sports drinks like Gatorade (NO RED) Clear broth(vegetable,chicken,beef)                    If you have questions, please contact your surgeon's office.   FOLLOW BOWEL PREP AND ANY ADDITIONAL PRE OP INSTRUCTIONS YOU RECEIVED FROM YOUR SURGEON'S OFFICE!!!      Oral Hygiene is also important to reduce your risk of infection.                                    Remember - BRUSH YOUR TEETH THE MORNING OF SURGERY WITH YOUR REGULAR TOOTHPASTE   Do NOT smoke after Midnight   Take these medicines the morning of surgery with A SIP OF WATER:  Tylenol, Omeprazole   DO  NOT [place]. PHARMACY WILL DISPENSE MEDICATIONS LISTED ON YOUR MEDICATION LIST TO YOU DURING YOUR ADMISSION [place]!  DO NOT TAKE ANY ORAL DIABETIC MEDICATIONS DAY OF YOUR SURGERY  Bring CPAP mask and tubing day of surgery.                              You may not have any metal on your body including hair pins, jewelry, and body piercing             Do not wear make-up, lotions, powders, perfumes or deodorant  Do not wear nail polish including gel and S&S, artificial/acrylic nails, or any other type of covering on natural nails including finger and toenails. If you have artificial nails, gel coating, etc. that needs to be removed by a nail salon please have this removed prior to surgery or surgery may need to be canceled/ delayed if the surgeon/ anesthesia feels like they are unable to be safely monitored.   Do not shave  48 hours prior to surgery.            Contacts, dentures or bridgework may not be worn into surgery.   Bring small overnight bag day of surgery.  Do not bring valuables to the hospital. [place].  Special Instructions: Bring a copy of your healthcare power of attorney and living will documents the day of surgery if you haven't scanned them before.  Please read over the following fact sheets you were given: IF YOU HAVE QUESTIONS ABOUT YOUR PRE-OP INSTRUCTIONS PLEASE CALL [facility] - Preparing for Surgery Before surgery, you can play an important role.  Because skin is not sterile, your skin needs to be as free of germs as possible.  You can reduce the number of  germs on your skin by washing with CHG (chlorahexidine gluconate) soap before surgery.  CHG is an antiseptic cleaner which kills germs and bonds with the skin to continue killing germs even after washing. Please DO NOT use if you have an allergy to CHG or antibacterial soaps.  If your skin becomes reddened/irritated stop using the CHG and inform your nurse when you arrive at Short Stay. Do not shave (including legs and underarms) for at least 48 hours prior to the first CHG shower.  You may shave your face/neck.  Please follow these instructions carefully:  1.  Shower with CHG Soap the night before surgery and the  morning of surgery.  2.  If you choose to wash your hair, wash your hair first as usual with your normal  shampoo.  3.  After you shampoo, rinse your hair and body thoroughly to remove the shampoo.                             4.  Use CHG as you would any other liquid soap.  You can apply chg directly to the skin and wash.  Gently with a scrungie or clean washcloth.  5.  Apply the CHG Soap to your body ONLY FROM THE NECK DOWN.   Do   not use on face/ open                           Wound or open sores. Avoid contact with eyes, ears mouth  and   genitals (private parts).                       Wash face,  Genitals (private parts) with your normal soap.             6.  Wash thoroughly, paying special attention to the area where your    surgery  will be performed.  7.  Thoroughly rinse your body with warm water from the neck down.  8.  DO NOT shower/wash with your normal soap after using and rinsing off the CHG Soap.                9.  Pat yourself dry with a clean towel.            10.  Wear clean pajamas.            11.  Place clean sheets on your bed the night of your first shower and do not  sleep with pets. Day of Surgery : Do not apply any lotions/deodorants the morning of surgery.  Please wear clean clothes to the hospital/surgery center.  FAILURE TO FOLLOW THESE INSTRUCTIONS MAY  RESULT IN THE CANCELLATION OF YOUR SURGERY  PATIENT SIGNATURE_________________________________  NURSE SIGNATURE__________________________________  ________________________________________________________________________     Adam Phenix  An incentive spirometer is a tool that can help keep your lungs clear and active. This tool measures how well you are filling your lungs with each breath. Taking long deep breaths may help reverse or decrease the chance of developing breathing (pulmonary) problems (especially infection) following: A long period of time when you are unable to move or be active. BEFORE THE PROCEDURE  If the spirometer includes an indicator to show your best effort, your nurse or respiratory therapist will set it to a desired goal. If possible, sit up straight or lean slightly forward. Try not to slouch. Hold the incentive spirometer in an upright position. INSTRUCTIONS FOR USE  Sit on the edge of your bed if possible, or sit up as far as you can in bed or on a chair. Hold the incentive spirometer in an upright position. Breathe out normally. Place the mouthpiece in your mouth and seal your lips tightly around it. Breathe in slowly and as deeply as possible, raising the piston or the ball toward the top of the column. Hold your breath for 3-5 seconds or for as long as possible. Allow the piston or ball to fall to the bottom of the column. Remove the mouthpiece from your mouth and breathe out normally. Rest for a few seconds and repeat Steps 1 through 7 at least 10 times every 1-2 hours when you are awake. Take your time and take a few normal breaths between deep breaths. The spirometer may include an indicator to show your best effort. Use the indicator as a goal to work toward during each repetition. After each set of 10 deep breaths, practice coughing to be sure your lungs are clear. If you have an incision (the cut made at the time of surgery), support your  incision when coughing by placing a pillow or rolled up towels firmly against it. Once you are able to get out of bed, walk around indoors and cough well. You may stop using the incentive spirometer when instructed by your caregiver.  RISKS AND COMPLICATIONS Take your time so you do not get dizzy or light-headed. If you are in pain, you may need to take or ask for pain  medication before doing incentive spirometry. It is harder to take a deep breath if you are having pain. AFTER USE Rest and breathe slowly and easily. It can be helpful to keep track of a log of your progress. Your caregiver can provide you with a simple table to help with this. If you are using the spirometer at home, follow these instructions: [place] IF:  You are having difficultly using the spirometer. You have trouble using the spirometer as often as instructed. Your pain medication is not giving enough relief while using the spirometer. You develop fever of 100.5 F (38.1 C) or higher. SEEK IMMEDIATE MEDICAL CARE IF:  You cough up bloody sputum that had not been present before. You develop fever of 102 F (38.9 C) or greater. You develop worsening pain at or near the incision site. MAKE SURE YOU:  Understand these instructions. Will watch your condition. Will get help right away if you are not doing well or get worse. Document Released: 01/17/2007 Document Revised: 11/29/2011 Document Reviewed: 03/20/2007 ExitCare Patient Information 2014 ExitCare, [place].   ________________________________________________________________________  WHAT IS A BLOOD TRANSFUSION? Blood Transfusion Information  A transfusion is the replacement of blood or some of its parts. Blood is made up of multiple cells which provide different functions. Red blood cells carry oxygen and are used for blood loss replacement. White blood cells fight against infection. Platelets control bleeding. Plasma helps clot blood. Other blood  products are available for specialized needs, such as hemophilia or other clotting disorders. BEFORE THE TRANSFUSION  Who gives blood for transfusions?  Healthy volunteers who are fully evaluated to make sure their blood is safe. This is blood bank blood. Transfusion therapy is the safest it has ever been in the practice of medicine. Before blood is taken from a donor, a complete history is taken to make sure that person has no history of diseases nor engages in risky social behavior (examples are intravenous drug use or sexual activity with multiple partners). The donor's travel history is screened to minimize risk of transmitting infections, such as malaria. The donated blood is tested for signs of infectious diseases, such as HIV and hepatitis. The blood is then tested to be sure it is compatible with you in order to minimize the chance of a transfusion reaction. If you or a relative donates blood, this is often done in anticipation of surgery and is not appropriate for emergency situations. It takes many days to process the donated blood. RISKS AND COMPLICATIONS Although transfusion therapy is very safe and saves many lives, the main dangers of transfusion include:  Getting an infectious disease. Developing a transfusion reaction. This is an allergic reaction to something in the blood you were given. Every precaution is taken to prevent this. The decision to have a blood transfusion has been considered carefully by your caregiver before blood is given. Blood is not given unless the benefits outweigh the risks. AFTER THE TRANSFUSION Right after receiving a blood transfusion, you will usually feel much better and more energetic. This is especially true if your red blood cells have gotten low (anemic). The transfusion raises the level of the red blood cells which carry oxygen, and this usually causes an energy increase. The nurse administering the transfusion will monitor you carefully for  complications. HOME CARE INSTRUCTIONS  No special instructions are needed after a transfusion. You may find your energy is better. Speak with your caregiver about any limitations on activity for underlying diseases you may have. SEEK  MEDICAL CARE IF:  Your condition is not improving after your transfusion. You develop redness or irritation at the intravenous (IV) site. SEEK IMMEDIATE MEDICAL CARE IF:  Any of the following symptoms occur over the next 12 hours: Shaking chills. You have a temperature by mouth above 102 F (38.9 C), not controlled by medicine. Chest, back, or muscle pain. People around you feel you are not acting correctly or are confused. Shortness of breath or difficulty breathing. Dizziness and fainting. You get a rash or develop hives. You have a decrease in urine output. Your urine turns a dark color or changes to pink, red, or brown. Any of the following symptoms occur over the next 10 days: You have a temperature by mouth above 102 F (38.9 C), not controlled by medicine. Shortness of breath. Weakness after normal activity. The white part of the eye turns yellow (jaundice). You have a decrease in the amount of urine or are urinating less often. Your urine turns a dark color or changes to pink, red, or brown. Document Released: 09/03/2000 Document Revised: 11/29/2011 Document Reviewed: 04/22/2008 [facility] Patient Information 2014 [place], [place].  _______________________________________________________________________

## 2022-04-05 NOTE — Progress Notes (Signed)
COVID Vaccine Completed: Yes  Date of COVID positive in last 90 days:  PCP - Cathlean Cower, MD Cardiologist - Johnny Bridge, MD (last saw 2008 to eval murmur) Pulmonologist Blossom Hoops, MD (abnormal CXR, last visit 2018)  Chest x-ray - 02-08-22 epic EKG - 03-22-22 Epic Stress Test -  ECHO - greater than 2 years Epic Cardiac Cath -  Pacemaker/ICD device last checked: Spinal Cord Stimulator:  Cardiac CT - 06-05-19 Epic  Bowel Prep -   Sleep Study -  CPAP -   Fasting Blood Sugar -  Checks Blood Sugar _____ times a day  Blood Thinner Instructions: Aspirin Instructions: Last Dose:  Activity level:  Can go up a flight of stairs and perform activities of daily living without stopping and without symptoms of chest pain or shortness of breath.  Able to exercise without symptoms  Unable to go up a flight of stairs without symptoms of     Anesthesia review:   Patient denies shortness of breath, fever, cough and chest pain at PAT appointment  Patient verbalized understanding of instructions that were given to them at the PAT appointment. Patient was also instructed that they will need to review over the PAT instructions again at home before surgery.

## 2022-04-07 ENCOUNTER — Encounter (HOSPITAL_COMMUNITY)
Admission: RE | Admit: 2022-04-07 | Discharge: 2022-04-07 | Disposition: A | Discharge status: Home / Self Care | Payer: 59 | Source: Ambulatory Visit | Attending: Urology | Admitting: Urology

## 2022-04-07 ENCOUNTER — Other Ambulatory Visit: Payer: Self-pay

## 2022-04-07 ENCOUNTER — Encounter (HOSPITAL_COMMUNITY): Payer: Self-pay

## 2022-04-07 DIAGNOSIS — Z01812 Encounter for preprocedural laboratory examination: Secondary | ICD-10-CM | POA: Insufficient documentation

## 2022-04-07 LAB — BASIC METABOLIC PANEL
Anion gap: 12 (ref 5–15)
BUN: 17 mg/dL (ref 8–23)
CO2: 23 mmol/L (ref 22–32)
Calcium: 9.2 mg/dL (ref 8.9–10.3)
Chloride: 99 mmol/L (ref 98–111)
Creatinine, Ser: 0.66 mg/dL (ref 0.44–1.00)
GFR, Estimated: >60 mL/min (ref >60)
Glucose, Bld: 112 mg/dL — ABNORMAL HIGH (ref 70–99)
Potassium: 3.3 mmol/L — ABNORMAL LOW (ref 3.5–5.1)
Sodium: 134 mmol/L — ABNORMAL LOW (ref 135–145)

## 2022-04-07 LAB — CBC
HCT: 39.3 % (ref 36.0–46.0)
Hemoglobin: 13.1 g/dL (ref 12.0–15.0)
MCH: 28.4 pg (ref 26.0–34.0)
MCHC: 33.3 g/dL (ref 30.0–36.0)
MCV: 85.1 fL (ref 80.0–100.0)
Platelets: 221 10*3/uL (ref 150–400)
RBC: 4.62 MIL/uL (ref 3.87–5.11)
RDW: 13.3 % (ref 11.5–15.5)
WBC: 6.7 10*3/uL (ref 4.0–10.5)
nRBC: 0 % (ref 0.0–0.2)

## 2022-04-07 LAB — GLUCOSE, CAPILLARY: Glucose-Capillary: 124 mg/dL — ABNORMAL HIGH (ref 70–99)

## 2022-04-07 NOTE — Patient Instructions (Addendum)
DUE TO COVID-19 ONLY TWO VISITORS  (aged 66 and older)  IS ALLOWED TO COME WITH YOU AND STAY IN THE WAITING ROOM ONLY DURING PRE OP AND PROCEDURE.   **NO VISITORS ARE ALLOWED IN THE SHORT STAY AREA OR RECOVERY ROOM!!**  IF YOU WILL BE ADMITTED INTO THE HOSPITAL YOU ARE ALLOWED ONLY FOUR SUPPORT PEOPLE DURING VISITATION HOURS ONLY (7 AM -8PM)   The support person(s) must pass our screening, gel in and out Visitors GUEST BADGE MUST BE WORN VISIBLY  One adult visitor may remain with you overnight and MUST be in the room by 8 P.M.   You are not required to [employer] often Do NOT share personal items Notify your provider if you are in close contact with someone who has COVID or you develop fever 100.4 or greater, new onset of sneezing, cough, sore throat, shortness of breath or body aches.        Your procedure is scheduled on:  04-16-22   Report to [facility] Main Entrance    Report to admitting at 10:15 AM   Call this number if you have problems the morning of surgery [phone]   Follow a clear liquid diet the day before surgery   Do not eat food :After Midnight the night before surgery   After Midnight you may have the following liquids until 6:30 AM DAY OF SURGERY  Clear Liquid Diet Water Black Coffee (sugar ok, NO MILK/CREAM OR CREAMERS)  Tea (sugar ok, NO MILK/CREAM OR CREAMERS) regular and decaf                             Plain Jell-O (NO RED)                                           Fruit ices (not with fruit pulp, NO RED)                                     Popsicles (NO RED)                                                                  Juice: apple, WHITE grape, WHITE cranberry Sports drinks like Gatorade (NO RED)          If you have questions, please contact your surgeon's office.   FOLLOW BOWEL PREP AND ANY ADDITIONAL PRE OP INSTRUCTIONS YOU RECEIVED FROM YOUR SURGEON'S OFFICE!!!     Oral Hygiene is also important to reduce your risk  of infection.                                    Remember - BRUSH YOUR TEETH THE MORNING OF SURGERY WITH YOUR REGULAR TOOTHPASTE   Do NOT smoke after Midnight   Take these medicines the morning of surgery with A SIP OF WATER:  Tylenol, Omeprazole   DO NOT [place]. PHARMACY WILL DISPENSE MEDICATIONS  LISTED ON YOUR MEDICATION LIST TO YOU DURING [place]!                         You may not have any metal on your body including hair pins, jewelry, and body piercing             Do not wear make-up, lotions, powders, perfumes or deodorant  Do not wear nail polish including gel and S&S, artificial/acrylic nails, or any other type of covering on natural nails including finger and toenails. If you have artificial nails, gel coating, etc. that needs to be removed by a nail salon please have this removed prior to surgery or surgery may need to be canceled/ delayed if the surgeon/ anesthesia feels like they are unable to be safely monitored.   Do not shave  48 hours prior to surgery.            Contacts, dentures or bridgework may not be worn into surgery.   Bring small overnight bag day of surgery.  Do not bring valuables to the hospital. [place].  Special Instructions: Bring a copy of your healthcare power of attorney and living will documents the day of surgery if you haven't scanned them before.  Please read over the following fact sheets you were given: IF YOU HAVE QUESTIONS ABOUT YOUR PRE-OP INSTRUCTIONS PLEASE CALL [facility] - Preparing for Surgery Before surgery, you can play an important role.  Because skin is not sterile, your skin needs to be as free of germs as possible.  You can reduce the number of germs on your skin by washing with CHG (chlorahexidine gluconate) soap before surgery.  CHG is an antiseptic cleaner which kills germs and bonds with the skin to continue  killing germs even after washing. Please DO NOT use if you have an allergy to CHG or antibacterial soaps.  If your skin becomes reddened/irritated stop using the CHG and inform your nurse when you arrive at Short Stay. Do not shave (including legs and underarms) for at least 48 hours prior to the first CHG shower.  You may shave your face/neck.  Please follow these instructions carefully:  1.  Shower with CHG Soap the night before surgery and the  morning of surgery.  2.  If you choose to wash your hair, wash your hair first as usual with your normal  shampoo.  3.  After you shampoo, rinse your hair and body thoroughly to remove the shampoo.                             4.  Use CHG as you would any other liquid soap.  You can apply chg directly to the skin and wash.  Gently with a scrungie or clean washcloth.  5.  Apply the CHG Soap to your body ONLY FROM THE NECK DOWN.   Do   not use on face/ open                           Wound or open sores. Avoid contact with eyes, ears mouth and   genitals (private parts).                       Wash face,  Genitals (private parts) with your normal soap.  6.  Wash thoroughly, paying special attention to the area where your    surgery  will be performed.  7.  Thoroughly rinse your body with warm water from the neck down.  8.  DO NOT shower/wash with your normal soap after using and rinsing off the CHG Soap.                9.  Pat yourself dry with a clean towel.            10.  Wear clean pajamas.            11.  Place clean sheets on your bed the night of your first shower and do not  sleep with pets. Day of Surgery : Do not apply any lotions/deodorants the morning of surgery.  Please wear clean clothes to the hospital/surgery center.  FAILURE TO FOLLOW THESE INSTRUCTIONS MAY RESULT IN THE CANCELLATION OF YOUR SURGERY  PATIENT SIGNATURE_________________________________  NURSE  SIGNATURE__________________________________  ________________________________________________________________________     [name] [name]  An incentive spirometer is a tool that can help keep your lungs clear and active. This tool measures how well you are filling your lungs with each breath. Taking long deep breaths may help reverse or decrease the chance of developing breathing (pulmonary) problems (especially infection) following: A long period of time when you are unable to move or be active. BEFORE THE PROCEDURE  If the spirometer includes an indicator to show your best effort, your nurse or respiratory therapist will set it to a desired goal. If possible, sit up straight or lean slightly forward. Try not to slouch. Hold the incentive spirometer in an upright position. INSTRUCTIONS FOR USE  Sit on the edge of your bed if possible, or sit up as far as you can in bed or on a chair. Hold the incentive spirometer in an upright position. Breathe out normally. Place the mouthpiece in your mouth and seal your lips tightly around it. Breathe in slowly and as deeply as possible, raising the piston or the ball toward the top of the column. Hold your breath for 3-5 seconds or for as long as possible. Allow the piston or ball to fall to the bottom of the column. Remove the mouthpiece from your mouth and breathe out normally. Rest for a few seconds and repeat Steps 1 through 7 at least 10 times every 1-2 hours when you are awake. Take your time and take a few normal breaths between deep breaths. The spirometer may include an indicator to show your best effort. Use the indicator as a goal to work toward during each repetition. After each set of 10 deep breaths, practice coughing to be sure your lungs are clear. If you have an incision (the cut made at the time of surgery), support your incision when coughing by placing a pillow or rolled up towels firmly against it. Once you are able to get out  of bed, walk around indoors and cough well. You may stop using the incentive spirometer when instructed by your caregiver.  RISKS AND COMPLICATIONS Take your time so you do not get dizzy or light-headed. If you are in pain, you may need to take or ask for pain medication before doing incentive spirometry. It is harder to take a deep breath if you are having pain. AFTER USE Rest and breathe slowly and easily. It can be helpful to keep track of a log of your progress. Your caregiver can provide you with a simple table to help  with this. If you are using the spirometer at home, follow these instructions: [place] IF:  You are having difficultly using the spirometer. You have trouble using the spirometer as often as instructed. Your pain medication is not giving enough relief while using the spirometer. You develop fever of 100.5 F (38.1 C) or higher. SEEK IMMEDIATE MEDICAL CARE IF:  You cough up bloody sputum that had not been present before. You develop fever of 102 F (38.9 C) or greater. You develop worsening pain at or near the incision site. MAKE SURE YOU:  Understand these instructions. Will watch your condition. Will get help right away if you are not doing well or get worse. Document Released: 01/17/2007 Document Revised: 11/29/2011 Document Reviewed: 03/20/2007 ExitCare Patient Information 2014 Exit[employer], [place].   ________________________________________________________________________  WHAT IS A BLOOD TRANSFUSION? Blood Transfusion Information  A transfusion is the replacement of blood or some of its parts. Blood is made up of multiple cells which provide different functions. Red blood cells carry oxygen and are used for blood loss replacement. White blood cells fight against infection. Platelets control bleeding. Plasma helps clot blood. Other blood products are available for specialized needs, such as hemophilia or other clotting disorders. BEFORE THE TRANSFUSION   Who gives blood for transfusions?  Healthy volunteers who are fully evaluated to make sure their blood is safe. This is blood bank blood. Transfusion therapy is the safest it has ever been in the practice of medicine. Before blood is taken from a donor, a complete history is taken to make sure that person has no history of diseases nor engages in risky social behavior (examples are intravenous drug use or sexual activity with multiple partners). The donor's travel history is screened to minimize risk of transmitting infections, such as malaria. The donated blood is tested for signs of infectious diseases, such as HIV and hepatitis. The blood is then tested to be sure it is compatible with you in order to minimize the chance of a transfusion reaction. If you or a relative donates blood, this is often done in anticipation of surgery and is not appropriate for emergency situations. It takes many days to process the donated blood. RISKS AND COMPLICATIONS Although transfusion therapy is very safe and saves many lives, the main dangers of transfusion include:  Getting an infectious disease. Developing a transfusion reaction. This is an allergic reaction to something in the blood you were given. Every precaution is taken to prevent this. The decision to have a blood transfusion has been considered carefully by your caregiver before blood is given. Blood is not given unless the benefits outweigh the risks. AFTER THE TRANSFUSION Right after receiving a blood transfusion, you will usually feel much better and more energetic. This is especially true if your red blood cells have gotten low (anemic). The transfusion raises the level of the red blood cells which carry oxygen, and this usually causes an energy increase. The nurse administering the transfusion will monitor you carefully for complications. HOME CARE INSTRUCTIONS  No special instructions are needed after a transfusion. You may find your energy is  better. Speak with your caregiver about any limitations on activity for underlying diseases you may have. SEEK MEDICAL CARE IF:  Your condition is not improving after your transfusion. You develop redness or irritation at the intravenous (IV) site. SEEK IMMEDIATE MEDICAL CARE IF:  Any of the following symptoms occur over the next 12 hours: Shaking chills. You have a temperature by mouth above 102 F (  38.9 C), not controlled by medicine. Chest, back, or muscle pain. People around you feel you are not acting correctly or are confused. Shortness of breath or difficulty breathing. Dizziness and fainting. You get a rash or develop hives. You have a decrease in urine output. Your urine turns a dark color or changes to pink, red, or brown. Any of the following symptoms occur over the next 10 days: You have a temperature by mouth above 102 F (38.9 C), not controlled by medicine. Shortness of breath. Weakness after normal activity. The white part of the eye turns yellow (jaundice). You have a decrease in the amount of urine or are urinating less often. Your urine turns a dark color or changes to pink, red, or brown. Document Released: 09/03/2000 Document Revised: 11/29/2011 Document Reviewed: 04/22/2008 [facility] Patient Information 2014 [place], [place].  _______________________________________________________________________

## 2022-04-08 ENCOUNTER — Other Ambulatory Visit: Payer: Self-pay | Admitting: Internal Medicine

## 2022-04-08 NOTE — Telephone Encounter (Signed)
Please refill as per office routine med refill policy (all routine meds to be refilled for 3 mo or monthly (per pt preference) up to one year from last visit, then month to month grace period for 3 mo, then further med refills will have to be denied) ? ?

## 2022-04-15 ENCOUNTER — Telehealth: Payer: Self-pay | Admitting: Internal Medicine

## 2022-04-15 MED ORDER — ONETOUCH ULTRASOFT LANCETS MISC: 3 refills | Status: DC

## 2022-04-15 NOTE — Telephone Encounter (Signed)
Caller & Relationship to patient: [name] [name]  Call back number: [phone]  Date of last office visit: 02/18/22  Date of next office visit: 08/20/22  Medication(s) to be refilled:  Lancets [name] ULTRASOFT) lancets   Preferred Pharmacy:  [facility] ([place], [place] Phone:  [phone]  Fax:  [phone]

## 2022-04-15 NOTE — Telephone Encounter (Signed)
Refill sent to pharmacy and patient notified.

## 2022-04-16 ENCOUNTER — Encounter (HOSPITAL_COMMUNITY)
Admission: RE | Disposition: A | Discharge status: Home / Self Care | Payer: Self-pay | Source: Home / Self Care | Attending: Urology

## 2022-04-16 ENCOUNTER — Other Ambulatory Visit: Payer: Self-pay

## 2022-04-16 ENCOUNTER — Ambulatory Visit (HOSPITAL_COMMUNITY): Payer: 59 | Admitting: Certified Registered Nurse Anesthetist

## 2022-04-16 ENCOUNTER — Observation Stay (HOSPITAL_COMMUNITY)
Admission: RE | Admit: 2022-04-16 | Discharge: 2022-04-17 | Disposition: A | Discharge status: Home / Self Care | Payer: 59 | Attending: Urology | Admitting: Urology

## 2022-04-16 ENCOUNTER — Ambulatory Visit (HOSPITAL_BASED_OUTPATIENT_CLINIC_OR_DEPARTMENT_OTHER): Payer: 59 | Admitting: Certified Registered Nurse Anesthetist

## 2022-04-16 ENCOUNTER — Encounter (HOSPITAL_COMMUNITY): Payer: Self-pay | Admitting: Urology

## 2022-04-16 DIAGNOSIS — Z7722 Contact with and (suspected) exposure to environmental tobacco smoke (acute) (chronic): Secondary | ICD-10-CM | POA: Insufficient documentation

## 2022-04-16 DIAGNOSIS — C642 Malignant neoplasm of left kidney, except renal pelvis: Secondary | ICD-10-CM | POA: Diagnosis not present

## 2022-04-16 DIAGNOSIS — I10 Essential (primary) hypertension: Secondary | ICD-10-CM | POA: Diagnosis not present

## 2022-04-16 DIAGNOSIS — N3020 Other chronic cystitis without hematuria: Secondary | ICD-10-CM | POA: Diagnosis not present

## 2022-04-16 DIAGNOSIS — E119 Type 2 diabetes mellitus without complications: Secondary | ICD-10-CM | POA: Diagnosis not present

## 2022-04-16 DIAGNOSIS — Z79899 Other long term (current) drug therapy: Secondary | ICD-10-CM | POA: Insufficient documentation

## 2022-04-16 DIAGNOSIS — N2889 Other specified disorders of kidney and ureter: Secondary | ICD-10-CM | POA: Diagnosis not present

## 2022-04-16 HISTORY — PX: ROBOTIC ASSITED PARTIAL NEPHRECTOMY: SHX6087

## 2022-04-16 LAB — GLUCOSE, CAPILLARY
Glucose-Capillary: 130 mg/dL — ABNORMAL HIGH (ref 70–99)
Glucose-Capillary: 165 mg/dL — ABNORMAL HIGH (ref 70–99)
Glucose-Capillary: 180 mg/dL — ABNORMAL HIGH (ref 70–99)

## 2022-04-16 LAB — TYPE AND SCREEN
ABO/RH(D): A POS
Antibody Screen: NEGATIVE

## 2022-04-16 LAB — ABO/RH: ABO/RH(D): A POS

## 2022-04-16 LAB — HEMOGLOBIN AND HEMATOCRIT, BLOOD
HCT: 39.5 % (ref 36.0–46.0)
Hemoglobin: 13 g/dL (ref 12.0–15.0)

## 2022-04-16 SURGERY — NEPHRECTOMY, PARTIAL, ROBOT-ASSISTED
Anesthesia: General | Laterality: Left

## 2022-04-16 MED ORDER — AMISULPRIDE (ANTIEMETIC) 5 MG/2ML IV SOLN
INTRAVENOUS | Status: AC
  Filled 2022-04-16: qty 4

## 2022-04-16 MED ORDER — LIDOCAINE 2% (20 MG/ML) 5 ML SYRINGE
INTRAMUSCULAR | Status: DC | PRN
  Administered 2022-04-16: 60 mg via INTRAVENOUS

## 2022-04-16 MED ORDER — SODIUM CHLORIDE (PF) 0.9 % IJ SOLN
INTRAMUSCULAR | Status: AC
  Filled 2022-04-16: qty 20

## 2022-04-16 MED ORDER — HYDROCODONE-ACETAMINOPHEN 5-325 MG PO TABS
1.0000 | ORAL_TABLET | Freq: Four times a day (QID) | ORAL | 0 refills | Status: DC | PRN
Start: 2022-04-16 — End: 2022-06-03

## 2022-04-16 MED ORDER — DEXAMETHASONE SODIUM PHOSPHATE 4 MG/ML IJ SOLN
INTRAMUSCULAR | Status: DC | PRN
  Administered 2022-04-16: 5 mg via INTRAVENOUS

## 2022-04-16 MED ORDER — CHLORHEXIDINE GLUCONATE 0.12 % MT SOLN
15.0000 mL | Freq: Once | OROMUCOSAL | Status: AC
  Administered 2022-04-16: 15 mL via OROMUCOSAL

## 2022-04-16 MED ORDER — DIPHENHYDRAMINE HCL 12.5 MG/5ML PO ELIX: 12.5000 mg | ORAL_SOLUTION | Freq: Four times a day (QID) | ORAL | Status: DC | PRN

## 2022-04-16 MED ORDER — HYOSCYAMINE SULFATE 0.125 MG SL SUBL: 0.1250 mg | SUBLINGUAL_TABLET | SUBLINGUAL | Status: DC | PRN

## 2022-04-16 MED ORDER — ONDANSETRON HCL 4 MG/2ML IJ SOLN: 4.0000 mg | Freq: Once | INTRAMUSCULAR | Status: DC | PRN

## 2022-04-16 MED ORDER — SUGAMMADEX SODIUM 200 MG/2ML IV SOLN
INTRAVENOUS | Status: DC | PRN
  Administered 2022-04-16: 200 mg via INTRAVENOUS

## 2022-04-16 MED ORDER — LACTATED RINGERS IV SOLN: INTRAVENOUS | Status: DC

## 2022-04-16 MED ORDER — ONDANSETRON HCL 4 MG/2ML IJ SOLN
INTRAMUSCULAR | Status: DC | PRN
  Administered 2022-04-16: 4 mg via INTRAVENOUS

## 2022-04-16 MED ORDER — OXYCODONE HCL 5 MG PO TABS
5.0000 mg | ORAL_TABLET | ORAL | Status: DC | PRN
  Administered 2022-04-17: 5 mg via ORAL
  Filled 2022-04-16: qty 1

## 2022-04-16 MED ORDER — ORAL CARE MOUTH RINSE: 15.0000 mL | Freq: Once | OROMUCOSAL | Status: AC

## 2022-04-16 MED ORDER — DOCUSATE SODIUM 100 MG PO CAPS: 100.0000 mg | ORAL_CAPSULE | Freq: Two times a day (BID) | ORAL | Status: DC

## 2022-04-16 MED ORDER — PRAVASTATIN SODIUM 40 MG PO TABS
40.0000 mg | ORAL_TABLET | Freq: Every day | ORAL | Status: DC
  Administered 2022-04-16: 40 mg via ORAL
  Filled 2022-04-16: qty 1

## 2022-04-16 MED ORDER — HYDROMORPHONE HCL 1 MG/ML IJ SOLN
INTRAMUSCULAR | Status: AC
  Filled 2022-04-16: qty 2

## 2022-04-16 MED ORDER — OXYCODONE HCL 5 MG PO TABS: 5.0000 mg | ORAL_TABLET | Freq: Once | ORAL | Status: DC | PRN

## 2022-04-16 MED ORDER — FENTANYL CITRATE (PF) 250 MCG/5ML IJ SOLN
INTRAMUSCULAR | Status: AC
  Filled 2022-04-16: qty 5

## 2022-04-16 MED ORDER — FENTANYL CITRATE (PF) 100 MCG/2ML IJ SOLN
INTRAMUSCULAR | Status: AC
  Filled 2022-04-16: qty 2

## 2022-04-16 MED ORDER — ONDANSETRON HCL 4 MG/2ML IJ SOLN: 4.0000 mg | INTRAMUSCULAR | Status: DC | PRN

## 2022-04-16 MED ORDER — STERILE WATER FOR IRRIGATION IR SOLN
Status: DC | PRN
  Administered 2022-04-16: 1000 mL

## 2022-04-16 MED ORDER — PROPOFOL 10 MG/ML IV BOLUS
INTRAVENOUS | Status: AC
  Filled 2022-04-16: qty 20

## 2022-04-16 MED ORDER — HYDROMORPHONE HCL 1 MG/ML IJ SOLN
0.5000 mg | INTRAMUSCULAR | Status: DC | PRN
  Administered 2022-04-16: 1 mg via INTRAVENOUS
  Administered 2022-04-17: 0.5 mg via INTRAVENOUS
  Filled 2022-04-16 (×2): qty 1

## 2022-04-16 MED ORDER — LIDOCAINE HCL (PF) 2 % IJ SOLN
INTRAMUSCULAR | Status: AC
  Filled 2022-04-16: qty 5

## 2022-04-16 MED ORDER — ACETAMINOPHEN 10 MG/ML IV SOLN
INTRAVENOUS | Status: AC
  Administered 2022-04-16: 1000 mg
  Filled 2022-04-16: qty 100

## 2022-04-16 MED ORDER — DIPHENHYDRAMINE HCL 50 MG/ML IJ SOLN: 12.5000 mg | Freq: Four times a day (QID) | INTRAMUSCULAR | Status: DC | PRN

## 2022-04-16 MED ORDER — CEFAZOLIN SODIUM-DEXTROSE 2-4 GM/100ML-% IV SOLN
2.0000 g | Freq: Once | INTRAVENOUS | Status: AC
  Administered 2022-04-16: 2 g via INTRAVENOUS
  Filled 2022-04-16: qty 100

## 2022-04-16 MED ORDER — ACETAMINOPHEN 500 MG PO TABS
1000.0000 mg | ORAL_TABLET | Freq: Four times a day (QID) | ORAL | Status: AC
  Administered 2022-04-16 – 2022-04-17 (×4): 1000 mg via ORAL
  Filled 2022-04-16 (×5): qty 2

## 2022-04-16 MED ORDER — INSULIN ASPART 100 UNIT/ML IJ SOLN: 0.0000 [IU] | Freq: Three times a day (TID) | INTRAMUSCULAR | Status: DC

## 2022-04-16 MED ORDER — HYDROMORPHONE HCL 1 MG/ML IJ SOLN
0.2500 mg | INTRAMUSCULAR | Status: DC | PRN
  Administered 2022-04-16 (×2): 0.5 mg via INTRAVENOUS

## 2022-04-16 MED ORDER — ACETAMINOPHEN 500 MG PO TABS
1000.0000 mg | ORAL_TABLET | Freq: Once | ORAL | Status: DC
  Filled 2022-04-16: qty 2

## 2022-04-16 MED ORDER — BUPIVACAINE LIPOSOME 1.3 % IJ SUSP
INTRAMUSCULAR | Status: DC | PRN
  Administered 2022-04-16: 20 mL

## 2022-04-16 MED ORDER — OXYCODONE HCL 5 MG/5ML PO SOLN: 5.0000 mg | Freq: Once | ORAL | Status: DC | PRN

## 2022-04-16 MED ORDER — LACTATED RINGERS IR SOLN
Status: DC | PRN
  Administered 2022-04-16: 1000 mL

## 2022-04-16 MED ORDER — "VISTASEAL 4 ML SINGLE DOSE KIT "
4.0000 mL | PACK | Freq: Once | CUTANEOUS | Status: DC
  Filled 2022-04-16: qty 4

## 2022-04-16 MED ORDER — BUPIVACAINE LIPOSOME 1.3 % IJ SUSP
INTRAMUSCULAR | Status: AC
  Filled 2022-04-16: qty 20

## 2022-04-16 MED ORDER — MIDAZOLAM HCL 2 MG/2ML IJ SOLN
INTRAMUSCULAR | Status: AC
  Filled 2022-04-16: qty 2

## 2022-04-16 MED ORDER — ACETAMINOPHEN 10 MG/ML IV SOLN: 1000.0000 mg | Freq: Once | INTRAVENOUS | Status: DC

## 2022-04-16 MED ORDER — ROCURONIUM BROMIDE 10 MG/ML (PF) SYRINGE
PREFILLED_SYRINGE | INTRAVENOUS | Status: AC
Start: 2022-04-16 — End: ?
  Filled 2022-04-16: qty 10

## 2022-04-16 MED ORDER — PROMETHAZINE HCL 12.5 MG PO TABS: 12.5000 mg | ORAL_TABLET | ORAL | 0 refills | Status: DC | PRN

## 2022-04-16 MED ORDER — ROCURONIUM BROMIDE 10 MG/ML (PF) SYRINGE
PREFILLED_SYRINGE | INTRAVENOUS | Status: DC | PRN
  Administered 2022-04-16: 100 mg via INTRAVENOUS

## 2022-04-16 MED ORDER — FENTANYL CITRATE (PF) 100 MCG/2ML IJ SOLN
INTRAMUSCULAR | Status: DC | PRN
  Administered 2022-04-16 (×3): 50 ug via INTRAVENOUS
  Administered 2022-04-16: 100 ug via INTRAVENOUS
  Administered 2022-04-16 (×2): 50 ug via INTRAVENOUS

## 2022-04-16 MED ORDER — PANTOPRAZOLE SODIUM 40 MG PO TBEC
40.0000 mg | DELAYED_RELEASE_TABLET | Freq: Every day | ORAL | Status: DC
  Administered 2022-04-17: 40 mg via ORAL
  Filled 2022-04-16: qty 1

## 2022-04-16 MED ORDER — PROPOFOL 10 MG/ML IV BOLUS
INTRAVENOUS | Status: DC | PRN
  Administered 2022-04-16: 150 mg via INTRAVENOUS

## 2022-04-16 MED ORDER — HEMOSTATIC AGENTS (NO CHARGE) OPTIME
TOPICAL | Status: DC | PRN
  Administered 2022-04-16: 1 via TOPICAL

## 2022-04-16 MED ORDER — MIDAZOLAM HCL 5 MG/5ML IJ SOLN
INTRAMUSCULAR | Status: DC | PRN
  Administered 2022-04-16: 2 mg via INTRAVENOUS

## 2022-04-16 MED ORDER — SODIUM CHLORIDE (PF) 0.9 % IJ SOLN
INTRAMUSCULAR | Status: DC | PRN
  Administered 2022-04-16: 20 mL

## 2022-04-16 MED ORDER — SODIUM CHLORIDE 0.45 % IV SOLN: INTRAVENOUS | Status: DC

## 2022-04-16 MED ORDER — DOCUSATE SODIUM 100 MG PO CAPS
100.0000 mg | ORAL_CAPSULE | Freq: Two times a day (BID) | ORAL | Status: DC
  Administered 2022-04-16 – 2022-04-17 (×2): 100 mg via ORAL
  Filled 2022-04-16 (×2): qty 1

## 2022-04-16 MED ORDER — AMISULPRIDE (ANTIEMETIC) 5 MG/2ML IV SOLN
10.0000 mg | Freq: Once | INTRAVENOUS | Status: AC | PRN
Start: 2022-04-16 — End: 2022-04-16
  Administered 2022-04-16: 10 mg via INTRAVENOUS

## 2022-04-16 MED ORDER — "VISTASEAL 4 ML SINGLE DOSE KIT "
PACK | CUTANEOUS | Status: DC | PRN
  Administered 2022-04-16: 4 mL via TOPICAL

## 2022-04-16 SURGICAL SUPPLY — 66 items
ADH SKN CLS APL DERMABOND .7 (GAUZE/BANDAGES/DRESSINGS) ×1
APL LAPSCP 35 DL APL RGD (MISCELLANEOUS) ×1
APL PRP STRL LF DISP 70% ISPRP (MISCELLANEOUS) ×1
APL SRG 32X5 SNPLK LF DISP (MISCELLANEOUS)
APPLICATOR VISTASEAL 35 (MISCELLANEOUS) ×3 IMPLANT
CHLORAPREP W/TINT 26 (MISCELLANEOUS) ×3 IMPLANT
CLIP LIGATING HEM O LOK PURPLE (MISCELLANEOUS) ×3 IMPLANT
CLIP LIGATING HEMO O LOK GREEN (MISCELLANEOUS) ×6 IMPLANT
CLIP SUT LAPRA TY ABSORB (SUTURE) ×7 IMPLANT
COVER SURGICAL LIGHT HANDLE (MISCELLANEOUS) ×3 IMPLANT
COVER TIP SHEARS 8 DVNC (MISCELLANEOUS) ×2 IMPLANT
COVER TIP SHEARS 8MM DA VINCI (MISCELLANEOUS) ×2
CUTTER ECHEON FLEX ENDO 45 340 (ENDOMECHANICALS) IMPLANT
DERMABOND ADVANCED (GAUZE/BANDAGES/DRESSINGS) ×1
DERMABOND ADVANCED .7 DNX12 (GAUZE/BANDAGES/DRESSINGS) ×2 IMPLANT
DRAIN CHANNEL 15F RND FF 3/16 (WOUND CARE) IMPLANT
DRAPE ARM DVNC X/XI (DISPOSABLE) ×8 IMPLANT
DRAPE COLUMN DVNC XI (DISPOSABLE) ×2 IMPLANT
DRAPE DA VINCI XI ARM (DISPOSABLE) ×8
DRAPE DA VINCI XI COLUMN (DISPOSABLE) ×2
DRAPE INCISE IOBAN 66X45 STRL (DRAPES) ×3 IMPLANT
DRAPE SHEET LG 3/4 BI-LAMINATE (DRAPES) ×3 IMPLANT
ELECT PENCIL ROCKER SW 15FT (MISCELLANEOUS) ×3 IMPLANT
ELECT REM PT RETURN 15FT ADLT (MISCELLANEOUS) ×3 IMPLANT
EVACUATOR SILICONE 100CC (DRAIN) IMPLANT
GLOVE BIO SURGEON STRL SZ 6.5 (GLOVE) ×3 IMPLANT
GLOVE BIOGEL PI IND STRL 8 (GLOVE) ×4 IMPLANT
GLOVE BIOGEL PI INDICATOR 8 (GLOVE) ×2
GLOVE SURG LX 7.5 STRW (GLOVE) ×2
GLOVE SURG LX STRL 7.5 STRW (GLOVE) ×4 IMPLANT
GOWN STRL REUS W/ TWL LRG LVL3 (GOWN DISPOSABLE) ×2 IMPLANT
GOWN STRL REUS W/ TWL XL LVL3 (GOWN DISPOSABLE) ×4 IMPLANT
GOWN STRL REUS W/TWL LRG LVL3 (GOWN DISPOSABLE) ×2
GOWN STRL REUS W/TWL XL LVL3 (GOWN DISPOSABLE) ×4
HEMOSTAT SURGICEL 2X3 (HEMOSTASIS) ×1 IMPLANT
HEMOSTAT SURGICEL 4X8 (HEMOSTASIS) ×3 IMPLANT
HOLDER FOLEY CATH W/STRAP (MISCELLANEOUS) ×3 IMPLANT
IRRIG SUCT STRYKERFLOW 2 WTIP (MISCELLANEOUS) ×2
IRRIGATION SUCT STRKRFLW 2 WTP (MISCELLANEOUS) ×2 IMPLANT
KIT BASIN OR (CUSTOM PROCEDURE TRAY) ×3 IMPLANT
KIT TURNOVER KIT A (KITS) IMPLANT
MARKER SKIN DUAL TIP RULER LAB (MISCELLANEOUS) ×3 IMPLANT
NDL INSUFFLATION 14GA 120MM (NEEDLE) ×2 IMPLANT
NEEDLE INSUFFLATION 14GA 120MM (NEEDLE) ×2 IMPLANT
PROTECTOR NERVE ULNAR (MISCELLANEOUS) ×6 IMPLANT
SCISSORS LAP 5X45 EPIX DISP (ENDOMECHANICALS) ×3 IMPLANT
SEAL CANN UNIV 5-8 DVNC XI (MISCELLANEOUS) ×8 IMPLANT
SEAL XI 5MM-8MM UNIVERSAL (MISCELLANEOUS) ×8
SEALANT SURGICAL APPL DUAL CAN (MISCELLANEOUS) IMPLANT
SET TUBE SMOKE EVAC HIGH FLOW (TUBING) ×3 IMPLANT
SOLUTION ELECTROLUBE (MISCELLANEOUS) ×3 IMPLANT
SPIKE FLUID TRANSFER (MISCELLANEOUS) ×3 IMPLANT
SUT MNCRL AB 4-0 PS2 18 (SUTURE) ×6 IMPLANT
SUT VIC AB 1 CT1 36 (SUTURE) ×12 IMPLANT
SUT VLOC BARB 180 ABS3/0GR12 (SUTURE) ×2
SUTURE VLOC BRB 180 ABS3/0GR12 (SUTURE) ×4 IMPLANT
SYS BAG RETRIEVAL 10MM (BASKET) ×2
SYSTEM BAG RETRIEVAL 10MM (BASKET) ×2 IMPLANT
TOWEL OR 17X26 10 PK STRL BLUE (TOWEL DISPOSABLE) ×3 IMPLANT
TOWEL OR NON WOVEN STRL DISP B (DISPOSABLE) ×3 IMPLANT
TRAY FOLEY MTR SLVR 16FR STAT (SET/KITS/TRAYS/PACK) ×3 IMPLANT
TRAY LAPAROSCOPIC (CUSTOM PROCEDURE TRAY) ×3 IMPLANT
TROCAR UNIVERSAL OPT 12M 100M (ENDOMECHANICALS) IMPLANT
TROCAR Z THREAD OPTICAL 12X100 (TROCAR) ×3 IMPLANT
TROCAR Z-THREAD OPTICAL 5X100M (TROCAR) IMPLANT
WATER STERILE IRR 1000ML POUR (IV SOLUTION) ×3 IMPLANT

## 2022-04-16 NOTE — Anesthesia Preprocedure Evaluation (Addendum)
Anesthesia Evaluation  Patient identified by MRN, date of birth, ID band Patient awake    Reviewed: Allergy & Precautions, NPO status , Patient's Chart, lab work & pertinent test results  History of Anesthesia Complications Negative for: history of anesthetic complications  Airway Mallampati: II  TM Distance: >3 FB Neck ROM: Full    Dental no notable dental hx. (+) Teeth Intact, Dental Advisory Given   Pulmonary neg pulmonary ROS,    Pulmonary exam normal breath sounds clear to auscultation       Cardiovascular hypertension (157/83 in preop, normally 120 SBP per pt), Pt. on medications Normal cardiovascular exam Rhythm:Regular Rate:Normal     Neuro/Psych  Headaches, PSYCHIATRIC DISORDERS Anxiety    GI/Hepatic Neg liver ROS, GERD  Medicated and Controlled,  Endo/Other  diabetes, Well Controlled, Type 2  Renal/GU Renal diseaseRenal mass  negative genitourinary   Musculoskeletal  (+) Arthritis , Osteoarthritis,    Abdominal   Peds  Hematology negative hematology ROS (+) Hb 13.1   Anesthesia Other Findings   Reproductive/Obstetrics negative OB ROS                            Anesthesia Physical Anesthesia Plan  ASA: 2  Anesthesia Plan: General   Post-op Pain Management: Tylenol PO (pre-op)*   Induction: Intravenous  PONV Risk Score and Plan: 4 or greater and Ondansetron, Dexamethasone, Midazolam and Treatment may vary due to age or medical condition  Airway Management Planned: Oral ETT  Additional Equipment: None  Intra-op Plan:   Post-operative Plan: Extubation in OR  Informed Consent: I have reviewed the patients History and Physical, chart, labs and discussed the procedure including the risks, benefits and alternatives for the proposed anesthesia with the patient or authorized representative who has indicated his/her understanding and acceptance.     Dental advisory  given  Plan Discussed with: CRNA  Anesthesia Plan Comments:        Anesthesia Quick Evaluation

## 2022-04-16 NOTE — Plan of Care (Signed)
  Problem: Education: Goal: Ability to describe self-care measures that may prevent or decrease complications (Diabetes Survival Skills Education) will improve Outcome: Progressing   Problem: Coping: Goal: Ability to adjust to condition or change in health will improve Outcome: Progressing   Problem: Pain Managment: Goal: General experience of comfort will improve Outcome: Progressing   

## 2022-04-16 NOTE — Op Note (Signed)
Operative Note  Preoperative diagnosis:  1.  1.1 cm left renal mass  Postoperative diagnosis: 1.  1.1 cm left renal mass  Procedure(s): 1.  Robot-assisted laparoscopic left partial nephrectomy 2.  Intraoperative ultrasound of single retroperitoneal organ  Surgeon: Ellison Hughs, MD  Assistants: Debbrah Alar, PA-C  An assistant was required for this surgical procedure.  The duties of the assistant included but were not limited to suctioning, passing suture, camera manipulation, retraction.  This procedure would not be able to be performed without an Environmental consultant.   Anesthesia:  General  Complications:  None  EBL: 50 mL  Specimens: 1.  Left renal mass  Drains/Catheters: 1.  Foley catheter  Intraoperative findings:   Grossly negative margins following excision of 1.1 cm left renal mass Left renorrhaphy was hemostatic at the conclusion of the case  Indication:  [name] [name] is a 66 y.o. female with a solid and enhancing 1.1 cm left renal mass with features concerning for renal cell carcinoma  Description of procedure:  After informed consent was obtained, the patient was brought to the operating room and general endotracheal anesthesia was administered.  A 16 French Foley catheter was then sterilely placed and set to gravity drainage.  The patient was then placed in the right lateral decubitus position and prepped and draped in usual sterile fashion.  A timeout was performed.  An 8 mm incision was then made lateral to the left rectus muscle at the level of the left 12th rib.  Abdominal access was obtained via a Veress needle.  The abdominal cavity was then insufflated up to 15 mmHg.  An 8 mm port was then introduced into the abdominal cavity.  Inspection of the port entry site by the robotic camera revealed no adjacent organ injury.  We then placed 3 additional 8 mm robotic ports to triangulate the left renal hilum.  A 12 mm assistant port was then placed between the carmera  port and 3rd robotic arm.  The white line of [place] along the descending colon was incised sharply and the colon, along with its mesocolonic fat, was reflected medially until the aorta was identified.  We then made a small window adjacent to the lower pole of the left kidney, identifying the left psoas muscle, left ureter and left gonadal vein.  The left ureter and gonadal vein were then reflected anteriorly allowing [place] to then incised the perihilar attachments using electrocautery.  We encountered a small lumbar vein adjacent to the insertion of the left gonadal vein into the left renal vein.  This lumbar vein was ligated with hemo-lock clips in 2 places and incised sharply.  This provided [place] excellent exposure to the left renal hilum.  The perilymphatic tissue surrounding the left renal artery was carefully dissected away, creating a window to place a bulldog clamp later in the procedure.  The anterior portion of Gerota's fascia was incised, allowing reflection the perinephric fat medially and laterally until there was adequate exposure of the anterior/medial left renal mass.  Intraoperative ultrasound confirmed the heterogenous echogenicity of the lesion compared to the remainder of the renal parenchyma and allowed identification of the depth/borders of the mass, which were demarcated using electrocautery along the renal capsule.  We then exposed the left renal artery and placed a bulldog clamp, marking warm ischemia time.  The left kidney immediately became ischemic and pale in appearance.  The left renal mass was then sharply excised with minimal blood loss.  After the mass was free, it was  placed in the left upper quadrant to be retrieved later on during the operation.    The renorrhaphy was then performed using a 3-0 V-lock in the deep layer of the renal parenchyma.  The bulldog clamp was then removed marking warm ischemia time at 10 minutes.   A series of 1-0 Vicryl sutures with Hem-o-lok clips acting  as a buttress were then used to reapproximate the renal capsule.  There did not appear to be any obvious bleeding around the renal hilum nor surrounding our repair.  The incised Gerota's fascia overlying the mass was then reapproximated using a running 2-0 V lock suture.  The mass was then placed in an Endo Catch bag.  Surgicel and Vistaseal were then applied to the resection bed.  The robot was then de-docked and the camera was then reinserted into the assistant port. Laparoscopic graspers were then used to grab the string of the Endo Catch bag, which was brought out through the 12 mm assistant port.  The abdomen was then desufflated and all ports were removed.  The assistant port incision was then extended approximately 1-2 cm and the left renal mass, within the Endo Catch bag, was removed and sent to pathology for permanent section.  The fascia within the assistant port incision was then reapproximated using a 0 Vicryl suture.  The remainder of the incisions were then closed using 4-0 Monocryl and dressed appropriately.  Patient tolerated the procedure well and was transferred to the postanesthesia unit in stable condition.  Plan: Monitor on the floor overnight

## 2022-04-16 NOTE — Transfer of Care (Signed)
Immediate Anesthesia Transfer of Care Note  Patient: [name] [name]  Procedure(s) Performed: XI ROBOTIC ASSITED PARTIAL NEPHRECTOMY (Left)  Patient Location: PACU  Anesthesia Type:General  Level of Consciousness: drowsy and patient cooperative  Airway & Oxygen Therapy: Patient Spontanous Breathing and Patient connected to face mask oxygen  Post-op Assessment: Report given to RN and Post -op Vital signs reviewed and stable  Post vital signs: Reviewed and stable  Last Vitals:  Vitals Value Taken Time  BP 227/106 04/16/22 1632  Temp    Pulse 91 04/16/22 1634  Resp 16 04/16/22 1634  SpO2 100 % 04/16/22 1634  Vitals shown include unvalidated device data.  Last Pain:  Vitals:   04/16/22 1058  TempSrc:   PainSc: 0-No pain         Complications: No notable events documented.

## 2022-04-16 NOTE — Anesthesia Procedure Notes (Signed)
Procedure Name: Intubation Date/Time: 04/16/2022 2:10 PM  Performed by: Claudia Desanctis, CRNAPre-anesthesia Checklist: Patient identified, Emergency Drugs available, Suction available and Patient being monitored Patient Re-evaluated:Patient Re-evaluated prior to induction Oxygen Delivery Method: Circle system utilized Preoxygenation: Pre-oxygenation with 100% oxygen Induction Type: IV induction Ventilation: Mask ventilation without difficulty Laryngoscope Size: Miller and 3 Grade View: Grade I Tube type: Oral Tube size: 7.0 mm Number of attempts: 1 Airway Equipment and Method: Stylet Placement Confirmation: ETT inserted through vocal cords under direct vision, positive ETCO2 and breath sounds checked- equal and bilateral Secured at: 21 cm Tube secured with: Tape Dental Injury: Teeth and Oropharynx as per pre-operative assessment

## 2022-04-16 NOTE — Discharge Instructions (Signed)

## 2022-04-16 NOTE — Anesthesia Postprocedure Evaluation (Signed)
Anesthesia Post Note  Patient: [name] [name]  Procedure(s) Performed: XI ROBOTIC ASSITED PARTIAL NEPHRECTOMY (Left)     Patient location during evaluation: PACU Anesthesia Type: General Level of consciousness: awake and alert, oriented and patient cooperative Pain management: pain level controlled Vital Signs Assessment: post-procedure vital signs reviewed and stable Respiratory status: spontaneous breathing, nonlabored ventilation and respiratory function stable Cardiovascular status: blood pressure returned to baseline and stable Postop Assessment: no apparent nausea or vomiting Anesthetic complications: no Comments: PONV requiring barhemysis    No notable events documented.  Last Vitals:  Vitals:   04/16/22 1645 04/16/22 1700  BP: (!) 171/86 (!) 168/87  Pulse: 88 81  Resp: (!) 9 11  Temp:    SpO2: 100% 100%    Last Pain:  Vitals:   04/16/22 1058  TempSrc:   PainSc: 0-No pain                 Pervis Hocking

## 2022-04-16 NOTE — H&P (Signed)
PRE-OP H&P  Office Visit Report     04/06/2022   --------------------------------------------------------------------------------   [name] [name]  MRN: [id number]  DOB: 12/26/1955, 66 year old Female  PRIMARY CARE:  Cathlean Cower, MD  REFERRING:  Scott A. MacDiarmid, MD  PROVIDER:  Bjorn Loser, M.D.  TREATING:  Daine Gravel, NP  LOCATION:  [facility] [phone]     --------------------------------------------------------------------------------   CC/HPI: Left renal mass   [name] [name] is a 66 year old female referred by Dr. Matilde Sprang after she was found to have a 1.1 cm solid and enhancing left renal lesion with features concerning for renal cell carcinoma. The lesion was identified on CT from 01/28/2022 during evaluation for hematuria. No other concerning masses or lesions were identified within the abdomen or pelvis. She had a cystoscopy on 01/13/2022 that revealed no intravesical lesions.   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 She denies any changes to her health history. She had preoperative labs drawn at [facility] long which showed an elevated glucose of 139. She has a PCP who monitors her blood pressure. She reports this has been well controlled.     ALLERGIES: Lipitor TABS    MEDICATIONS: Ceylon Cinnamon  Coq10  Levocetirizine Dihydrochloride 5 mg tablet 1 tablet PO Daily  Lovastatin 20 mg tablet  Magnesium  Mucinex  Potassium Chloride 20 meq tablet, ext release, particles/crystals 1 tablet PO Daily  Super  Magnesium  Triflex  Valsartan-Hydrochlorothiazide 320 mg-25 mg tablet 1 tablet PO Daily  Vitamin B12  Vitamin D3  Zinc     GU PSH: Cystoscopy - 01/13/2022 Locm 300-'399Mg'$ /Ml Iodine,1Ml - 01/28/2022       PSH Notes: Appendectomy, Appendectomy, Ovarian Cystectomy   NON-GU PSH: Appendectomy - 2007, 2007 Remove Ovarian Cyst(s) - 2007     GU PMH: Left renal neoplasm - 02/08/2022 Gross hematuria - 01/28/2022, - 01/13/2022 Mixed incontinence - 01/13/2022 Post-void dribbling - 08/19/2020, - 06/05/2020, - 04/17/2020 Stress Incontinence - 08/19/2020, - 06/05/2020, Female stress incontinence, - 2014 Urinary Frequency - 06/05/2020, Increased urinary frequency, - 2017 Nocturia - 2020 Chronic cystitis (w/o hematuria), Chronic cystitis - 2017 Oth GU systems Signs/Symptoms, Bladder pain - 2015 Renal cyst, Renal cyst, acquired - 2014      PMH Notes:  2006-08-22 14:57:20 - Note: Urinary Tract Infection  2006-08-22 14:57:20 - Note: Allergy Therapy   NON-GU PMH: Pain in right hip (Stable) - 08/19/2020, - 2021 Encounter for general adult medical examination without abnormal findings, Encounter for preventive health examination - 2017 Personal history of other endocrine, nutritional and metabolic disease, History of diabetes mellitus - 2017, History of hypercholesterolemia, - 2014 Candidiasis, unspecified, Yeast infection - 2016 Allergy status to unspecified drugs, medicaments and biological substances, Allergies - 2014 Personal history of other diseases of the circulatory system, History of hypertension - 2014, History of hypertension, - 2014 Personal history of other diseases of the digestive system, History of esophageal reflux - 2014 Personal history of other specified conditions, History of heartburn - 2014 Diabetes Type 2 Hypertension Obesity    FAMILY HISTORY: Urologic Disorder - Runs In Family  SOCIAL HISTORY: Marital Status: Single Preferred Language: English; Ethnicity: Not Hispanic Or  Latino; Race: White Current Smoking Status: Patient has never smoked.   Tobacco Use Assessment Completed: Used Tobacco in last 30 days? Social Drinker.  Drinks 2 caffeinated drinks per day.     Notes: Never smoker, Marital History - Single, Alcohol Use, Caffeine Use   REVIEW OF SYSTEMS:    GU Review Female:   Patient denies frequent urination, hard to postpone urination, burning /pain with urination, get up at night to urinate, leakage of urine, stream starts and stops, trouble starting your stream, have to strain to urinate, and being pregnant.  Gastrointestinal (Upper):   Patient denies nausea, vomiting, and indigestion/ heartburn.  Gastrointestinal (Lower):   Patient denies diarrhea and constipation.  Constitutional:   Patient denies fever, night sweats, weight loss, and fatigue.  Skin:   Patient denies skin rash/ lesion and itching.  Eyes:   Patient denies blurred vision and double vision.  Ears/ Nose/ Throat:   Patient denies sore throat and sinus problems.  Cardiovascular:   Patient denies leg swelling and chest pains.  Respiratory:   Patient denies cough and shortness of breath.  Musculoskeletal:   Patient denies back pain and joint pain.  Neurological:   Patient denies headaches and dizziness.  Psychologic:   Patient denies depression and anxiety.   VITAL SIGNS:      04/06/2022 10:22 AM  BP 128/79 mmHg  Pulse 64 /min  Temperature 97.5 F / 36.3 C   MULTI-SYSTEM PHYSICAL EXAMINATION:    Constitutional: Well-nourished. No physical deformities. Normally developed. Good grooming.  Neck: Neck symmetrical, not swollen. Normal tracheal position.   Respiratory: Normal breath sounds. No labored breathing, no use of accessory muscles.   Cardiovascular: Regular rate and rhythm. No murmur, no gallop. Normal temperature, normal extremity pulses, no swelling, no varicosities.   Skin: No paleness, no jaundice, no cyanosis. No lesion, no ulcer, no rash.  Neurologic / Psychiatric: Oriented  to time, oriented to place, oriented to person. No depression, no anxiety, no agitation.  Gastrointestinal: No mass, no tenderness, no rigidity, non obese abdomen.  Eyes: Normal conjunctivae. Normal eyelids.  Ears, Nose, Mouth, and Throat: Left ear no scars, no lesions, no masses. Right ear no scars, no lesions, no masses. Nose no scars, no lesions, no masses. Normal hearing. Normal lips.  Musculoskeletal: Normal gait and station of head and neck.     Complexity of Data:  Source Of History:  Patient  Records Review:   Previous Doctor Records, Previous Patient Records  Urine Test Review:   Urinalysis   04/06/22  Urinalysis  Urine Appearance Cloudy   Urine Color Yellow   Urine Glucose Neg mg/dL  Urine Bilirubin Neg mg/dL  Urine Ketones Neg mg/dL  Urine Specific Gravity 1.010   Urine Blood 1+ ery/uL  Urine pH 5.5   Urine Protein Neg mg/dL  Urine Urobilinogen 0.2 mg/dL  Urine Nitrites Neg   Urine Leukocyte Esterase 3+ leu/uL  Urine WBC/hpf >60/hpf   Urine RBC/hpf 0 - 2/hpf   Urine Epithelial Cells 0 - 5/hpf   Urine Bacteria Rare (0-9/hpf)   Urine Mucous Not Present   Urine Yeast NS (Not Seen)   Urine Trichomonas Not Present   Urine Cystals NS (Not Seen)   Urine Casts NS (Not Seen)   Urine Sperm Not Present    PROCEDURES:          Urinalysis w/Scope Dipstick Dipstick Cont'd Micro  Color: Yellow Bilirubin: Neg mg/dL WBC/hpf: >  60/hpf  Appearance: Cloudy Ketones: Neg mg/dL RBC/hpf: 0 - 2/hpf  Specific Gravity: 1.010 Blood: 1+ ery/uL Bacteria: Rare (0-9/hpf)  pH: 5.5 Protein: Neg mg/dL Cystals: NS (Not Seen)  Glucose: Neg mg/dL Urobilinogen: 0.2 mg/dL Casts: NS (Not Seen)    Nitrites: Neg Trichomonas: Not Present    Leukocyte Esterase: 3+ leu/uL Mucous: Not Present      Epithelial Cells: 0 - 5/hpf      Yeast: NS (Not Seen)      Sperm: Not Present    ASSESSMENT:      ICD-10 Details  1 GU:   Chronic cystitis (w/o hematuria) - N30.20 Chronic, Stable  2   Left renal neoplasm  - D49.512 Chronic, Stable   PLAN:           Orders Labs CULTURE, URINE          Document Letter(s):  Created for Patient: Clinical Summary         Notes:   She will keep her upcoming surgery as scheduled on 04/16/2022 for a left partial nephrectomy. She understands to notify our clinic if she has any changes to her health history in the interval. Her urine will be sent for precautionary culture today. All questions were answered to the best of my ability  Interval:  -I personally reviewed imaging results and films with the patient. We discussed that the mass in question has features concerning for malignancy. I explained the natural history of presumed renal cell carcinoma. I reviewed the AUA guidelines for evaluation and treatment of the small renal mass. The options of active surveillance, in situ tumor ablation, partial and radical nephrectomy was discussed. The risks of robot-assisted LEFT partial nephrectomy were discussed in detail including but not limited to: negative pathology, open conversion, completion nephrectomy, infection of the urinary tract/skin/abdominal cavity, VTE, MI/CVA, lymphatic leak, injury to adjacent solid/hollow viscus organs, bleeding requiring a blood transfusion, catastrophic bleeding, hernia formation, need for postoperative angioembolization, urinary leak requiring stent/drain, and other imponderables.  She voices understanding and wishes to proceed.

## 2022-04-17 ENCOUNTER — Encounter (HOSPITAL_COMMUNITY): Payer: Self-pay | Admitting: Urology

## 2022-04-17 DIAGNOSIS — C642 Malignant neoplasm of left kidney, except renal pelvis: Secondary | ICD-10-CM | POA: Diagnosis not present

## 2022-04-17 LAB — BASIC METABOLIC PANEL
Anion gap: 9 (ref 5–15)
BUN: 11 mg/dL (ref 8–23)
CO2: 23 mmol/L (ref 22–32)
Calcium: 8.5 mg/dL — ABNORMAL LOW (ref 8.9–10.3)
Chloride: 101 mmol/L (ref 98–111)
Creatinine, Ser: 0.65 mg/dL (ref 0.44–1.00)
GFR, Estimated: >60 mL/min (ref >60)
Glucose, Bld: 153 mg/dL — ABNORMAL HIGH (ref 70–99)
Potassium: 3.3 mmol/L — ABNORMAL LOW (ref 3.5–5.1)
Sodium: 133 mmol/L — ABNORMAL LOW (ref 135–145)

## 2022-04-17 LAB — HEMOGLOBIN AND HEMATOCRIT, BLOOD
HCT: 36.8 % (ref 36.0–46.0)
Hemoglobin: 12.3 g/dL (ref 12.0–15.0)

## 2022-04-17 LAB — GLUCOSE, CAPILLARY
Glucose-Capillary: 132 mg/dL — ABNORMAL HIGH (ref 70–99)
Glucose-Capillary: 143 mg/dL — ABNORMAL HIGH (ref 70–99)

## 2022-04-17 NOTE — Discharge Summary (Signed)
Date of admission: 04/16/2022  Date of discharge: 04/17/2022  Admission diagnosis: left renal mass  Discharge diagnosis: same  Secondary diagnoses:  Patient Active Problem List   Diagnosis Date Noted   Renal mass 04/16/2022   Left renal mass 02/18/2022   Acute hearing loss, right 08/26/2021   Chronic venous insufficiency 07/28/2021   Varicose veins of both lower extremities 06/15/2021   Statin myopathy 12/24/2020   Chronic allergic conjunctivitis 12/15/2020   Vasomotor rhinitis 12/15/2020   Abnormal urine odor 08/06/2020   AKI (acute kidney injury) ([place]) 11/06/2019   Abdominal pain 08/08/2018   Controlled type 2 diabetes mellitus without complication ([place]) 85/88/5027   URI (upper respiratory infection) 04/12/2018   Osteoarthritis of right knee 12/20/2017   Pain in right knee 11/28/2017   Pain of right calf 11/28/2017   Acute upper respiratory infection 10/05/2017   Peripheral edema 03/31/2017   Sinusitis, chronic 09/17/2016   Cough 09/09/2016   Contact dermatitis 08/28/2014   Obesity 07/02/2014   Rash and nonspecific skin eruption 07/02/2014   Bronchospasm 05/07/2014   Dysplasia of cervix, low grade (CIN 1) 09/01/2012   Urinary urgency 05/16/2012   Encounter for well adult exam with abnormal findings 07/04/2011   THYROID NODULE, LEFT 11/04/2010   Diabetes ([place]) 10/26/2010   PYELONEPHRITIS, ACUTE 09/23/2010   VAGINITIS, CANDIDAL 11/26/2009   SKIN LESION 06/19/2009   GOITER, MULTINODULAR 03/10/2009   Palpitations 03/10/2009   Headache(784.0) 11/13/2008   Urinary frequency 09/11/2008   History of colonic polyps 07/04/2008   UTI'S, RECURRENT 11/30/2007   ENDOMETRIOSIS 11/30/2007   Dysmenorrhea 11/30/2007   Rosacea 11/30/2007   GASTRIC POLYP 08/04/2007   GERD 08/04/2007   ISCHEMIC COLITIS, HX OF 07/06/2007   Hyperlipidemia 04/06/2007   ANXIETY 04/06/2007   Essential hypertension 04/06/2007   Allergic rhinitis 04/06/2007   ENDOMETRIAL POLYP 01/11/2002     Procedures performed: Procedure(s): XI ROBOTIC ASSITED PARTIAL NEPHRECTOMY  History and Physical: For full details, please see admission history and physical. Briefly, [name] [name] is a 66 y.o. year old patient with left renal mass concerning for malignancy.   Hospital Course: Patient tolerated the procedure well.  She was then transferred to the floor after an uneventful PACU stay.  Her hospital course was uncomplicated.  On POD#1 she had met discharge criteria: was eating a regular diet, was up and ambulating independently,  pain was well controlled, was voiding without a catheter, and was ready to for discharge.   Laboratory values:  Recent Labs    04/16/22 1649 04/17/22 0415  HGB 13.0 12.3  HCT 39.5 36.8   Recent Labs    04/17/22 0415  NA 133*  K 3.3*  CL 101  CO2 23  GLUCOSE 153*  BUN 11  CREATININE 0.65  CALCIUM 8.5*   No results for input(s): "LABPT", "INR" in the last 72 hours. No results for input(s): "LABURIN" in the last 72 hours. Results for orders placed or performed in visit on 11/18/21  Urine Culture     Status: None   Collection Time: 11/18/21  3:12 PM   Specimen: Urine  Result Value Ref Range Status   MICRO NUMBER: [id number]  Final   SPECIMEN QUALITY: Adequate  Final   Sample Source NOT GIVEN  Final   STATUS: FINAL  Final   ISOLATE 1:   Final    Mixed genital flora isolated. These superficial bacteria are not indicative of a urinary tract infection. No further organism identification is warranted on this specimen. If clinically indicated, recollect  clean-catch, mid-stream urine and transfer  immediately to Urine Culture Transport Tube.     Disposition: Home  Discharge instruction: The patient was instructed to be ambulatory but told to refrain from heavy lifting, strenuous activity, or driving.   Discharge medications:  Allergies as of 04/17/2022       Reactions   Avelox [moxifloxacin] Diarrhea   Cefuroxime Axetil Diarrhea    Ciprofloxacin Diarrhea   Crab Extract Allergy Skin Test Diarrhea, Nausea And Vomiting   ONLY CRAB ALLERGY (no other shellfish allergies)   Crestor [rosuvastatin] Other (See Comments)   myalgias   Lipitor [atorvastatin Calcium] Other (See Comments)   Sensitive (extreme muscle aches)   Latex Rash   Band-aids if left on too long        Medication List     STOP taking these medications    Coenzyme Q10 200 MG capsule   FLEX-DS PO   Magnesium 400 MG Tabs   Vitamin D3 125 MCG (5000 UT) Tabs   Zinc 50 MG Tabs       TAKE these medications    acetaminophen 500 MG tablet Commonly known as: TYLENOL Take 500-1,000 mg by mouth every 6 (six) hours as needed for moderate pain.   BIOFREEZE EX Apply 1 Application topically 3 (three) times daily as needed (muscle pain/aches.).   Contour Blood Glucose System w/Device Kit Use to check blood sugar once daily E11.9   Contour Test test strip Generic drug: glucose blood Use to check blood sugar once daily e11.9   docusate sodium 100 MG capsule Commonly known as: COLACE Take 1 capsule (100 mg total) by mouth 2 (two) times daily.   guaiFENesin 600 MG 12 hr tablet Commonly known as: MUCINEX Take 600 mg by mouth in the morning.   HYDROcodone-acetaminophen 5-325 MG tablet Commonly known as: Norco Take 1-2 tablets by mouth every 6 (six) hours as needed for moderate pain.   levocetirizine 5 MG tablet Commonly known as: XYZAL Take 5 mg by mouth every evening.   lovastatin 40 MG tablet Commonly known as: MEVACOR Take 1 tablet (40 mg total) by mouth at bedtime. What changed: how much to take   omeprazole 20 MG capsule Commonly known as: PRILOSEC Take 20 mg by mouth daily as needed (indigestion/heartburn.).   onetouch ultrasoft lancets Use to check blood sugar once daily   oxymetazoline 0.05 % nasal spray Commonly known as: AFRIN Place 1 spray into both nostrils 2 (two) times daily as needed for congestion.   potassium  chloride SA 20 MEQ tablet Commonly known as: KLOR-CON M TAKE 1 TABLET BY MOUTH TWICE  DAILY   promethazine 12.5 MG tablet Commonly known as: PHENERGAN Take 1 tablet (12.5 mg total) by mouth every 4 (four) hours as needed for nausea or vomiting.   trolamine salicylate 10 % cream Commonly known as: ASPERCREME Apply 1 Application topically as needed for muscle pain.   valsartan-hydrochlorothiazide 320-25 MG tablet Commonly known as: DIOVAN-HCT TAKE 1 TABLET BY MOUTH DAILY        Followup:   Follow-up Information     Ceasar Mons, MD Follow up on 04/30/2022.   Specialty: Urology Why: at 8:30 Contact information: [address] [address] [phone]

## 2022-04-17 NOTE — Progress Notes (Signed)
Urology Inpatient Progress Report  Renal mass [N28.89]  Procedure(s): XI ROBOTIC ASSITED PARTIAL NEPHRECTOMY  1 Day Post-Op   Intv/Subj: No acute events overnight. Patient is without complaint.  Principal Problem:   Renal mass  Current Facility-Administered Medications  Medication Dose Route Frequency Provider Last Rate Last Admin   acetaminophen (TYLENOL) tablet 1,000 mg  1,000 mg Oral Q6H Dancy, Amanda, PA-C   1,000 mg at 04/17/22 2878   diphenhydrAMINE (BENADRYL) injection 12.5 mg  12.5 mg Intravenous Q6H PRN Debbrah Alar, PA-C       Or   diphenhydrAMINE (BENADRYL) 12.5 MG/5ML elixir 12.5 mg  12.5 mg Oral Q6H PRN Dancy, Amanda, PA-C       docusate sodium (COLACE) capsule 100 mg  100 mg Oral BID Debbrah Alar, PA-C   100 mg at 04/17/22 0901   HYDROmorphone (DILAUDID) injection 0.5-1 mg  0.5-1 mg Intravenous Q2H PRN Debbrah Alar, PA-C   0.5 mg at 04/17/22 1126   hyoscyamine (LEVSIN SL) SL tablet 0.125 mg  0.125 mg Sublingual Q4H PRN Debbrah Alar, PA-C       insulin aspart (novoLOG) injection 0-15 Units  0-15 Units Subcutaneous TID WC Dancy, Amanda, PA-C       ondansetron (ZOFRAN) injection 4 mg  4 mg Intravenous Q4H PRN Debbrah Alar, PA-C       oxyCODONE (Oxy IR/ROXICODONE) immediate release tablet 5 mg  5 mg Oral Q4H PRN Debbrah Alar, PA-C   5 mg at 04/17/22 0907   pantoprazole (PROTONIX) EC tablet 40 mg  40 mg Oral Daily Debbrah Alar, PA-C   40 mg at 04/17/22 0901   pravastatin (PRAVACHOL) tablet 40 mg  40 mg Oral q1800 Debbrah Alar, PA-C   40 mg at 04/16/22 1832     Objective: Vital: Vitals:   04/16/22 1807 04/16/22 2047 04/17/22 0100 04/17/22 0518  BP: (!) 167/80 (!) 150/78 126/67 134/66  Pulse: 84 85 77 72  Resp: '18 18 18 19  '$ Temp: 97.7 F (36.5 C) 98.1 F (36.7 C) 97.9 F (36.6 C) 98.2 F (36.8 C)  TempSrc:  Oral Oral Oral  SpO2: 98% 98% 99% 97%  Weight:    79.2 kg  Height:       I/Os: I/O last 3 completed shifts: In: 2691.6 [P.O.:250; I.V.:2341.6;  IV Piggyback:100] Out: 2790 [Urine:2750; Blood:40]  Physical Exam:  General: Patient is in no apparent distress Lungs: Normal respiratory effort, chest expands symmetrically. GI: Incisions are c/d/i. The abdomen is soft and nontender without mass. JP drain with serosanguinous drainage Foley: clear urine  Ext: lower extremities symmetric  Lab Results: Recent Labs    04/16/22 1649 04/17/22 0415  HGB 13.0 12.3  HCT 39.5 36.8   Recent Labs    04/17/22 0415  NA 133*  K 3.3*  CL 101  CO2 23  GLUCOSE 153*  BUN 11  CREATININE 0.65  CALCIUM 8.5*   No results for input(s): "LABPT", "INR" in the last 72 hours. No results for input(s): "LABURIN" in the last 72 hours. Results for orders placed or performed in visit on 11/18/21  Urine Culture     Status: None   Collection Time: 11/18/21  3:12 PM   Specimen: Urine  Result Value Ref Range Status   MICRO NUMBER: [id number]  Final   SPECIMEN QUALITY: Adequate  Final   Sample Source NOT GIVEN  Final   STATUS: FINAL  Final   ISOLATE 1:   Final    Mixed genital flora isolated. These superficial bacteria are not  indicative of a urinary tract infection. No further organism identification is warranted on this specimen. If clinically indicated, recollect clean-catch, mid-stream urine and transfer  immediately to Urine Culture Transport Tube.     Studies/Results: No results found.  Assessment: Procedure(s): XI ROBOTIC ASSITED PARTIAL NEPHRECTOMY, 1 Day Post-Op  doing well.  Plan: Wean O2 Voiding trial ADAT HLIVF Ambulate  Expect she'll be ready for discharge later this PM.   Louis Meckel, MD Urology 04/17/2022, 11:34 AM

## 2022-04-17 NOTE — TOC CM/SW Note (Signed)
  Transition of Care [facility]) Screening Note   Patient Details  Name: [name] [name] Date of Birth: Sep 27, 1955   [facility]) CM/SW Contact:    Ross Ludwig, LCSW Phone Number: 04/17/2022, 11:42 AM    Transition of Care Department [facility]) has reviewed patient and no TOC needs have been identified at this time. We will continue to monitor patient advancement through interdisciplinary progression rounds. If new patient transition needs arise, please place a TOC consult.

## 2022-04-17 NOTE — Plan of Care (Signed)
  Problem: Coping: Goal: Ability to adjust to condition or change in health will improve Outcome: Progressing   Problem: Pain Managment: Goal: General experience of comfort will improve Outcome: Progressing   

## 2022-04-20 LAB — SURGICAL PATHOLOGY

## 2022-05-27 ENCOUNTER — Other Ambulatory Visit: Payer: 59 | Admitting: Obstetrics and Gynecology

## 2022-05-27 ENCOUNTER — Other Ambulatory Visit: Payer: 59

## 2022-06-03 ENCOUNTER — Ambulatory Visit (INDEPENDENT_AMBULATORY_CARE_PROVIDER_SITE_OTHER): Payer: 59

## 2022-06-03 ENCOUNTER — Ambulatory Visit (INDEPENDENT_AMBULATORY_CARE_PROVIDER_SITE_OTHER): Payer: 59 | Admitting: Obstetrics and Gynecology

## 2022-06-03 ENCOUNTER — Encounter: Payer: Self-pay | Admitting: Obstetrics and Gynecology

## 2022-06-03 VITALS — BP 132/82 | HR 88 | Wt 162.0 lb

## 2022-06-03 DIAGNOSIS — N9489 Other specified conditions associated with female genital organs and menstrual cycle: Secondary | ICD-10-CM

## 2022-06-03 NOTE — Progress Notes (Signed)
GYNECOLOGY  VISIT   HPI: 66 y.o.   Single White or Caucasian Not Hispanic or Latino  female   G0P0 with No LMP recorded. Patient is postmenopausal.   here for ultrasound consult  for f/u on a cystic adnexal mass noted on u/s in 5/23. The U/S was done for PMP bleeding. Since that u/s she had a hysteroscopic polypectomy, D&C with benign pathology.  On 04/16/22 she is s/p resection of a left renal mass that returned with clear cell renal cell carcinoma. There is no mention of her pelvis in the operative note from her 7/23 surgery.   GYNECOLOGIC HISTORY: No LMP recorded. Patient is postmenopausal. Contraception:pmp Menopausal hormone therapy: none         OB History     Gravida  0   Para      Term      Preterm      AB      Living         SAB      IAB      Ectopic      Multiple      Live Births                 Patient Active Problem List   Diagnosis Date Noted   Renal mass 04/16/2022   Left renal mass 02/18/2022   Acute hearing loss, right 08/26/2021   Chronic venous insufficiency 07/28/2021   Varicose veins of both lower extremities 06/15/2021   Statin myopathy 12/24/2020   Chronic allergic conjunctivitis 12/15/2020   Vasomotor rhinitis 12/15/2020   Abnormal urine odor 08/06/2020   AKI (acute kidney injury) ([place]) 11/06/2019   Abdominal pain 08/08/2018   Controlled type 2 diabetes mellitus without complication ([place]) 16/38/4536   URI (upper respiratory infection) 04/12/2018   Osteoarthritis of right knee 12/20/2017   Pain in right knee 11/28/2017   Pain of right calf 11/28/2017   Acute upper respiratory infection 10/05/2017   Peripheral edema 03/31/2017   Sinusitis, chronic 09/17/2016   Cough 09/09/2016   Contact dermatitis 08/28/2014   Obesity 07/02/2014   Rash and nonspecific skin eruption 07/02/2014   Bronchospasm 05/07/2014   Dysplasia of cervix, low grade (CIN 1) 09/01/2012   Urinary urgency 05/16/2012   Encounter for well adult exam with  abnormal findings 07/04/2011   THYROID NODULE, LEFT 11/04/2010   Diabetes ([place]) 10/26/2010   PYELONEPHRITIS, ACUTE 09/23/2010   VAGINITIS, CANDIDAL 11/26/2009   SKIN LESION 06/19/2009   GOITER, MULTINODULAR 03/10/2009   Palpitations 03/10/2009   Headache(784.0) 11/13/2008   Urinary frequency 09/11/2008   History of colonic polyps 07/04/2008   UTI'S, RECURRENT 11/30/2007   ENDOMETRIOSIS 11/30/2007   Dysmenorrhea 11/30/2007   Rosacea 11/30/2007   GASTRIC POLYP 08/04/2007   GERD 08/04/2007   ISCHEMIC COLITIS, HX OF 07/06/2007   Hyperlipidemia 04/06/2007   ANXIETY 04/06/2007   Essential hypertension 04/06/2007   Allergic rhinitis 04/06/2007   ENDOMETRIAL POLYP 01/11/2002    Past Medical History:  Diagnosis Date   ALLERGIC RHINITIS 04/06/2007   ANXIETY 04/06/2007   Arthritis    Cataract    CIN I (cervical intraepithelial neoplasia I)    COLONIC POLYPS 08/04/2007   Diabetes mellitus without complication ([place])    DIARRHEA, ACUTE 09/23/2010   Family history of adverse reaction to anesthesia    Mother and brother hard to wake up after anesthesia   Fibroid    GASTRIC POLYP 08/04/2007   GERD 08/04/2007   GOITER, MULTINODULAR 03/10/2009   Headache(784.0) 11/13/2008  Hx of blood clots    HYPERLIPIDEMIA 04/06/2007   HYPERTENSION 04/06/2007   ISCHEMIC COLITIS, HX OF 24/40/1027   Lichen sclerosus 2536   Osteopenia 08/2018   T score -1.1 FRAX 7% / 0.5%   Palpitations 03/10/2009   Renal mass    Left kidney   Rosacea 11/30/2007   THYROID NODULE, LEFT 11/04/2010   Urinary frequency    UTI'S, RECURRENT 11/30/2007   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     Past Surgical History:  Procedure Laterality Date   APPENDECTOMY  09/21/2003   BIOPSY THYROID     x2   bladder tack     COLPOSCOPY     CYSTOSCOPY  09/20/2005   DERMOID CYST  EXCISION  09/20/1992   right ovary   DILATATION & CURETTAGE/HYSTEROSCOPY WITH MYOSURE N/A 03/22/2022   Procedure: DILATATION  & CURETTAGE/HYSTEROSCOPY WITH MYOSURE;  Surgeon: Salvadore Dom, MD;  Location: [facility];  Service: Gynecology;  Laterality: N/A;   FLEXIBLE SIGMOIDOSCOPY  11/16/1995   Lt. pectoral/subclavicular abscess drainage  09/20/2004   removed skull cyst  09/21/1971   right foot neuroma  09/20/1986   ROBOTIC ASSITED PARTIAL NEPHRECTOMY Left 04/16/2022   Procedure: XI ROBOTIC ASSITED PARTIAL NEPHRECTOMY;  Surgeon: Ceasar Mons, MD;  Location: [facility];  Service: Urology;  Laterality: Left;   uterine fibroids removed  09/20/1994    Current Outpatient Medications  Medication Sig Dispense Refill   acetaminophen (TYLENOL) 500 MG tablet Take 500-1,000 mg by mouth every 6 (six) hours as needed for moderate pain.     Blood Glucose Monitoring Suppl (CONTOUR BLOOD GLUCOSE SYSTEM) w/Device KIT Use to check blood sugar once daily E11.9 1 kit 0   glucose blood (CONTOUR TEST) test strip Use to check blood sugar once daily e11.9 300 each 3   guaiFENesin (MUCINEX) 600 MG 12 hr tablet Take 600 mg by mouth in the morning.     Lancets (ONETOUCH ULTRASOFT) lancets Use to check blood sugar once daily 300 each 3   levocetirizine (XYZAL) 5 MG tablet Take 5 mg by mouth every evening.     lovastatin (MEVACOR) 40 MG tablet Take 1 tablet (40 mg total) by mouth at bedtime. (Patient taking differently: Take 20 mg by mouth at bedtime.) 90 tablet 3   Menthol, Topical Analgesic, (BIOFREEZE EX) Apply 1 Application topically 3 (three) times daily as needed (muscle pain/aches.).     omeprazole (PRILOSEC) 20 MG capsule Take 20 mg by mouth daily as needed (indigestion/heartburn.).     oxymetazoline (AFRIN) 0.05 % nasal spray Place 1 spray into both nostrils 2 (two) times daily as needed for congestion.     potassium chloride SA (KLOR-CON M) 20 MEQ tablet TAKE 1 TABLET BY MOUTH TWICE  DAILY 180 tablet 3   trolamine salicylate (ASPERCREME) 10 % cream Apply 1 Application topically as needed for muscle pain.      valsartan-hydrochlorothiazide (DIOVAN-HCT) 320-25 MG tablet TAKE 1 TABLET BY MOUTH DAILY 90 tablet 3   promethazine (PHENERGAN) 12.5 MG tablet Take 1 tablet (12.5 mg total) by mouth every 4 (four) hours as needed for nausea or vomiting. (Patient not taking: Reported on 06/03/2022) 15 tablet 0   No current facility-administered medications for this visit.     ALLERGIES: Avelox [moxifloxacin], Cefuroxime axetil, Ciprofloxacin, Crab extract allergy skin test, Crestor [rosuvastatin], Lipitor [atorvastatin calcium], and Latex  Family History  Problem Relation Age of Onset   Diabetes Mother    Hypertension Mother    Heart disease  Mother    Lung cancer Mother    Heart disease Father    Stroke Father    Emphysema Father    Stroke Brother    Deep vein thrombosis Brother    Macular degeneration Brother    Heart attack Brother    Lung cancer Maternal Uncle    Breast cancer Paternal Aunt        Age 56's   Lung cancer Maternal Grandmother    Colon cancer Neg Hx    Stomach cancer Neg Hx    Rectal cancer Neg Hx    Esophageal cancer Neg Hx    Liver cancer Neg Hx     Social History   Socioeconomic History   Marital status: Single    Spouse name: Not on file   Number of children: Not on file   Years of education: Not on file   Highest education level: Not on file  Occupational History   Not on file  Tobacco Use   Smoking status: Never   Smokeless tobacco: Never   Tobacco comments:    passive tobacco smoke exposure as a child  Vaping Use   Vaping Use: Never used  Substance and Sexual Activity   Alcohol use: Yes    Alcohol/week: 0.0 standard drinks of alcohol    Comment: Rare   Drug use: No   Sexual activity: Not Currently    Birth control/protection: Post-menopausal    Comment: 1st intercourse 66 yo-Fewer than 5 partners  Other Topics Concern   Not on file  Social History Narrative   Not on file   Social Determinants of Health   Financial Resource Strain: Not on file   Food Insecurity: Not on file  Transportation Needs: Not on file  Physical Activity: Not on file  Stress: Not on file  Social Connections: Not on file  Intimate Partner Violence: Not on file    Review of Systems  All other systems reviewed and are negative.   PHYSICAL EXAMINATION:    BP 132/82   Pulse 88   Wt 162 lb (73.5 kg)   SpO2 98%   BMI 29.87 kg/m     General appearance: alert, cooperative and appears stated age  Pelvic ultrasound  Indications: f/u left adnexal cyst mass  Findings:  Uterus 4.75 x 3.6 x 2.63 cm, anteverted Single anterior/fundal fibroid 0.85 x 0.65 cm (unchanged)  Endometrium 1.97 cm, no masses  Left ovary 1.61 x 1.01 x 0.87 cm Adjacent to the left ovary is a stable appearing serpiginous, cystic structure. C/W hydrosalpinx. 3.22 x 0.62 cm.   Right ovary surgically absent   No free fluid  Impression:  Small, anteverted uterus Single fundal fibroid Thin, symmetrical endometrium Right ovary surgically absent Normal left ovary Stable appearing cystic structure adjacent to the left ovary, c/w hydrosalpinx  1. Adnexal mass Stable, c/w left hydrosalpinx No F/U needed

## 2022-08-20 ENCOUNTER — Ambulatory Visit: Payer: 59 | Admitting: Internal Medicine

## 2022-08-23 ENCOUNTER — Ambulatory Visit: Payer: 59 | Admitting: Nurse Practitioner

## 2022-08-24 ENCOUNTER — Ambulatory Visit: Payer: 59 | Admitting: Nurse Practitioner

## 2022-08-25 ENCOUNTER — Ambulatory Visit: Payer: 59 | Admitting: Obstetrics and Gynecology

## 2022-08-25 NOTE — Progress Notes (Signed)
66 y.o. G0P0 Single White or Caucasian Not Hispanic or Latino female here for annual exam.  No vaginal bleeding.   No bowel or bladder c/o.    H/o endometriosis and removal of her right ovary for a dermoid cyst.    She had a hysteroscopy, polypectomy, D&C in 7/23 for PMP bleeding. Pathology was benign.   No LMP recorded. Patient is postmenopausal.          Sexually active: No.  The current method of family planning is post menopausal status.    Exercising: Yes.    walking Smoker:  no  Health Maintenance: Pap:  08/14/19 WNL, neg hpv; 09/04/14 WNL hr hpv neg History of abnormal Pap:  yes had a colposcopy, not aware of surgery on her cervix.  MMG:  10/19/21 Bi-rads 1 neg  BMD:   09/06/18  osteopenia, T score -1.1, f/u 5 years  Colonoscopy: 12/15/16 normal f/u 10 years  TDaP:  05/06/20  Gardasil: n/a   reports that she has never smoked. She has never used smokeless tobacco. She reports current alcohol use. She reports that she does not use drugs. 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   Past Medical History:  Diagnosis Date   ALLERGIC RHINITIS 04/06/2007   ANXIETY 04/06/2007   Arthritis    Cataract    CIN I (cervical intraepithelial neoplasia I)    COLONIC POLYPS 08/04/2007   Diabetes mellitus without complication ([place])    DIARRHEA, ACUTE 09/23/2010   Family history of adverse reaction to anesthesia    Mother and brother hard to wake up after anesthesia   Fibroid    GASTRIC POLYP 08/04/2007   GERD 08/04/2007   GOITER, MULTINODULAR 03/10/2009   Headache(784.0) 11/13/2008   Hx of blood clots    HYPERLIPIDEMIA 04/06/2007   HYPERTENSION 04/06/2007   ISCHEMIC COLITIS, HX OF 63/09/6008   Lichen sclerosus 9323   Osteopenia 08/2018   T score -1.1 FRAX 7% / 0.5%   Palpitations 03/10/2009   Renal mass    Left kidney   Rosacea 11/30/2007   THYROID NODULE, LEFT 11/04/2010   Urinary frequency    UTI'S, RECURRENT 11/30/2007   Varicose veins of lower extremity  with nontruncal reflux     Past Surgical History:  Procedure Laterality Date   APPENDECTOMY  09/21/2003   BIOPSY THYROID     x2   bladder tack     COLPOSCOPY     CYSTOSCOPY  09/20/2005   DERMOID CYST  EXCISION  09/20/1992   right ovary   DILATATION & CURETTAGE/HYSTEROSCOPY WITH MYOSURE N/A 03/22/2022   Procedure: DILATATION & CURETTAGE/HYSTEROSCOPY WITH MYOSURE;  Surgeon: Salvadore Dom, MD;  [facility];  Service: Gynecology;  Laterality: N/A;   FLEXIBLE SIGMOIDOSCOPY  11/16/1995   Lt. pectoral/subclavicular abscess drainage  09/20/2004   removed skull cyst  09/21/1971   right foot neuroma  09/20/1986   ROBOTIC ASSITED PARTIAL NEPHRECTOMY Left 04/16/2022   Procedure: XI ROBOTIC ASSITED PARTIAL NEPHRECTOMY;  Surgeon: Ceasar Mons, MD;  [facility];  Service: Urology;  Laterality: Left;   uterine fibroids removed  09/20/1994    Current Outpatient Medications  Medication Sig Dispense Refill   Blood Glucose Monitoring Suppl (CONTOUR BLOOD GLUCOSE SYSTEM) w/Device KIT Use to check blood sugar once daily E11.9 1 kit 0   glucose blood (CONTOUR TEST) test strip Use to check blood sugar once daily e11.9 300 each 3   guaiFENesin (MUCINEX) 600 MG 12 hr tablet Take 600 mg by  mouth in the morning.     Lancets (ONETOUCH ULTRASOFT) lancets Use to check blood sugar once daily 300 each 3   levocetirizine (XYZAL) 5 MG tablet Take 5 mg by mouth every evening.     lovastatin (MEVACOR) 40 MG tablet TAKE 2 TABLETS BY MOUTH AT  BEDTIME 180 tablet 3   Menthol, Topical Analgesic, (BIOFREEZE EX) Apply 1 Application topically 3 (three) times daily as needed (muscle pain/aches.).     omeprazole (PRILOSEC) 20 MG capsule Take 20 mg by mouth daily as needed (indigestion/heartburn.).     potassium chloride SA (KLOR-CON M) 20 MEQ tablet TAKE 1 TABLET BY MOUTH TWICE  DAILY 180 tablet 3   valsartan-hydrochlorothiazide (DIOVAN-HCT) 320-25 MG tablet TAKE 1 TABLET BY MOUTH DAILY 90 tablet 3    No current facility-administered medications for this visit.    Family History  Problem Relation Age of Onset   Diabetes Mother    Hypertension Mother    Heart disease Mother    Lung cancer Mother    Heart disease Father    Stroke Father    Emphysema Father    Stroke Brother    Deep vein thrombosis Brother    Macular degeneration Brother    Heart attack Brother    Lung cancer Maternal Uncle    Breast cancer Paternal 32        Age 46's   Lung cancer Maternal Grandmother    Colon cancer Neg Hx    Stomach cancer Neg Hx    Rectal cancer Neg Hx    Esophageal cancer Neg Hx    Liver cancer Neg Hx     Review of Systems  All other systems reviewed and are negative.   Exam:   BP 118/80 (BP Location: Right Arm, Patient Position: Sitting, Cuff Size: Normal)   Ht _0  (1.549 m)   Wt 162 lb (73.5 kg)   BMI 30.61 kg/m   Weight change: _1 @ Height:   Height: _2  (154.9 cm)  Ht Readings from Last 3 Encounters:  09/02/22 _3  (1.549 m)  09/01/22 5' 1.75" (1.568 m)  04/16/22 5' 1.75" (1.568 m)    General appearance: alert, cooperative and appears stated age Head: Normocephalic, without obvious abnormality, atraumatic Neck: no adenopathy, supple, symmetrical, trachea midline and thyroid normal to inspection and palpation Lungs: clear to auscultation bilaterally Cardiovascular: regular rate and rhythm Breasts: normal appearance, no masses or tenderness Abdomen: soft, non-tender; non distended,  no masses,  no organomegaly Extremities: extremities normal, atraumatic, no cyanosis or edema Skin: Skin color, texture, turgor normal. No rashes or lesions Lymph nodes: Cervical, supraclavicular, and axillary nodes normal. No abnormal inguinal nodes palpated Neurologic: Grossly normal   Pelvic: External genitalia:  no lesions              Urethra:  normal appearing urethra with no masses, tenderness or lesions              Bartholins and Skenes: normal                  Vagina: normal appearing vagina with normal color and discharge, no lesions              Cervix: no lesions               Bimanual Exam:  Uterus:   no masses or tenderness              Adnexa: no mass, fullness, tenderness  Rectovaginal: Confirms               Anus:  normal sphincter tone, no lesions  Gae Dry, CMA chaperoned for the exam.   1. Well woman exam Mammogram next month No pap this year Colonoscopy UTD No DEXA this year Discussed breast self exam Discussed calcium and vit D intake Labs with primary

## 2022-08-30 ENCOUNTER — Other Ambulatory Visit (HOSPITAL_COMMUNITY): Payer: Self-pay | Admitting: Urology

## 2022-08-30 ENCOUNTER — Other Ambulatory Visit: Payer: 59

## 2022-08-30 ENCOUNTER — Other Ambulatory Visit: Payer: Self-pay | Admitting: Internal Medicine

## 2022-08-30 ENCOUNTER — Ambulatory Visit (HOSPITAL_COMMUNITY)
Admission: RE | Admit: 2022-08-30 | Discharge: 2022-08-30 | Disposition: A | Discharge status: Home / Self Care | Payer: 59 | Source: Ambulatory Visit | Attending: Urology | Admitting: Urology

## 2022-08-30 DIAGNOSIS — Z85528 Personal history of other malignant neoplasm of kidney: Secondary | ICD-10-CM

## 2022-08-30 DIAGNOSIS — E559 Vitamin D deficiency, unspecified: Secondary | ICD-10-CM

## 2022-08-30 DIAGNOSIS — E1165 Type 2 diabetes mellitus with hyperglycemia: Secondary | ICD-10-CM

## 2022-08-30 DIAGNOSIS — E538 Deficiency of other specified B group vitamins: Secondary | ICD-10-CM

## 2022-09-01 ENCOUNTER — Other Ambulatory Visit: Payer: Self-pay | Admitting: Internal Medicine

## 2022-09-01 ENCOUNTER — Ambulatory Visit (INDEPENDENT_AMBULATORY_CARE_PROVIDER_SITE_OTHER): Payer: 59 | Admitting: Internal Medicine

## 2022-09-01 VITALS — BP 122/80 | HR 85 | Temp 98.2°F | Ht 61.75 in | Wt 162.0 lb

## 2022-09-01 DIAGNOSIS — E7800 Pure hypercholesterolemia, unspecified: Secondary | ICD-10-CM | POA: Diagnosis not present

## 2022-09-01 DIAGNOSIS — E042 Nontoxic multinodular goiter: Secondary | ICD-10-CM | POA: Diagnosis not present

## 2022-09-01 DIAGNOSIS — E559 Vitamin D deficiency, unspecified: Secondary | ICD-10-CM | POA: Diagnosis not present

## 2022-09-01 DIAGNOSIS — E538 Deficiency of other specified B group vitamins: Secondary | ICD-10-CM | POA: Diagnosis not present

## 2022-09-01 DIAGNOSIS — I10 Essential (primary) hypertension: Secondary | ICD-10-CM | POA: Diagnosis not present

## 2022-09-01 DIAGNOSIS — E1165 Type 2 diabetes mellitus with hyperglycemia: Secondary | ICD-10-CM

## 2022-09-01 LAB — BASIC METABOLIC PANEL
BUN: 18 mg/dL (ref 6–23)
CO2: 27 mEq/L (ref 19–32)
Calcium: 9.7 mg/dL (ref 8.4–10.5)
Chloride: 100 mEq/L (ref 96–112)
Creatinine, Ser: 0.99 mg/dL (ref 0.40–1.20)
GFR: 59.41 mL/min — ABNORMAL LOW (ref >60.00)
Glucose, Bld: 101 mg/dL — ABNORMAL HIGH (ref 70–99)
Potassium: 3.2 mEq/L — ABNORMAL LOW (ref 3.5–5.1)
Sodium: 138 mEq/L (ref 135–145)

## 2022-09-01 LAB — LIPID PANEL
Cholesterol: 171 mg/dL (ref 0–200)
HDL: 41.7 mg/dL (ref >39.00)
NonHDL: 129.59
Total CHOL/HDL Ratio: 4
Triglycerides: 242 mg/dL — ABNORMAL HIGH (ref 0.0–149.0)
VLDL: 48.4 mg/dL — ABNORMAL HIGH (ref 0.0–40.0)

## 2022-09-01 LAB — HEPATIC FUNCTION PANEL
ALT: 20 U/L (ref 0–35)
AST: 18 U/L (ref 0–37)
Albumin: 4.7 g/dL (ref 3.5–5.2)
Alkaline Phosphatase: 85 U/L (ref 39–117)
Bilirubin, Direct: 0 mg/dL (ref 0.0–0.3)
Total Bilirubin: 0.3 mg/dL (ref 0.2–1.2)
Total Protein: 7.5 g/dL (ref 6.0–8.3)

## 2022-09-01 LAB — VITAMIN D 25 HYDROXY (VIT D DEFICIENCY, FRACTURES): VITD: 63.95 ng/mL (ref 30.00–100.00)

## 2022-09-01 LAB — HEMOGLOBIN A1C: Hgb A1c MFr Bld: 6.4 % (ref 4.6–6.5)

## 2022-09-01 LAB — T4, FREE: Free T4: 0.69 ng/dL (ref 0.60–1.60)

## 2022-09-01 LAB — LDL CHOLESTEROL, DIRECT: Direct LDL: 104 mg/dL

## 2022-09-01 LAB — VITAMIN B12: Vitamin B-12: 411 pg/mL (ref 211–911)

## 2022-09-01 LAB — TSH: TSH: 1.46 u[IU]/mL (ref 0.35–5.50)

## 2022-09-01 NOTE — Progress Notes (Signed)
Patient ID: [name] [name], female   DOB: 1956-01-20, 66 y.o.   MRN: [id number]        Chief Complaint: follow up HTN, HLD, DM, goiter       HPI:  [name] [name] is a 66 y.o. female here overall doing ok, Pt denies chest pain, increased sob or doe, wheezing, orthopnea, PND, increased LE swelling, palpitations, dizziness or syncope.   Pt denies polydipsia, polyuria, or new focal neuro s/s.    Pt denies fever, night sweats, loss of appetite, or other constitutional symptoms  Has lost wt intentionally with better diet.  Denies hyper or hypo thyroid symptoms such as voice, skin or hair change.  .   Wt Readings from Last 3 Encounters:  09/02/22 162 lb (73.5 kg)  09/01/22 162 lb (73.5 kg)  06/03/22 162 lb (73.5 kg)   BP Readings from Last 3 Encounters:  09/02/22 118/80  09/01/22 122/80  06/03/22 132/82         Past Medical History:  Diagnosis Date   ALLERGIC RHINITIS 04/06/2007   ANXIETY 04/06/2007   Arthritis    Cataract    CIN I (cervical intraepithelial neoplasia I)    COLONIC POLYPS 08/04/2007   Diabetes mellitus without complication ([place])    DIARRHEA, ACUTE 09/23/2010   Family history of adverse reaction to anesthesia    Mother and brother hard to wake up after anesthesia   Fibroid    GASTRIC POLYP 08/04/2007   GERD 08/04/2007   GOITER, MULTINODULAR 03/10/2009   Headache(784.0) 11/13/2008   Hx of blood clots    HYPERLIPIDEMIA 04/06/2007   HYPERTENSION 04/06/2007   ISCHEMIC COLITIS, HX OF 83/05/4075   Lichen sclerosus 8088   Osteopenia 08/2018   T score -1.1 FRAX 7% / 0.5%   Palpitations 03/10/2009   Renal mass    Left kidney   Rosacea 11/30/2007   THYROID NODULE, LEFT 11/04/2010   Urinary frequency    UTI'S, RECURRENT 11/30/2007   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    Past Surgical History:  Procedure Laterality Date   APPENDECTOMY  09/21/2003   BIOPSY THYROID     x2   bladder tack     COLPOSCOPY     CYSTOSCOPY  09/20/2005   DERMOID  CYST  EXCISION  09/20/1992   right ovary   DILATATION & CURETTAGE/HYSTEROSCOPY WITH MYOSURE N/A 03/22/2022   Procedure: DILATATION & CURETTAGE/HYSTEROSCOPY WITH MYOSURE;  Surgeon: Salvadore Dom, MD;  [facility];  Service: Gynecology;  Laterality: N/A;   FLEXIBLE SIGMOIDOSCOPY  11/16/1995   Lt. pectoral/subclavicular abscess drainage  09/20/2004   removed skull cyst  09/21/1971   right foot neuroma  09/20/1986   ROBOTIC ASSITED PARTIAL NEPHRECTOMY Left 04/16/2022   Procedure: XI ROBOTIC ASSITED PARTIAL NEPHRECTOMY;  Surgeon: Ceasar Mons, MD;  Location: [facility];  Service: Urology;  Laterality: Left;   uterine fibroids removed  09/20/1994    reports that she has never smoked. She has never used smokeless tobacco. She reports current alcohol use. She reports that she does not use drugs. family history includes Breast cancer in her paternal aunt; Deep vein thrombosis in her brother; Diabetes in her mother; Emphysema in her father; Heart attack in her brother; Heart disease in her father and mother; Hypertension in her mother; Lung cancer in her maternal grandmother, maternal uncle, and mother; Macular degeneration in her brother; Stroke in her brother and father. Allergies  Allergen Reactions   Avelox [Moxifloxacin] Diarrhea   Cefuroxime Axetil  Diarrhea   Ciprofloxacin Diarrhea   Crab Extract Allergy Skin Test Diarrhea and Nausea And Vomiting    ONLY CRAB ALLERGY (no other shellfish allergies)   Crestor [Rosuvastatin] Other (See Comments)    myalgias   Lipitor [Atorvastatin Calcium] Other (See Comments)    Sensitive (extreme muscle aches)   Latex Rash    Band-aids if left on too long   Current Outpatient Medications on File Prior to Visit  Medication Sig Dispense Refill   Blood Glucose Monitoring Suppl (CONTOUR BLOOD GLUCOSE SYSTEM) w/Device KIT Use to check blood sugar once daily E11.9 1 kit 0   glucose blood (CONTOUR TEST) test strip Use to check blood sugar once  daily e11.9 300 each 3   guaiFENesin (MUCINEX) 600 MG 12 hr tablet Take 600 mg by mouth in the morning.     Lancets (ONETOUCH ULTRASOFT) lancets Use to check blood sugar once daily 300 each 3   levocetirizine (XYZAL) 5 MG tablet Take 5 mg by mouth every evening.     Menthol, Topical Analgesic, (BIOFREEZE EX) Apply 1 Application topically 3 (three) times daily as needed (muscle pain/aches.).     omeprazole (PRILOSEC) 20 MG capsule Take 20 mg by mouth daily as needed (indigestion/heartburn.).     potassium chloride SA (KLOR-CON M) 20 MEQ tablet TAKE 1 TABLET BY MOUTH TWICE  DAILY 180 tablet 3   valsartan-hydrochlorothiazide (DIOVAN-HCT) 320-25 MG tablet TAKE 1 TABLET BY MOUTH DAILY 90 tablet 3   No current facility-administered medications on file prior to visit.        ROS:  All others reviewed and negative.  Objective        PE:  BP 122/80 (BP Location: Right Arm, Patient Position: Sitting, Cuff Size: Normal)   Pulse 85   Temp 98.2 F (36.8 C) (Oral)   Ht 5' 1.75" (1.568 m)   Wt 162 lb (73.5 kg)   SpO2 96%   BMI 29.87 kg/m                 Constitutional: Pt appears in NAD               HENT: Head: NCAT.                Right Ear: External ear normal.                 Left Ear: External ear normal.                Eyes: . Pupils are equal, round, and reactive to light. Conjunctivae and EOM are normal               Nose: without d/c or deformity               Neck: Neck supple. Gross normal ROM               Cardiovascular: Normal rate and regular rhythm.                 Pulmonary/Chest: Effort normal and breath sounds without rales or wheezing.                Abd:  Soft, NT, ND, + BS, no organomegaly               Neurological: Pt is alert. At baseline orientation, motor grossly intact               Skin: Skin is warm. No rashes, no other new lesions,  LE edema - none               Psychiatric: Pt behavior is normal without agitation   Micro: none  Cardiac tracings I have  personally interpreted today:  none  Pertinent Radiological findings (summarize): none   Lab Results  Component Value Date   WBC 6.7 04/07/2022   HGB 12.3 04/17/2022   HCT 36.8 04/17/2022   PLT 221 04/07/2022   GLUCOSE 101 (H) 09/01/2022   CHOL 171 09/01/2022   TRIG 242.0 (H) 09/01/2022   HDL 41.70 09/01/2022   LDLDIRECT 104.0 09/01/2022   LDLCALC 92 02/18/2022   ALT 20 09/01/2022   AST 18 09/01/2022   NA 138 09/01/2022   K 3.2 (L) 09/01/2022   CL 100 09/01/2022   CREATININE 0.99 09/01/2022   BUN 18 09/01/2022   CO2 27 09/01/2022   TSH 1.46 09/01/2022   HGBA1C 6.4 09/01/2022   MICROALBUR <0.7 02/18/2022   Assessment/Plan:  ANYJAH ROUNDTREE is a 66 y.o. White or Caucasian [1] female with  has a past medical history of ALLERGIC RHINITIS (04/06/2007), ANXIETY (04/06/2007), Arthritis, Cataract, CIN I (cervical intraepithelial neoplasia I), COLONIC POLYPS (08/04/2007), Diabetes mellitus without complication ([place]), DIARRHEA, ACUTE (09/23/2010), Family history of adverse reaction to anesthesia, Fibroid, GASTRIC POLYP (08/04/2007), GERD (08/04/2007), GOITER, MULTINODULAR (03/10/2009), Headache(784.0) (11/13/2008), blood clots, HYPERLIPIDEMIA (04/06/2007), HYPERTENSION (04/06/2007), ISCHEMIC COLITIS, HX OF (85/69/4370), Lichen sclerosus (0525), Osteopenia (08/2018), Palpitations (03/10/2009), Renal mass, Rosacea (11/30/2007), THYROID NODULE, LEFT (11/04/2010), Urinary frequency, UTI'S, RECURRENT (11/30/2007), and 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  Diabetes Lab Results  Component Value Date   HGBA1C 6.4 09/01/2022   Stable, pt to continue current medical treatment  - diet, wt control, excercise   Essential hypertension BP Readings from Last 3 Encounters:  09/02/22 118/80  09/01/22 122/80  06/03/22 132/82   Stable, pt to continue medical treatment diovan hct 320- 25 gm qd   GOITER, MULTINODULAR Overall stable, for f/u TFTs, declines f/u u/s or new endo  referral for now  Hyperlipidemia Lab Results  Component Value Date   LDLCALC 92 02/18/2022   Uncontrolled, pt to continue current lovastatin 80 mg, for lower chol diet, declines other change for now  Followup: Return in about 6 months (around 03/03/2023).  Cathlean Cower, MD 09/03/2022 8:02 PM [facility]

## 2022-09-01 NOTE — Patient Instructions (Signed)
Please continue all other medications as before, and refills have been done if requested.  Please have the pharmacy call with any other refills you may need.  Please continue your efforts at being more active, low cholesterol diet, and weight control.  Please keep your appointments with your specialists as you may have planned  Please go to the LAB at the blood drawing area for the tests to be done  You will be contacted by phone if any changes need to be made immediately.  Otherwise, you will receive a letter about your results with an explanation, but please check with MyChart first.  Please remember to sign up for MyChart if you have not done so, as this will be important to you in the future with finding out test results, communicating by private email, and scheduling acute appointments online when needed.  Please make an Appointment to return in 6 months, or sooner if needed, also with Lab Appointment for testing done 3-5 days before at the FIRST FLOOR Lab (so this is for TWO appointments - please see the scheduling desk as you leave)  

## 2022-09-01 NOTE — Telephone Encounter (Signed)
Please refill as per office routine med refill policy (all routine meds to be refilled for 3 mo or monthly (per pt preference) up to one year from last visit, then month to month grace period for 3 mo, then further med refills will have to be denied) ? ?

## 2022-09-02 ENCOUNTER — Ambulatory Visit (INDEPENDENT_AMBULATORY_CARE_PROVIDER_SITE_OTHER): Payer: 59 | Admitting: Obstetrics and Gynecology

## 2022-09-02 ENCOUNTER — Encounter: Payer: Self-pay | Admitting: Obstetrics and Gynecology

## 2022-09-02 VITALS — BP 118/80 | HR 86 | Ht 61.0 in | Wt 162.0 lb

## 2022-09-02 DIAGNOSIS — Z01419 Encounter for gynecological examination (general) (routine) without abnormal findings: Secondary | ICD-10-CM

## 2022-09-02 NOTE — Patient Instructions (Signed)

## 2022-09-03 ENCOUNTER — Encounter: Payer: Self-pay | Admitting: Internal Medicine

## 2022-09-03 NOTE — Assessment & Plan Note (Signed)
Overall stable, for f/u TFTs, declines f/u u/s or new endo referral for now

## 2022-09-03 NOTE — Assessment & Plan Note (Signed)
Lab Results  Component Value Date   HGBA1C 6.4 09/01/2022   Stable, pt to continue current medical treatment  - diet, wt control, excercise

## 2022-09-03 NOTE — Assessment & Plan Note (Signed)
Lab Results  Component Value Date   LDLCALC 92 02/18/2022   Uncontrolled, pt to continue current lovastatin 80 mg, for lower chol diet, declines other change for now

## 2022-09-03 NOTE — Assessment & Plan Note (Signed)
BP Readings from Last 3 Encounters:  09/02/22 118/80  09/01/22 122/80  06/03/22 132/82   Stable, pt to continue medical treatment diovan hct 320- 25 gm qd

## 2022-09-07 ENCOUNTER — Other Ambulatory Visit: Payer: Self-pay | Admitting: Obstetrics and Gynecology

## 2022-09-07 DIAGNOSIS — Z1231 Encounter for screening mammogram for malignant neoplasm of breast: Secondary | ICD-10-CM

## 2022-09-16 ENCOUNTER — Telehealth: Payer: Self-pay

## 2022-09-16 MED ORDER — NIRMATRELVIR/RITONAVIR (PAXLOVID) TABLET (RENAL DOSING): 2.0000 | ORAL_TABLET | Freq: Two times a day (BID) | ORAL | 0 refills | Status: AC

## 2022-09-16 MED ORDER — HYDROCODONE BIT-HOMATROP MBR 5-1.5 MG/5ML PO SOLN: 5.0000 mL | Freq: Four times a day (QID) | ORAL | 0 refills | Status: AC | PRN

## 2022-09-16 NOTE — Telephone Encounter (Signed)
[place] for paxlovid renal dosed, cough med prn - both done erx

## 2022-09-16 NOTE — Addendum Note (Signed)
Addended by: Biagio Borg on: 09/16/2022 12:00 PM   Modules accepted: Orders

## 2022-09-16 NOTE — Telephone Encounter (Signed)
Pt states she tested POS for COVID this morning.  SXS started 09/15/2022. SXS are cough, sneezing, congestion and sore throat. Pt is asking that medication be went in to help with sxs.

## 2022-11-01 ENCOUNTER — Ambulatory Visit
Admission: RE | Admit: 2022-11-01 | Discharge: 2022-11-01 | Disposition: A | Discharge status: Home / Self Care | Payer: 59 | Source: Ambulatory Visit | Attending: Obstetrics and Gynecology | Admitting: Obstetrics and Gynecology

## 2022-11-01 DIAGNOSIS — Z1231 Encounter for screening mammogram for malignant neoplasm of breast: Secondary | ICD-10-CM

## 2023-03-01 ENCOUNTER — Other Ambulatory Visit (INDEPENDENT_AMBULATORY_CARE_PROVIDER_SITE_OTHER): Payer: 59

## 2023-03-01 DIAGNOSIS — E1165 Type 2 diabetes mellitus with hyperglycemia: Secondary | ICD-10-CM

## 2023-03-01 DIAGNOSIS — Z13220 Encounter for screening for lipoid disorders: Secondary | ICD-10-CM

## 2023-03-01 DIAGNOSIS — E559 Vitamin D deficiency, unspecified: Secondary | ICD-10-CM | POA: Diagnosis not present

## 2023-03-01 DIAGNOSIS — E538 Deficiency of other specified B group vitamins: Secondary | ICD-10-CM | POA: Diagnosis not present

## 2023-03-01 LAB — CBC WITH DIFFERENTIAL/PLATELET
Basophils Absolute: 0.1 10*3/uL (ref 0.0–0.1)
Basophils Relative: 1.2 % (ref 0.0–3.0)
Eosinophils Absolute: 0.3 10*3/uL (ref 0.0–0.7)
Eosinophils Relative: 4.4 % (ref 0.0–5.0)
HCT: 39.8 % (ref 36.0–46.0)
Hemoglobin: 13.3 g/dL (ref 12.0–15.0)
Lymphocytes Relative: 36.8 % (ref 12.0–46.0)
Lymphs Abs: 2.3 10*3/uL (ref 0.7–4.0)
MCHC: 33.3 g/dL (ref 30.0–36.0)
MCV: 83.1 fl (ref 78.0–100.0)
Monocytes Absolute: 0.6 10*3/uL (ref 0.1–1.0)
Monocytes Relative: 9 % (ref 3.0–12.0)
Neutro Abs: 3.1 10*3/uL (ref 1.4–7.7)
Neutrophils Relative %: 48.6 % (ref 43.0–77.0)
Platelets: 212 10*3/uL (ref 150.0–400.0)
RBC: 4.79 Mil/uL (ref 3.87–5.11)
RDW: 14.2 % (ref 11.5–15.5)
WBC: 6.3 10*3/uL (ref 4.0–10.5)

## 2023-03-01 LAB — HEPATIC FUNCTION PANEL
ALT: 28 U/L (ref 0–35)
AST: 22 U/L (ref 0–37)
Albumin: 4.7 g/dL (ref 3.5–5.2)
Alkaline Phosphatase: 73 U/L (ref 39–117)
Bilirubin, Direct: 0.1 mg/dL (ref 0.0–0.3)
Total Bilirubin: 0.5 mg/dL (ref 0.2–1.2)
Total Protein: 7.4 g/dL (ref 6.0–8.3)

## 2023-03-01 LAB — MICROALBUMIN / CREATININE URINE RATIO
Creatinine,U: 86.8 mg/dL
Microalb Creat Ratio: 0.8 mg/g (ref 0.0–30.0)
Microalb, Ur: <0.7 mg/dL (ref 0.0–1.9)

## 2023-03-01 LAB — BASIC METABOLIC PANEL
BUN: 17 mg/dL (ref 6–23)
CO2: 26 mEq/L (ref 19–32)
Calcium: 9.7 mg/dL (ref 8.4–10.5)
Chloride: 101 mEq/L (ref 96–112)
Creatinine, Ser: 0.91 mg/dL (ref 0.40–1.20)
GFR: 65.5 mL/min (ref >60.00)
Glucose, Bld: 122 mg/dL — ABNORMAL HIGH (ref 70–99)
Potassium: 3.8 mEq/L (ref 3.5–5.1)
Sodium: 137 mEq/L (ref 135–145)

## 2023-03-01 LAB — URINALYSIS, ROUTINE W REFLEX MICROSCOPIC
Bilirubin Urine: NEGATIVE
Hgb urine dipstick: NEGATIVE
Ketones, ur: NEGATIVE
Leukocytes,Ua: NEGATIVE
Nitrite: NEGATIVE
RBC / HPF: NONE SEEN (ref 0–?)
Specific Gravity, Urine: 1.015 (ref 1.000–1.030)
Total Protein, Urine: NEGATIVE
Urine Glucose: NEGATIVE
Urobilinogen, UA: 0.2 (ref 0.0–1.0)
pH: 6 (ref 5.0–8.0)

## 2023-03-01 LAB — LIPID PANEL
Cholesterol: 277 mg/dL — ABNORMAL HIGH (ref 0–200)
HDL: 44.1 mg/dL (ref >39.00)
NonHDL: 233.39
Total CHOL/HDL Ratio: 6
Triglycerides: 307 mg/dL — ABNORMAL HIGH (ref 0.0–149.0)
VLDL: 61.4 mg/dL — ABNORMAL HIGH (ref 0.0–40.0)

## 2023-03-01 LAB — LDL CHOLESTEROL, DIRECT: Direct LDL: 188 mg/dL

## 2023-03-01 LAB — HEMOGLOBIN A1C: Hgb A1c MFr Bld: 6.4 % (ref 4.6–6.5)

## 2023-03-01 LAB — VITAMIN D 25 HYDROXY (VIT D DEFICIENCY, FRACTURES): VITD: 67.96 ng/mL (ref 30.00–100.00)

## 2023-03-01 LAB — TSH: TSH: 1.7 u[IU]/mL (ref 0.35–5.50)

## 2023-03-01 LAB — VITAMIN B12: Vitamin B-12: 303 pg/mL (ref 211–911)

## 2023-03-03 ENCOUNTER — Ambulatory Visit: Payer: 59 | Admitting: Internal Medicine

## 2023-03-03 VITALS — BP 122/68 | HR 79 | Temp 98.8°F | Ht 61.0 in | Wt 168.0 lb

## 2023-03-03 DIAGNOSIS — Z0001 Encounter for general adult medical examination with abnormal findings: Secondary | ICD-10-CM

## 2023-03-03 DIAGNOSIS — I10 Essential (primary) hypertension: Secondary | ICD-10-CM

## 2023-03-03 DIAGNOSIS — Z7985 Long-term (current) use of injectable non-insulin antidiabetic drugs: Secondary | ICD-10-CM

## 2023-03-03 DIAGNOSIS — E7800 Pure hypercholesterolemia, unspecified: Secondary | ICD-10-CM

## 2023-03-03 DIAGNOSIS — E1165 Type 2 diabetes mellitus with hyperglycemia: Secondary | ICD-10-CM | POA: Diagnosis not present

## 2023-03-03 DIAGNOSIS — F411 Generalized anxiety disorder: Secondary | ICD-10-CM

## 2023-03-03 MED ORDER — VALSARTAN-HYDROCHLOROTHIAZIDE 320-25 MG PO TABS: 1.0000 | ORAL_TABLET | Freq: Every day | ORAL | 3 refills | Status: DC

## 2023-03-03 MED ORDER — REPATHA SURECLICK 140 MG/ML ~~LOC~~ SOAJ: 140.0000 mg | SUBCUTANEOUS | 3 refills | Status: DC

## 2023-03-03 MED ORDER — POTASSIUM CHLORIDE CRYS ER 20 MEQ PO TBCR: 20.0000 meq | EXTENDED_RELEASE_TABLET | Freq: Two times a day (BID) | ORAL | 3 refills | Status: DC

## 2023-03-03 NOTE — Progress Notes (Signed)
Patient ID: [name] [name], female   DOB: 1956/07/07, 67 y.o.   MRN: [id number]         Chief Complaint:: wellness exam and hld, dm, htn, anxiety       HPI:  [name] [name] is a 67 y.o. female here for wellness exam; plans to call for eye exam soon, declines covid booster, o/w up to date                        Also has been statin intolerant in past, willing to try repatha.  Not sure if she wants to start mounjaro for dm and obesity yet, plans to let us know.  Pt denies chest pain, increased sob or doe, wheezing, orthopnea, PND, increased LE swelling, palpitations, dizziness or syncope.   Pt denies polydipsia, polyuria, or new focal neuro s/s.    Pt denies fever, wt loss, night sweats, loss of appetite, or other constitutional symptoms  Denies worsening depressive symptoms, suicidal ideation, or panic   Wt Readings from Last 3 Encounters:  03/03/23 168 lb (76.2 kg)  09/02/22 162 lb (73.5 kg)  09/01/22 162 lb (73.5 kg)   BP Readings from Last 3 Encounters:  03/03/23 122/68  09/02/22 118/80  09/01/22 122/80   Immunization History  Administered Date(s) Administered   Influenza Split 09/06/2016   Influenza Whole 06/20/2009, 06/20/2010   Influenza, High Dose Seasonal PF 12/09/2016, 11/30/2018, 11/29/2019, 11/25/2020, 11/26/2021   Influenza,inj,Quad PF,6+ Mos 08/14/2019   Influenza,inj,Quad PF,6-35 Mos 07/04/2017   Influenza-Unspecified 05/21/2012, 05/21/2014, 06/25/2015, 07/15/2017, 06/22/2022   PFIZER(Purple Top)SARS-COV-2 Vaccination 11/01/2019, 11/22/2019, 08/23/2020   Pneumococcal Conjugate-13 10/05/2017   Pneumococcal Polysaccharide-23 08/28/2014, 09/12/2015, 12/09/2016, 11/30/2018, 11/29/2019, 11/25/2020, 11/26/2021   Td 03/20/1996, 09/15/2009   Tdap 05/06/2020   Zoster Recombinat (Shingrix) 12/15/2020, 04/03/2021   Health Maintenance Due  Topic Date Due   OPHTHALMOLOGY EXAM  02/11/2023      Past Medical History:  Diagnosis Date   ALLERGIC RHINITIS 04/06/2007   ANXIETY  04/06/2007   Arthritis    Cataract    CIN I (cervical intraepithelial neoplasia I)    COLONIC POLYPS 08/04/2007   Diabetes mellitus without complication (HCC)    DIARRHEA, ACUTE 09/23/2010   Family history of adverse reaction to anesthesia    Mother and brother hard to wake up after anesthesia   Fibroid    GASTRIC POLYP 08/04/2007   GERD 08/04/2007   GOITER, MULTINODULAR 03/10/2009   Headache(784.0) 11/13/2008   Hx of blood clots    HYPERLIPIDEMIA 04/06/2007   HYPERTENSION 04/06/2007   ISCHEMIC COLITIS, HX OF 07/06/2007   Lichen sclerosus 2014   Osteopenia 08/2018   T score -1.1 FRAX 7% / 0.5%   Palpitations 03/10/2009   Renal mass    Left kidney   Rosacea 11/30/2007   THYROID NODULE, LEFT 11/04/2010   Urinary frequency    UTI'S, RECURRENT 11/30/2007   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 pectoral/subclavicular abscess drainage  09/20/2004   removed skull cyst  09/21/1971   right foot neuroma  09/20/1986   ROBOTIC ASSITED PARTIAL NEPHRECTOMY Left 04/16/2022   Procedure: XI ROBOTIC ASSITED PARTIAL NEPHRECTOMY;  Surgeon: Rene Paci, MD;  Location: [facility];  Service: Urology;  Laterality: Left;   uterine fibroids removed  09/20/1994    reports that she has never smoked. She has never used smokeless tobacco. She reports current alcohol use. She reports that she does not use drugs. family history includes Breast cancer in her paternal aunt; Deep vein thrombosis in her  brother; Diabetes in her mother; Emphysema in her father; Heart attack in her brother; Heart disease in her father and mother; Hypertension in her mother; Lung cancer in her maternal grandmother, maternal uncle, and mother; Macular degeneration in her brother; Stroke in her brother and father. Allergies  Allergen Reactions   Avelox [Moxifloxacin] Diarrhea   Cefuroxime Axetil Diarrhea   Ciprofloxacin Diarrhea   Crab Extract Diarrhea and Nausea And Vomiting    ONLY CRAB ALLERGY (no other shellfish allergies)   Crestor [Rosuvastatin] Other (See Comments)    myalgias   Lipitor [Atorvastatin Calcium] Other (See Comments)    Sensitive (extreme muscle aches)   Latex Rash    Band-aids if left on too long   Current Outpatient Medications on File Prior to Visit  Medication Sig Dispense Refill   azelastine (OPTIVAR) 0.05 % ophthalmic solution Place 1 drop into both eyes daily.     Blood Glucose Monitoring Suppl (CONTOUR BLOOD GLUCOSE SYSTEM) w/Device KIT Use to check blood sugar once daily E11.9 1 kit 0   diphenhydrAMINE (BENADRYL ALLERGY) 25 MG tablet Take 25 mg by mouth as needed for allergies.     glucose blood (CONTOUR TEST) test strip Use to check blood sugar once daily e11.9 300 each 3   guaiFENesin (MUCINEX) 600 MG 12 hr tablet Take 600 mg by mouth in the morning.     Lancets (ONETOUCH ULTRASOFT) lancets Use to check blood sugar once daily 300 each 3   levocetirizine (XYZAL) 5 MG tablet Take 5 mg by mouth every evening.     Menthol, Topical Analgesic, (BIOFREEZE EX) Apply 1 Application topically 3 (three) times daily as needed (muscle pain/aches.).     omeprazole (PRILOSEC) 20 MG capsule Take 20 mg by mouth daily as needed (indigestion/heartburn.).     No current facility-administered medications on file prior to visit.        ROS:  All others reviewed and negative.  Objective        PE:  BP 122/68 (BP Location: Left Arm, Patient Position: Sitting, Cuff Size: Normal)   Pulse 79   Temp  98.8 F (37.1 C) (Oral)   Ht 5\' 1"  (1.549 m)   Wt 168 lb (76.2 kg)   SpO2 97%   BMI 31.74 kg/m                 Constitutional: Pt appears in NAD               HENT: Head: NCAT.                Right Ear: External ear normal.                 Left Ear: External ear normal.                Eyes: . Pupils are equal, round, and reactive to light. Conjunctivae and EOM are normal  Nose: without d/c or deformity               Neck: Neck supple. Gross normal ROM               Cardiovascular: Normal rate and regular rhythm.                 Pulmonary/Chest: Effort normal and breath sounds without rales or wheezing.                Abd:  Soft, NT, ND, + BS, no organomegaly               Neurological: Pt is alert. At baseline orientation, motor grossly intact               Skin: Skin is warm. No rashes, no other new lesions, LE edema - none               Psychiatric: Pt behavior is normal without agitation   Micro: none  Cardiac tracings I have personally interpreted today:  none  Pertinent Radiological findings (summarize): none   Lab Results  Component Value Date   WBC 6.3 03/01/2023   HGB 13.3 03/01/2023   HCT 39.8 03/01/2023   PLT 212.0 03/01/2023   GLUCOSE 122 (H) 03/01/2023   CHOL 277 (H) 03/01/2023   TRIG 307.0 (H) 03/01/2023   HDL 44.10 03/01/2023   LDLDIRECT 188.0 03/01/2023   LDLCALC 92 02/18/2022   ALT 28 03/01/2023   AST 22 03/01/2023   NA 137 03/01/2023   K 3.8 03/01/2023   CL 101 03/01/2023   CREATININE 0.91 03/01/2023   BUN 17 03/01/2023   CO2 26 03/01/2023   TSH 1.70 03/01/2023   HGBA1C 6.4 03/01/2023   MICROALBUR <0.7 03/01/2023   Assessment/Plan:  [name] [name] is a 67 y.o. White or Caucasian [1] female with  has a past medical history of ALLERGIC RHINITIS (04/06/2007), ANXIETY (04/06/2007), Arthritis, Cataract, CIN I (cervical intraepithelial neoplasia I), COLONIC POLYPS (08/04/2007), Diabetes mellitus without complication (HCC), DIARRHEA, ACUTE  (09/23/2010), Family history of adverse reaction to anesthesia, Fibroid, GASTRIC POLYP (08/04/2007), GERD (08/04/2007), GOITER, MULTINODULAR (03/10/2009), Headache(784.0) (11/13/2008), blood clots, HYPERLIPIDEMIA (04/06/2007), HYPERTENSION (04/06/2007), ISCHEMIC COLITIS, HX OF (07/06/2007), Lichen sclerosus (2014), Osteopenia (08/2018), Palpitations (03/10/2009), Renal mass, Rosacea (11/30/2007), THYROID NODULE, LEFT (11/04/2010), Urinary frequency, UTI'S, RECURRENT (11/30/2007), and 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  Encounter for well adult exam with abnormal findings Age and sex appropriate education and counseling updated with regular exercise and diet Referrals for preventative services - pt to call for eye exam soon Immunizations addressed - declines covid booster Smoking counseling  - none needed Evidence for depression or other mood disorder - none significant Most recent labs reviewed. I have personally reviewed and have noted: 1) the patient's medical and social history 2) The patient's current medications and supplements 3) The patient's height, weight, and BMI have been recorded in the chart   Hyperlipidemia Lab Results  Component Value Date   LDLCALC 92 02/18/2022   Uncontrolled, goal ldl < 70, has been statin intolerant, now for repatha 140 mg q 2 wks   ANXIETY Stable overall, declines need for ssri today  Essential hypertension BP Readings from Last 3 Encounters:  03/03/23 122/68  09/02/22 118/80  09/01/22 122/80   Stable, pt to continue medical treatment diovan hct 320-25 qd   Diabetes Lab Results  Component Value Date   HGBA1C 6.4 03/01/2023   Stable in the setting of obesity,  pt to continue current medical treatment  - diet, wt control and consider mounjaro 2.5 mg weekly   Followup: Return in about 6 months (around 09/02/2023).  Oliver Barre, MD 03/05/2023 1:21 PM [facility]  Primary Care - [facility]

## 2023-03-03 NOTE — Patient Instructions (Signed)
Please call if you would like to start the [facility] or ozempic  Please take all new medication as prescribed  - the Repatha shot every 2 wks  Please continue all other medications as before, and refills have been done if requested.  Please have the pharmacy call with any other refills you may need.  Please continue your efforts at being more active, low cholesterol diet, and weight control.  You are otherwise up to date with prevention measures today.  Please keep your appointments with your specialists as you may have planned  Please make an Appointment to return in 6 months, or sooner if needed, also with Lab Appointment for testing done 3-5 days before at the FIRST FLOOR Lab (so this is for TWO appointments - please see the scheduling desk as you leave)

## 2023-03-04 ENCOUNTER — Telehealth: Payer: Self-pay | Admitting: Pharmacy Technician

## 2023-03-04 NOTE — Telephone Encounter (Signed)
Pharmacy Patient Advocate Encounter   Received notification from CoverMyMeds that prior authorization for Repatha SureClick 140MG /ML auto-injectors is required/requested.     PA submitted to [facility] via PA options: CoverMyMeds Key/confirmation #/EOC [id number] Status is pending

## 2023-03-05 ENCOUNTER — Encounter: Payer: Self-pay | Admitting: Internal Medicine

## 2023-03-05 NOTE — Assessment & Plan Note (Addendum)
Lab Results  Component Value Date   HGBA1C 6.4 03/01/2023   Stable in the setting of obesity, pt to continue current medical treatment  - diet, wt control and consider mounjaro 2.5 mg weekly

## 2023-03-05 NOTE — Assessment & Plan Note (Signed)
Age and sex appropriate education and counseling updated with regular exercise and diet Referrals for preventative services - pt to call for eye exam soon Immunizations addressed - declines covid booster Smoking counseling  - none needed Evidence for depression or other mood disorder - none significant Most recent labs reviewed. I have personally reviewed and have noted: 1) the patient's medical and social history 2) The patient's current medications and supplements 3) The patient's height, weight, and BMI have been recorded in the chart  

## 2023-03-05 NOTE — Assessment & Plan Note (Signed)
Lab Results  Component Value Date   LDLCALC 92 02/18/2022   Uncontrolled, goal ldl < 70, has been statin intolerant, now for repatha 140 mg q 2 wks

## 2023-03-05 NOTE — Assessment & Plan Note (Signed)
BP Readings from Last 3 Encounters:  03/03/23 122/68  09/02/22 118/80  09/01/22 122/80   Stable, pt to continue medical treatment diovan hct 320-25 qd

## 2023-03-05 NOTE — Assessment & Plan Note (Signed)
Stable overall, declines need for ssri today

## 2023-03-07 NOTE — Telephone Encounter (Signed)
The denial was based on our criteria for Repatha Sure Inj 140mg /Ml.  Per your health plan's criteria, this drug is covered if you meet the following: (1) One of the following: (A) You took ezetimibe therapy for at least 12 weeks in a row. (B) You cannot take ezetimibe. (2) One of the following: (A) You have one of the following cholesterol values [low-density lipoprotein cholesterol (LDL-C)] while on maximally tolerated statin therapy within the last 120 days: (I) LDL-C is more than or equal to 55mg /dL with the cholesterol disorder (atherosclerotic cardiovascular disease - plaque builds up inside the arteries of your heart). (II) LDL-C is more than or equal to 100mg /dL without the cholesterol disorder (atherosclerotic cardiovascular disease - plaque builds up inside the arteries of your heart). (B) Both of the following: (I) You have been taking proprotein convertase subtilisin/kexin type 9 (PCSK9) inhibitor therapy in addition to maximally tolerated lipid lowering therapy (for example: statins, ezetimibe). (II) Low-density lipoprotein cholesterol (LDL-C) values drawn within the past 12 months while on maximally tolerated lipid lowering therapy is within normal limits

## 2023-03-14 ENCOUNTER — Telehealth: Payer: Self-pay | Admitting: Internal Medicine

## 2023-03-14 NOTE — Telephone Encounter (Signed)
Patient's prior authorization was denied for  Evolocumab (REPATHA SURECLICK) 140 MG/ML SOAJ . She wanted to know if someone can help her with filing an appeal. Phone number for [facility] appeals is [phone]. She would like a call back at [phone].

## 2023-03-30 ENCOUNTER — Telehealth: Payer: Self-pay | Admitting: Internal Medicine

## 2023-03-30 MED ORDER — EZETIMIBE 10 MG PO TABS: 10.0000 mg | ORAL_TABLET | Freq: Every day | ORAL | 3 refills | Status: DC

## 2023-03-30 NOTE — Telephone Encounter (Signed)
Ok this is done erx 

## 2023-03-30 NOTE — Telephone Encounter (Signed)
[facility] told patient that she will need to try Zetia before she can try Repatha.  Please call the Zetia into [facility].

## 2023-05-31 ENCOUNTER — Ambulatory Visit: Payer: 59 | Admitting: Family Medicine

## 2023-05-31 ENCOUNTER — Other Ambulatory Visit: Payer: Self-pay

## 2023-05-31 ENCOUNTER — Encounter: Payer: Self-pay | Admitting: Family Medicine

## 2023-05-31 ENCOUNTER — Other Ambulatory Visit: Payer: Self-pay | Admitting: Internal Medicine

## 2023-05-31 VITALS — BP 126/76 | HR 69 | Temp 97.6°F | Ht 61.0 in | Wt 168.0 lb

## 2023-05-31 DIAGNOSIS — N3001 Acute cystitis with hematuria: Secondary | ICD-10-CM | POA: Diagnosis not present

## 2023-05-31 DIAGNOSIS — H00012 Hordeolum externum right lower eyelid: Secondary | ICD-10-CM

## 2023-05-31 DIAGNOSIS — R300 Dysuria: Secondary | ICD-10-CM

## 2023-05-31 DIAGNOSIS — L298 Other pruritus: Secondary | ICD-10-CM

## 2023-05-31 LAB — POC URINALSYSI DIPSTICK (AUTOMATED)
Bilirubin, UA: NEGATIVE
Blood, UA: POSITIVE
Glucose, UA: NEGATIVE
Ketones, UA: NEGATIVE
Nitrite, UA: NEGATIVE
Protein, UA: NEGATIVE
Spec Grav, UA: 1.02 (ref 1.010–1.025)
Urobilinogen, UA: 0.2 U/dL
pH, UA: 6 (ref 5.0–8.0)

## 2023-05-31 MED ORDER — SULFAMETHOXAZOLE-TRIMETHOPRIM 800-160 MG PO TABS: 1.0000 | ORAL_TABLET | Freq: Two times a day (BID) | ORAL | 0 refills | Status: DC

## 2023-05-31 NOTE — Progress Notes (Signed)
Subjective:  [name] [name] is a 67 y.o. female who complains of possible urinary tract infection.  She has had symptoms for 4 days.  Symptoms include  urinary frequency, urgency, dysuria, diarrhea . Patient denies  fever, chills, abdominal pain, vomiting, back pain .  Last UTI was over a year ago.   Using nothing for current symptoms.    Hx of renal cancer with partial nephrectomy 03/2022.   Currently has a stye on right lower eyelid. She saw her eye doctor and prescribed Doxycycline which she has not started. Using eye drops.   She also c/o a 2 week hx of pruritic rash on her arms 2 weeks. She has used triamcinolone with no impromement. She was taking a supplement prior to onset of rash and has stopped it.   Past Medical History:  Diagnosis Date   ALLERGIC RHINITIS 04/06/2007   ANXIETY 04/06/2007   Arthritis    Cataract    CIN I (cervical intraepithelial neoplasia I)    COLONIC POLYPS 08/04/2007   Diabetes mellitus without complication (HCC)    DIARRHEA, ACUTE 09/23/2010   Family history of adverse reaction to anesthesia    Mother and brother hard to wake up after anesthesia   Fibroid    GASTRIC POLYP 08/04/2007   GERD 08/04/2007   GOITER, MULTINODULAR 03/10/2009   Headache(784.0) 11/13/2008   Hx of blood clots    HYPERLIPIDEMIA 04/06/2007   HYPERTENSION 04/06/2007   ISCHEMIC COLITIS, HX OF 07/06/2007   Lichen sclerosus 2014   Osteopenia 08/2018   T score -1.1 FRAX 7% / 0.5%   Palpitations 03/10/2009   Renal mass    Left kidney   Rosacea 11/30/2007   THYROID NODULE, LEFT 11/04/2010   Urinary frequency    UTI'S, RECURRENT 11/30/2007   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    Objective: Vitals:   05/31/23 0828  BP: 126/76  Pulse: 69  Temp: 97.6 F (36.4 C)  SpO2: 95%    General appearance: alert, no distress, WD/WN, female Abdomen:  soft, non tender, non distended Back: no CVA tenderness Skin: raised, red rash on bilateral posterior arms from hand to mid upper arm consistent with short sleeve shirt line.        Laboratory:  Urine dipstick: sp gravity 1.020, 1+ for hemoglobin, 3+ for leukocyte esterase, and negative for nitrites.       Assessment: Acute cystitis with hematuria - Plan: Urine Culture, Urine Culture, sulfamethoxazole-trimethoprim (BACTRIM DS) 800-160 MG tablet  Dysuria - Plan: POCT Urinalysis Dipstick (Automated)  Pruritic erythematous rash  Hordeolum externum of right lower eyelid   Plan: Discussed symptoms, diagnosis, possible complications, and usual course of illness.  Bactrim prescribed for style and acute cystitis.  She will hold off on Doxycycline prescribed by eye doctor.  Advised increased water intake, can use OTC Tylenol for pain.    Advised Xyzal, cool compresses for itching. Warm compresses on eyelid.     Urine culture sent.   Call or return if worse or not improving.  Follow up with dermatologist if rash is not improving.

## 2023-05-31 NOTE — Patient Instructions (Signed)
Stay hydrated. Take the antibiotic as prescribed.   Continue Xyzal. Stop triamcinolone after using it for 2 weeks.   Use cool compresses.   Follow up with your dermatologist if the rash is not improving.

## 2023-06-03 LAB — URINE CULTURE

## 2023-06-15 ENCOUNTER — Ambulatory Visit: Payer: 59 | Admitting: Internal Medicine

## 2023-06-15 VITALS — BP 128/68 | HR 75 | Temp 98.7°F | Ht 61.0 in | Wt 170.2 lb

## 2023-06-15 DIAGNOSIS — M25552 Pain in left hip: Secondary | ICD-10-CM

## 2023-06-15 DIAGNOSIS — I10 Essential (primary) hypertension: Secondary | ICD-10-CM

## 2023-06-15 DIAGNOSIS — N3001 Acute cystitis with hematuria: Secondary | ICD-10-CM

## 2023-06-15 DIAGNOSIS — Z87820 Personal history of traumatic brain injury: Secondary | ICD-10-CM | POA: Diagnosis not present

## 2023-06-15 DIAGNOSIS — S80212D Abrasion, left knee, subsequent encounter: Secondary | ICD-10-CM

## 2023-06-15 NOTE — Patient Instructions (Addendum)
Please continue all other medications as before  Please have the pharmacy call with any other refills you may need.  Please continue your efforts at being more active, low cholesterol diet, and weight control.  You are otherwise up to date with prevention measures today.  Please keep your appointments with your specialists as you may have planned  You should be called soon regarding the Head CT - asap  Please go to the LAB at the blood drawing area for the tests to be done - just the urine testing today  Please make an Appointment to return in dec 13, or sooner if needed

## 2023-06-15 NOTE — Progress Notes (Signed)
Patient ID: [name] [name], female   DOB: 1955-10-21, 67 y.o.   MRN: [id number]        Chief Complaint: follow up UTI, right lower lid stye, fall with closed head trauma, left knee and hip pain, allergic rash       HPI:  [name] [name] is a 67 y.o. female here overall doing ok today, but has dysuria x 3 days with Denies urinary symptoms such as frequency, urgency, flank pain, hematuria or n/v, fever, chills.  Also unfortunately with fall several days ago accidental striking left side mostly to left knee with abrasion, left hip with bruising,a ndleft head at about the area of prior surgury remotely and has cranial defect, pt c/o persistent HA, but no loc or confusion, but does have marked pressure to the head with bending at the waist.  Recent rash has resolved.  Right mid lower stye about 50% improved.         Wt Readings from Last 3 Encounters:  06/15/23 170 lb 3.2 oz (77.2 kg)  05/31/23 168 lb (76.2 kg)  03/03/23 168 lb (76.2 kg)   BP Readings from Last 3 Encounters:  06/15/23 128/68  05/31/23 126/76  03/03/23 122/68         Past Medical History:  Diagnosis Date   ALLERGIC RHINITIS 04/06/2007   ANXIETY 04/06/2007   Arthritis    Cataract    CIN I (cervical intraepithelial neoplasia I)    COLONIC POLYPS 08/04/2007   Diabetes mellitus without complication (HCC)    DIARRHEA, ACUTE 09/23/2010   Family history of adverse reaction to anesthesia    Mother and brother hard to wake up after anesthesia   Fibroid    GASTRIC POLYP 08/04/2007   GERD 08/04/2007   GOITER, MULTINODULAR 03/10/2009   Headache(784.0) 11/13/2008   Hx of blood clots    HYPERLIPIDEMIA 04/06/2007   HYPERTENSION 04/06/2007   ISCHEMIC COLITIS, HX OF 07/06/2007   Lichen sclerosus 2014   Osteopenia 08/2018   T score -1.1 FRAX 7% / 0.5%   Palpitations 03/10/2009   Renal mass    Left kidney   Rosacea 11/30/2007   THYROID NODULE, LEFT 11/04/2010   Urinary frequency    UTI'S, RECURRENT 11/30/2007   Varicose  veins of lower extremity with nontruncal reflux    Past Surgical History:  Procedure Laterality Date   APPENDECTOMY  09/21/2003   BIOPSY THYROID     x2   bladder tack     COLPOSCOPY     CYSTOSCOPY  09/20/2005   DERMOID CYST  EXCISION  09/20/1992   right ovary   DILATATION & CURETTAGE/HYSTEROSCOPY WITH MYOSURE N/A 03/22/2022   Procedure: DILATATION & CURETTAGE/HYSTEROSCOPY WITH MYOSURE;  Surgeon: Romualdo Bolk, MD;  Location: [facility];  Service: Gynecology;  Laterality: N/A;   FLEXIBLE SIGMOIDOSCOPY  11/16/1995   Lt. pectoral/subclavicular abscess drainage  09/20/2004   removed skull cyst  09/21/1971   right foot neuroma  09/20/1986   ROBOTIC ASSITED PARTIAL NEPHRECTOMY Left 04/16/2022   Procedure: XI ROBOTIC ASSITED PARTIAL NEPHRECTOMY;  Surgeon: Rene Paci, MD;  Location: [facility];  Service: Urology;  Laterality: Left;   uterine fibroids removed  09/20/1994    reports that she has never smoked. She has never used smokeless tobacco. She reports current alcohol use. She reports that she does not use drugs. family history includes Breast cancer in her paternal aunt; Deep vein thrombosis in her brother; Diabetes in her mother; Emphysema in her father; Heart attack in her  brother; Heart disease in her father and mother; Hypertension in her mother; Lung cancer in her maternal grandmother, maternal uncle, and mother; Macular degeneration in her brother; Stroke in her brother and father. Allergies  Allergen Reactions   Avelox [Moxifloxacin] Diarrhea   Cefuroxime Axetil Diarrhea   Ciprofloxacin Diarrhea   Crab Extract Diarrhea and Nausea And Vomiting    ONLY CRAB ALLERGY (no other shellfish allergies)   Crestor [Rosuvastatin] Other (See Comments)    myalgias   Lipitor [Atorvastatin Calcium] Other (See Comments)    Sensitive (extreme muscle aches)   Latex Rash    Band-aids if left on too long   Current Outpatient Medications on File Prior to Visit  Medication Sig Dispense  Refill   Blood Glucose Monitoring Suppl (CONTOUR BLOOD GLUCOSE SYSTEM) w/Device KIT Use to check blood sugar once daily E11.9 1 kit 0   CONTOUR NEXT TEST test strip USE TO CHECK BLOOD SUGAR ONCE  DAILY 100 strip 3   diphenhydrAMINE (BENADRYL ALLERGY) 25 MG tablet Take 25 mg by mouth as needed for allergies.     ezetimibe (ZETIA) 10 MG tablet Take 1 tablet (10 mg total) by mouth daily. 90 tablet 3   guaiFENesin (MUCINEX) 600 MG 12 hr tablet Take 600 mg by mouth in the morning.     Microlet Lancets MISC CHECK BLOOD SUGAR ONCE DAILY 100 each 3   Omega-3 Fatty Acids (FISH OIL OMEGA-3 PO) Take by mouth.     omeprazole (PRILOSEC) 20 MG capsule Take 20 mg by mouth daily as needed (indigestion/heartburn.).     potassium chloride SA (KLOR-CON M) 20 MEQ tablet Take 1 tablet (20 mEq total) by mouth 2 (two) times daily. 180 tablet 3   valsartan-hydrochlorothiazide (DIOVAN-HCT) 320-25 MG tablet Take 1 tablet by mouth daily. 90 tablet 3   levocetirizine (XYZAL) 5 MG tablet Take 5 mg by mouth every evening. (Patient not taking: Reported on 06/15/2023)     Menthol, Topical Analgesic, (BIOFREEZE EX) Apply 1 Application topically 3 (three) times daily as needed (muscle pain/aches.). (Patient not taking: Reported on 06/15/2023)     prednisoLONE acetate (PRED FORTE) 1 % ophthalmic suspension Place into the left eye. (Patient not taking: Reported on 06/15/2023)     sulfamethoxazole-trimethoprim (BACTRIM DS) 800-160 MG tablet Take 1 tablet by mouth 2 (two) times daily. (Patient not taking: Reported on 06/15/2023) 14 tablet 0   UNABLE TO FIND Med Name: Instaflex Joint Support GNC (Patient not taking: Reported on 05/31/2023)     No current facility-administered medications on file prior to visit.        ROS:  All others reviewed and negative.  Objective        PE:  BP 128/68 (BP Location: Left Arm, Patient Position: Sitting, Cuff Size: Normal)   Pulse 75   Temp 98.7 F (37.1 C) (Oral)   Ht 5\' 1"  (1.549 m)   Wt 170  lb 3.2 oz (77.2 kg)   SpO2 95%   BMI 32.16 kg/m                 Constitutional: Pt appears in NAD               HENT: Head: NCAT.                Right Ear: External ear normal.                 Left Ear: External ear normal.  Eyes: . Pupils are equal, round, and reactive to light. Conjunctivae and EOM are normal               Nose: without d/c or deformity               Neck: Neck supple. Gross normal ROM               Cardiovascular: Normal rate and regular rhythm.                 Pulmonary/Chest: Effort normal and breath sounds without rales or wheezing.                Abd:  Soft, NT, ND, + BS, no organomegaly               Neurological: Pt is alert. At baseline orientation, motor grossly intact               Skin: Skin is warm. No rashes, LE edema - none, left knee abrasion scabbed without cellulitis               Psychiatric: Pt behavior is normal without agitation   Micro: none  Cardiac tracings I have personally interpreted today:  none  Pertinent Radiological findings (summarize): none   Lab Results  Component Value Date   WBC 6.3 03/01/2023   HGB 13.3 03/01/2023   HCT 39.8 03/01/2023   PLT 212.0 03/01/2023   GLUCOSE 122 (H) 03/01/2023   CHOL 277 (H) 03/01/2023   TRIG 307.0 (H) 03/01/2023   HDL 44.10 03/01/2023   LDLDIRECT 188.0 03/01/2023   LDLCALC 92 02/18/2022   ALT 28 03/01/2023   AST 22 03/01/2023   NA 137 03/01/2023   K 3.8 03/01/2023   CL 101 03/01/2023   CREATININE 0.91 03/01/2023   BUN 17 03/01/2023   CO2 26 03/01/2023   TSH 1.70 03/01/2023   HGBA1C 6.4 03/01/2023   MICROALBUR <0.7 03/01/2023   Assessment/Plan:  [name] [name] is a 67 y.o. White or Caucasian [1] female with  has a past medical history of ALLERGIC RHINITIS (04/06/2007), ANXIETY (04/06/2007), Arthritis, Cataract, CIN I (cervical intraepithelial neoplasia I), COLONIC POLYPS (08/04/2007), Diabetes mellitus without complication (HCC), DIARRHEA, ACUTE (09/23/2010), Family  history of adverse reaction to anesthesia, Fibroid, GASTRIC POLYP (08/04/2007), GERD (08/04/2007), GOITER, MULTINODULAR (03/10/2009), Headache(784.0) (11/13/2008), blood clots, HYPERLIPIDEMIA (04/06/2007), HYPERTENSION (04/06/2007), ISCHEMIC COLITIS, HX OF (07/06/2007), Lichen sclerosus (2014), Osteopenia (08/2018), Palpitations (03/10/2009), Renal mass, Rosacea (11/30/2007), THYROID NODULE, LEFT (11/04/2010), Urinary frequency, UTI'S, RECURRENT (11/30/2007), and 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  UTI'S, RECURRENT With new worsening dysuria, for ua and cx  Significant closed head trauma within past 3 months With recent fall, persistent HA, hx of cyst removal surgury and cranial defect - for head ct asap  Left hip pain S/p fall with bruising, ambulates well, to see sport med for persistent or worsening  Abrasion of left knee Mild to mod, healing well, no s/s infection, pt reassured  Essential hypertension BP Readings from Last 3 Encounters:  06/15/23 128/68  05/31/23 126/76  03/03/23 122/68   Stable, pt to continue medical treatment diovan hct 320 25 qd   Followup: Return in about 3 months (around 09/02/2023).  Oliver Barre, MD 06/18/2023 4:55 PM [facility] - [facility]

## 2023-06-16 ENCOUNTER — Ambulatory Visit
Admission: RE | Admit: 2023-06-16 | Discharge: 2023-06-16 | Disposition: A | Discharge status: Home / Self Care | Payer: 59 | Source: Ambulatory Visit | Attending: Internal Medicine | Admitting: Internal Medicine

## 2023-06-16 DIAGNOSIS — Z87820 Personal history of traumatic brain injury: Secondary | ICD-10-CM

## 2023-06-16 LAB — URINALYSIS, ROUTINE W REFLEX MICROSCOPIC
Bilirubin Urine: NEGATIVE
Hgb urine dipstick: NEGATIVE
Ketones, ur: NEGATIVE
Leukocytes,Ua: NEGATIVE
Nitrite: NEGATIVE
RBC / HPF: NONE SEEN (ref 0–?)
Specific Gravity, Urine: <=1.005 — AB (ref 1.000–1.030)
Total Protein, Urine: NEGATIVE
Urine Glucose: NEGATIVE
Urobilinogen, UA: 0.2 (ref 0.0–1.0)
WBC, UA: NONE SEEN (ref 0–?)
pH: 6 (ref 5.0–8.0)

## 2023-06-16 LAB — URINE CULTURE: Result:: NO GROWTH

## 2023-06-18 ENCOUNTER — Encounter: Payer: Self-pay | Admitting: Internal Medicine

## 2023-06-18 DIAGNOSIS — S80212A Abrasion, left knee, initial encounter: Secondary | ICD-10-CM | POA: Insufficient documentation

## 2023-06-18 DIAGNOSIS — M25552 Pain in left hip: Secondary | ICD-10-CM | POA: Insufficient documentation

## 2023-06-18 DIAGNOSIS — Z87820 Personal history of traumatic brain injury: Secondary | ICD-10-CM | POA: Insufficient documentation

## 2023-06-18 NOTE — Assessment & Plan Note (Signed)
With recent fall, persistent HA, hx of cyst removal surgury and cranial defect - for head ct asap

## 2023-06-18 NOTE — Assessment & Plan Note (Signed)
S/p fall with bruising, ambulates well, to see sport med for persistent or worsening

## 2023-06-18 NOTE — Assessment & Plan Note (Signed)
Mild to mod, healing well, no s/s infection, pt reassured

## 2023-06-18 NOTE — Assessment & Plan Note (Signed)
BP Readings from Last 3 Encounters:  06/15/23 128/68  05/31/23 126/76  03/03/23 122/68   Stable, pt to continue medical treatment diovan hct 320 25 qd

## 2023-06-18 NOTE — Assessment & Plan Note (Signed)
With new worsening dysuria, for ua and cx

## 2023-06-28 ENCOUNTER — Telehealth: Payer: Self-pay | Admitting: Internal Medicine

## 2023-06-28 DIAGNOSIS — R300 Dysuria: Secondary | ICD-10-CM

## 2023-06-28 NOTE — Telephone Encounter (Signed)
Pt called because she is suffering with a uti or symptoms of a uti. Pt states that its bad and very painful.  She was just seen by you not to long ago for the same situation. Pt was wondering if she could get a urine sample ordered to see whats going on, some type of meds for the pain or if she needs to come back and see you. Please advise thanks.

## 2023-06-28 NOTE — Telephone Encounter (Signed)
Ok for urine and culture - orders done

## 2023-06-29 ENCOUNTER — Other Ambulatory Visit: Payer: Self-pay | Admitting: Internal Medicine

## 2023-06-29 ENCOUNTER — Other Ambulatory Visit (INDEPENDENT_AMBULATORY_CARE_PROVIDER_SITE_OTHER): Payer: 59

## 2023-06-29 DIAGNOSIS — R300 Dysuria: Secondary | ICD-10-CM

## 2023-06-29 LAB — URINALYSIS, ROUTINE W REFLEX MICROSCOPIC
Bacteria, UA: NONE SEEN
Bilirubin Urine: NEGATIVE
Hgb urine dipstick: NEGATIVE
Ketones, ur: NEGATIVE
Nitrite: NEGATIVE
RBC / HPF: NONE SEEN (ref 0–?)
Specific Gravity, Urine: 1.01 (ref 1.000–1.030)
Total Protein, Urine: NEGATIVE
Urine Glucose: NEGATIVE
Urobilinogen, UA: 0.2 (ref 0.0–1.0)
pH: 7 (ref 5.0–8.0)

## 2023-06-29 MED ORDER — NITROFURANTOIN MACROCRYSTAL 50 MG PO CAPS: 50.0000 mg | ORAL_CAPSULE | Freq: Four times a day (QID) | ORAL | 0 refills | Status: DC

## 2023-06-29 NOTE — Telephone Encounter (Signed)
Called and let Pt know

## 2023-06-30 LAB — URINE CULTURE: Result:: NO GROWTH

## 2023-07-27 ENCOUNTER — Encounter: Payer: Self-pay | Admitting: Internal Medicine

## 2023-07-27 ENCOUNTER — Other Ambulatory Visit: Payer: Self-pay | Admitting: Internal Medicine

## 2023-07-27 ENCOUNTER — Ambulatory Visit: Payer: 59 | Admitting: Internal Medicine

## 2023-07-27 VITALS — BP 122/76 | HR 71 | Temp 98.8°F | Ht 61.0 in | Wt 168.0 lb

## 2023-07-27 DIAGNOSIS — E7800 Pure hypercholesterolemia, unspecified: Secondary | ICD-10-CM

## 2023-07-27 DIAGNOSIS — I10 Essential (primary) hypertension: Secondary | ICD-10-CM | POA: Diagnosis not present

## 2023-07-27 DIAGNOSIS — N390 Urinary tract infection, site not specified: Secondary | ICD-10-CM | POA: Diagnosis not present

## 2023-07-27 DIAGNOSIS — R350 Frequency of micturition: Secondary | ICD-10-CM | POA: Diagnosis not present

## 2023-07-27 DIAGNOSIS — R300 Dysuria: Secondary | ICD-10-CM

## 2023-07-27 DIAGNOSIS — E1165 Type 2 diabetes mellitus with hyperglycemia: Secondary | ICD-10-CM | POA: Diagnosis not present

## 2023-07-27 LAB — URINALYSIS, ROUTINE W REFLEX MICROSCOPIC
Bilirubin Urine: NEGATIVE
Hgb urine dipstick: NEGATIVE
Ketones, ur: NEGATIVE
Nitrite: NEGATIVE
RBC / HPF: NONE SEEN (ref 0–?)
Specific Gravity, Urine: 1.01 (ref 1.000–1.030)
Total Protein, Urine: NEGATIVE
Urine Glucose: NEGATIVE
Urobilinogen, UA: 0.2 (ref 0.0–1.0)
pH: 6 (ref 5.0–8.0)

## 2023-07-27 MED ORDER — SULFAMETHOXAZOLE-TRIMETHOPRIM 800-160 MG PO TABS: 1.0000 | ORAL_TABLET | Freq: Two times a day (BID) | ORAL | 0 refills | Status: DC

## 2023-07-27 MED ORDER — TRIMETHOPRIM 100 MG PO TABS: ORAL_TABLET | ORAL | 3 refills | Status: DC

## 2023-07-27 MED ORDER — GEMTESA 75 MG PO TABS: ORAL_TABLET | ORAL | 3 refills | Status: AC

## 2023-07-27 NOTE — Patient Instructions (Signed)
Ok to check the urine studies today  Please start the Gemtesa if the culture is negative and the med is affordable  We should think about preventive trimethoprim antibiotic if the culture is positive and after tx for that  Please continue all other medications as before, and refills have been done if requested.  Please have the pharmacy call with any other refills you may need.  Please continue your efforts at being more active, low cholesterol diet, and weight control.  Please keep your appointments with your specialists as you may have planned - urology next month

## 2023-07-27 NOTE — Progress Notes (Unsigned)
Patient ID: EMALIE MCWETHY, female   DOB: 02-28-1956, 67 y.o.   MRN: [id number]        Chief Complaint: follow up dysuria, urinary frequency, recurrent uti, hld, htn, hld       HPI:  KATERIA CUTRONA is a 67 y.o. female here with 3 days onset dysuria; and over 1-2 months urinary freuquency, and recurrent uti.  Denies urinary symptoms such as urgency, flank pain, hematuria or n/v, fever, chills.  Pt denies chest pain, increased sob or doe, wheezing, orthopnea, PND, increased LE swelling, palpitations, dizziness or syncope.   Pt denies polydipsia, polyuria, or new focal neuro s/s.    Pt denies fever, wt loss, night sweats, loss of appetite, or other constitutional symptoms  Has had difficulty tolerating more the 4 times per wk statin low dose crestor 5 mg.   Has urology f/u planned next month.         Wt Readings from Last 3 Encounters:  07/27/23 168 lb (76.2 kg)  06/15/23 170 lb 3.2 oz (77.2 kg)  05/31/23 168 lb (76.2 kg)   BP Readings from Last 3 Encounters:  07/27/23 122/76  06/15/23 128/68  05/31/23 126/76         Past Medical History:  Diagnosis Date   ALLERGIC RHINITIS 04/06/2007   ANXIETY 04/06/2007   Arthritis    Cataract    CIN I (cervical intraepithelial neoplasia I)    COLONIC POLYPS 08/04/2007   Diabetes mellitus without complication (HCC)    DIARRHEA, ACUTE 09/23/2010   Family history of adverse reaction to anesthesia    Mother and brother hard to wake up after anesthesia   Fibroid    GASTRIC POLYP 08/04/2007   GERD 08/04/2007   GOITER, MULTINODULAR 03/10/2009   Headache(784.0) 11/13/2008   Hx of blood clots    HYPERLIPIDEMIA 04/06/2007   HYPERTENSION 04/06/2007   ISCHEMIC COLITIS, HX OF 07/06/2007   Lichen sclerosus 2014   Osteopenia 08/2018   T score -1.1 FRAX 7% / 0.5%   Palpitations 03/10/2009   Renal mass    Left kidney   Rosacea 11/30/2007   THYROID NODULE, LEFT 11/04/2010   Urinary frequency    UTI'S, RECURRENT 11/30/2007   Varicose veins of lower  extremity with nontruncal reflux    Past Surgical History:  Procedure Laterality Date   APPENDECTOMY  09/21/2003   BIOPSY THYROID     x2   bladder tack     COLPOSCOPY     CYSTOSCOPY  09/20/2005   DERMOID CYST  EXCISION  09/20/1992   right ovary   DILATATION & CURETTAGE/HYSTEROSCOPY WITH MYOSURE N/A 03/22/2022   Procedure: DILATATION & CURETTAGE/HYSTEROSCOPY WITH MYOSURE;  Surgeon: Romualdo Bolk, MD;  Location: [facility];  Service: Gynecology;  Laterality: N/A;   FLEXIBLE SIGMOIDOSCOPY  11/16/1995   Lt. pectoral/subclavicular abscess drainage  09/20/2004   removed skull cyst  09/21/1971   right foot neuroma  09/20/1986   ROBOTIC ASSITED PARTIAL NEPHRECTOMY Left 04/16/2022   Procedure: XI ROBOTIC ASSITED PARTIAL NEPHRECTOMY;  Surgeon: Rene Paci, MD;  Location: [facility];  Service: Urology;  Laterality: Left;   uterine fibroids removed  09/20/1994    reports that she has never smoked. She has never used smokeless tobacco. She reports current alcohol use. She reports that she does not use drugs. family history includes Breast cancer in her paternal aunt; Deep vein thrombosis in her brother; Diabetes in her mother; Emphysema in her father; Heart attack in her brother; Heart disease in her  father and mother; Hypertension in her mother; Lung cancer in her maternal grandmother, maternal uncle, and mother; Macular degeneration in her brother; Stroke in her brother and father. Allergies  Allergen Reactions   Avelox [Moxifloxacin] Diarrhea   Cefuroxime Axetil Diarrhea   Ciprofloxacin Diarrhea   Crab Extract Diarrhea and Nausea And Vomiting    ONLY CRAB ALLERGY (no other shellfish allergies)   Crestor [Rosuvastatin] Other (See Comments)    myalgias   Lipitor [Atorvastatin Calcium] Other (See Comments)    Sensitive (extreme muscle aches)   Latex Rash    Band-aids if left on too long   Current Outpatient Medications on File Prior to Visit  Medication Sig Dispense Refill    Blood Glucose Monitoring Suppl (CONTOUR BLOOD GLUCOSE SYSTEM) w/Device KIT Use to check blood sugar once daily E11.9 1 kit 0   CONTOUR NEXT TEST test strip USE TO CHECK BLOOD SUGAR ONCE  DAILY 100 strip 3   diphenhydrAMINE (BENADRYL ALLERGY) 25 MG tablet Take 25 mg by mouth as needed for allergies.     guaiFENesin (MUCINEX) 600 MG 12 hr tablet Take 600 mg by mouth in the morning.     Microlet Lancets MISC CHECK BLOOD SUGAR ONCE DAILY 100 each 3   nitrofurantoin (MACRODANTIN) 50 MG capsule Take 1 capsule (50 mg total) by mouth 4 (four) times daily. 28 capsule 0   Omega-3 Fatty Acids (FISH OIL OMEGA-3 PO) Take by mouth.     omeprazole (PRILOSEC) 20 MG capsule Take 20 mg by mouth daily as needed (indigestion/heartburn.).     potassium chloride SA (KLOR-CON M) 20 MEQ tablet Take 1 tablet (20 mEq total) by mouth 2 (two) times daily. 180 tablet 3   valsartan-hydrochlorothiazide (DIOVAN-HCT) 320-25 MG tablet Take 1 tablet by mouth daily. 90 tablet 3   ezetimibe (ZETIA) 10 MG tablet Take 1 tablet (10 mg total) by mouth daily. (Patient not taking: Reported on 07/27/2023) 90 tablet 3   levocetirizine (XYZAL) 5 MG tablet Take 5 mg by mouth every evening. (Patient not taking: Reported on 06/15/2023)     Menthol, Topical Analgesic, (BIOFREEZE EX) Apply 1 Application topically 3 (three) times daily as needed (muscle pain/aches.). (Patient not taking: Reported on 06/15/2023)     prednisoLONE acetate (PRED FORTE) 1 % ophthalmic suspension Place into the left eye. (Patient not taking: Reported on 06/15/2023)     UNABLE TO FIND Med Name: Instaflex Joint Support GNC (Patient not taking: Reported on 05/31/2023)     No current facility-administered medications on file prior to visit.        ROS:  All others reviewed and negative.  Objective        PE:  BP 122/76 (BP Location: Right Arm, Patient Position: Sitting, Cuff Size: Normal)   Pulse 71   Temp 98.8 F (37.1 C) (Oral)   Ht 5\' 1"  (1.549 m)   Wt 168 lb (76.2  kg)   SpO2 97%   BMI 31.74 kg/m                 Constitutional: Pt appears in NAD               HENT: Head: NCAT.                Right Ear: External ear normal.                 Left Ear: External ear normal.  Eyes: . Pupils are equal, round, and reactive to light. Conjunctivae and EOM are normal               Nose: without d/c or deformity               Neck: Neck supple. Gross normal ROM               Cardiovascular: Normal rate and regular rhythm.                 Pulmonary/Chest: Effort normal and breath sounds without rales or wheezing.                Abd:  Soft, NT, ND, + BS, no organomegaly               Neurological: Pt is alert. At baseline orientation, motor grossly intact               Skin: Skin is warm. No rashes, no other new lesions, LE edema - none               Psychiatric: Pt behavior is normal without agitation   Micro: none  Cardiac tracings I have personally interpreted today:  none  Pertinent Radiological findings (summarize): none   Lab Results  Component Value Date   WBC 6.3 03/01/2023   HGB 13.3 03/01/2023   HCT 39.8 03/01/2023   PLT 212.0 03/01/2023   GLUCOSE 122 (H) 03/01/2023   CHOL 277 (H) 03/01/2023   TRIG 307.0 (H) 03/01/2023   HDL 44.10 03/01/2023   LDLDIRECT 188.0 03/01/2023   LDLCALC 92 02/18/2022   ALT 28 03/01/2023   AST 22 03/01/2023   NA 137 03/01/2023   K 3.8 03/01/2023   CL 101 03/01/2023   CREATININE 0.91 03/01/2023   BUN 17 03/01/2023   CO2 26 03/01/2023   TSH 1.70 03/01/2023   HGBA1C 6.4 03/01/2023   MICROALBUR <0.7 03/01/2023   Assessment/Plan:  LETTY SALVI is a 67 y.o. White or Caucasian [1] female with  has a past medical history of ALLERGIC RHINITIS (04/06/2007), ANXIETY (04/06/2007), Arthritis, Cataract, CIN I (cervical intraepithelial neoplasia I), COLONIC POLYPS (08/04/2007), Diabetes mellitus without complication (HCC), DIARRHEA, ACUTE (09/23/2010), Family history of adverse reaction to anesthesia,  Fibroid, GASTRIC POLYP (08/04/2007), GERD (08/04/2007), GOITER, MULTINODULAR (03/10/2009), Headache(784.0) (11/13/2008), blood clots, HYPERLIPIDEMIA (04/06/2007), HYPERTENSION (04/06/2007), ISCHEMIC COLITIS, HX OF (07/06/2007), Lichen sclerosus (2014), Osteopenia (08/2018), Palpitations (03/10/2009), Renal mass, Rosacea (11/30/2007), THYROID NODULE, LEFT (11/04/2010), Urinary frequency, UTI'S, RECURRENT (11/30/2007), and 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  UTI'S, RECURRENT Also consider add trimethoprim for recurrent UTI if infection proven today  Urinary frequency Can't r/o OAB, consider add Gemtesa 75 every day if culture is negative  Hyperlipidemia Lab Results  Component Value Date   LDLCALC 92 02/18/2022   Stable, pt to continue current statin zetia 10 every day, consider repatha vs nexlizet if statin not tolerable in future   Dysuria For ua and culture today,  to f/u any worsening symptoms,  to f/u any worsening symptoms or concerns  Essential hypertension BP Readings from Last 3 Encounters:  07/27/23 122/76  06/15/23 128/68  05/31/23 126/76   Stable, pt to continue medical treatment diovan hct 320 25 qd   Diabetes Lab Results  Component Value Date   HGBA1C 6.4 03/01/2023   Stable, pt to continue current medical treatment  - diet, wt control   Followup: Return if symptoms worsen or fail to improve.  Oliver Barre,  MD 07/28/2023 8:33 PM [facility]  Primary Care - [facility] Internal Medicine

## 2023-07-28 ENCOUNTER — Encounter: Payer: Self-pay | Admitting: Internal Medicine

## 2023-07-28 DIAGNOSIS — R300 Dysuria: Secondary | ICD-10-CM | POA: Insufficient documentation

## 2023-07-28 NOTE — Assessment & Plan Note (Signed)
Also consider add trimethoprim for recurrent UTI if infection proven today

## 2023-07-28 NOTE — Assessment & Plan Note (Signed)
Lab Results  Component Value Date   LDLCALC 92 02/18/2022   Stable, pt to continue current statin zetia 10 every day, consider repatha vs nexlizet if statin not tolerable in future

## 2023-07-28 NOTE — Assessment & Plan Note (Signed)
Lab Results  Component Value Date   HGBA1C 6.4 03/01/2023   Stable, pt to continue current medical treatment  - diet, wt control

## 2023-07-28 NOTE — Assessment & Plan Note (Signed)
BP Readings from Last 3 Encounters:  07/27/23 122/76  06/15/23 128/68  05/31/23 126/76   Stable, pt to continue medical treatment diovan hct 320 25 qd

## 2023-07-28 NOTE — Assessment & Plan Note (Signed)
Can't r/o OAB, consider add Gemtesa 75 every day if culture is negative

## 2023-07-28 NOTE — Assessment & Plan Note (Signed)
For ua and culture today,  to f/u any worsening symptoms,  to f/u any worsening symptoms or concerns

## 2023-08-02 LAB — LIPOPROTEIN A (LPA): Lipoprotein (a): <10 nmol/L (ref <75)

## 2023-08-02 LAB — URINE CULTURE

## 2023-08-24 ENCOUNTER — Telehealth: Payer: Self-pay | Admitting: *Deleted

## 2023-08-24 DIAGNOSIS — N950 Postmenopausal bleeding: Secondary | ICD-10-CM

## 2023-08-24 DIAGNOSIS — M8580 Other specified disorders of bone density and structure, unspecified site: Secondary | ICD-10-CM

## 2023-08-24 DIAGNOSIS — Z780 Asymptomatic menopausal state: Secondary | ICD-10-CM

## 2023-08-24 NOTE — Telephone Encounter (Signed)
Patient left message requesting BMD order to [facility].   Last AEX 09/02/22 -[facility] BMD:   09/06/18  osteopenia, T score -1.1, f/u 5 years   Next AEX 10/26/23- BS  Order pended  Routing to Dr. Edward Jolly

## 2023-08-24 NOTE — Telephone Encounter (Signed)
Patient left message requesting order for BMD to [facility].

## 2023-08-24 NOTE — Telephone Encounter (Signed)
I agree with the bone density testing.

## 2023-08-25 NOTE — Telephone Encounter (Signed)
Patient notified. Patient to call and schedule.   Encounter closed.

## 2023-08-29 ENCOUNTER — Ambulatory Visit (HOSPITAL_COMMUNITY)
Admission: RE | Admit: 2023-08-29 | Discharge: 2023-08-29 | Disposition: A | Discharge status: Home / Self Care | Payer: 59 | Source: Ambulatory Visit | Attending: Urology | Admitting: Urology

## 2023-08-29 ENCOUNTER — Other Ambulatory Visit (HOSPITAL_COMMUNITY): Payer: Self-pay | Admitting: Urology

## 2023-08-29 DIAGNOSIS — C642 Malignant neoplasm of left kidney, except renal pelvis: Secondary | ICD-10-CM

## 2023-08-30 LAB — LAB REPORT - SCANNED: EGFR (Non-African Amer.): 76.3

## 2023-09-01 ENCOUNTER — Other Ambulatory Visit: Payer: 59

## 2023-09-01 ENCOUNTER — Telehealth: Payer: Self-pay | Admitting: Internal Medicine

## 2023-09-01 DIAGNOSIS — E1165 Type 2 diabetes mellitus with hyperglycemia: Secondary | ICD-10-CM

## 2023-09-01 DIAGNOSIS — E538 Deficiency of other specified B group vitamins: Secondary | ICD-10-CM

## 2023-09-01 DIAGNOSIS — E559 Vitamin D deficiency, unspecified: Secondary | ICD-10-CM

## 2023-09-01 NOTE — Telephone Encounter (Signed)
Patient would like to know if she can have her labs done prior to her appointment on 09/12/23. Best callback is [phone]

## 2023-09-01 NOTE — Telephone Encounter (Signed)
Ok labs are ordered 

## 2023-09-02 ENCOUNTER — Ambulatory Visit: Payer: 59 | Admitting: Internal Medicine

## 2023-09-02 NOTE — Telephone Encounter (Signed)
Called and let Pt know

## 2023-09-07 ENCOUNTER — Ambulatory Visit: Payer: 59 | Admitting: Obstetrics and Gynecology

## 2023-09-07 ENCOUNTER — Other Ambulatory Visit (INDEPENDENT_AMBULATORY_CARE_PROVIDER_SITE_OTHER): Payer: 59

## 2023-09-07 ENCOUNTER — Telehealth: Payer: Self-pay

## 2023-09-07 DIAGNOSIS — E1165 Type 2 diabetes mellitus with hyperglycemia: Secondary | ICD-10-CM

## 2023-09-07 DIAGNOSIS — E559 Vitamin D deficiency, unspecified: Secondary | ICD-10-CM | POA: Diagnosis not present

## 2023-09-07 DIAGNOSIS — E538 Deficiency of other specified B group vitamins: Secondary | ICD-10-CM | POA: Diagnosis not present

## 2023-09-07 LAB — CBC WITH DIFFERENTIAL/PLATELET
Basophils Absolute: 0.1 10*3/uL (ref 0.0–0.1)
Basophils Relative: 1 % (ref 0.0–3.0)
Eosinophils Absolute: 0.2 10*3/uL (ref 0.0–0.7)
Eosinophils Relative: 3.3 % (ref 0.0–5.0)
HCT: 36.2 % (ref 36.0–46.0)
Hemoglobin: 12.2 g/dL (ref 12.0–15.0)
Lymphocytes Relative: 31.8 % (ref 12.0–46.0)
Lymphs Abs: 2.2 10*3/uL (ref 0.7–4.0)
MCHC: 33.6 g/dL (ref 30.0–36.0)
MCV: 83.7 fL (ref 78.0–100.0)
Monocytes Absolute: 0.6 10*3/uL (ref 0.1–1.0)
Monocytes Relative: 8.9 % (ref 3.0–12.0)
Neutro Abs: 3.8 10*3/uL (ref 1.4–7.7)
Neutrophils Relative %: 55 % (ref 43.0–77.0)
Platelets: 230 10*3/uL (ref 150.0–400.0)
RBC: 4.33 Mil/uL (ref 3.87–5.11)
RDW: 13.5 % (ref 11.5–15.5)
WBC: 7 10*3/uL (ref 4.0–10.5)

## 2023-09-07 LAB — URINALYSIS, ROUTINE W REFLEX MICROSCOPIC
Bilirubin Urine: NEGATIVE
Hgb urine dipstick: NEGATIVE
Ketones, ur: NEGATIVE
Leukocytes,Ua: NEGATIVE
Nitrite: NEGATIVE
RBC / HPF: NONE SEEN (ref 0–?)
Specific Gravity, Urine: <=1.005 — AB (ref 1.000–1.030)
Total Protein, Urine: NEGATIVE
Urine Glucose: NEGATIVE
Urobilinogen, UA: 0.2 (ref 0.0–1.0)
pH: 6.5 (ref 5.0–8.0)

## 2023-09-07 LAB — MICROALBUMIN / CREATININE URINE RATIO
Creatinine,U: 47.7 mg/dL
Microalb Creat Ratio: 1.5 mg/g (ref 0.0–30.0)
Microalb, Ur: <0.7 mg/dL (ref 0.0–1.9)

## 2023-09-07 LAB — LIPID PANEL
Cholesterol: 118 mg/dL (ref 0–200)
HDL: 43.8 mg/dL (ref >39.00)
LDL Cholesterol: 49 mg/dL (ref 0–99)
NonHDL: 74.25
Total CHOL/HDL Ratio: 3
Triglycerides: 128 mg/dL (ref 0.0–149.0)
VLDL: 25.6 mg/dL (ref 0.0–40.0)

## 2023-09-07 LAB — BASIC METABOLIC PANEL
BUN: 17 mg/dL (ref 6–23)
CO2: 28 meq/L (ref 19–32)
Calcium: 9.3 mg/dL (ref 8.4–10.5)
Chloride: 98 meq/L (ref 96–112)
Creatinine, Ser: 0.76 mg/dL (ref 0.40–1.20)
GFR: 81.01 mL/min (ref >60.00)
Glucose, Bld: 108 mg/dL — ABNORMAL HIGH (ref 70–99)
Potassium: 3.7 meq/L (ref 3.5–5.1)
Sodium: 134 meq/L — ABNORMAL LOW (ref 135–145)

## 2023-09-07 LAB — HEPATIC FUNCTION PANEL
ALT: 28 U/L (ref 0–35)
AST: 27 U/L (ref 0–37)
Albumin: 4.5 g/dL (ref 3.5–5.2)
Alkaline Phosphatase: 69 U/L (ref 39–117)
Bilirubin, Direct: 0.1 mg/dL (ref 0.0–0.3)
Total Bilirubin: 0.5 mg/dL (ref 0.2–1.2)
Total Protein: 6.9 g/dL (ref 6.0–8.3)

## 2023-09-07 LAB — VITAMIN B12: Vitamin B-12: 576 pg/mL (ref 211–911)

## 2023-09-07 LAB — HEMOGLOBIN A1C: Hgb A1c MFr Bld: 6.7 % — ABNORMAL HIGH (ref 4.6–6.5)

## 2023-09-07 LAB — VITAMIN D 25 HYDROXY (VIT D DEFICIENCY, FRACTURES): VITD: 102.59 ng/mL (ref 30.00–100.00)

## 2023-09-07 LAB — TSH: TSH: 1.17 u[IU]/mL (ref 0.35–5.50)

## 2023-09-07 NOTE — Progress Notes (Signed)
The test results show that your current treatment is OK, as the tests are stable.  Please continue the same plan.  There is no other need for change of treatment or further evaluation based on these results, at this time.  thanks 

## 2023-09-07 NOTE — Telephone Encounter (Signed)
CRITICAL VALUE STICKER  CRITICAL VALUE: Vitamin D102.59  RECEIVER (on-site recipient of call): Izeah Vossler Stubbs-Barnette  DATE & TIME NOTIFIED: 09/07/23  MESSENGER (representative from lab): [facility]  MD NOTIFIED: 2:40  TIME OF NOTIFICATION: 2:38

## 2023-09-07 NOTE — Telephone Encounter (Signed)
Ok to follow for now, thanks 

## 2023-09-08 ENCOUNTER — Encounter: Payer: Self-pay | Admitting: Licensed Clinical Social Worker

## 2023-09-08 ENCOUNTER — Inpatient Hospital Stay: Payer: 59 | Attending: Oncology | Admitting: Licensed Clinical Social Worker

## 2023-09-08 ENCOUNTER — Inpatient Hospital Stay: Payer: 59

## 2023-09-08 DIAGNOSIS — Z803 Family history of malignant neoplasm of breast: Secondary | ICD-10-CM

## 2023-09-08 DIAGNOSIS — Z85528 Personal history of other malignant neoplasm of kidney: Secondary | ICD-10-CM

## 2023-09-08 DIAGNOSIS — Z801 Family history of malignant neoplasm of trachea, bronchus and lung: Secondary | ICD-10-CM

## 2023-09-08 DIAGNOSIS — Z808 Family history of malignant neoplasm of other organs or systems: Secondary | ICD-10-CM

## 2023-09-08 DIAGNOSIS — Z8481 Family history of carrier of genetic disease: Secondary | ICD-10-CM

## 2023-09-08 NOTE — Progress Notes (Signed)
REFERRING PROVIDER: Self-referred  PRIMARY PROVIDER:  Corwin Levins, MD  PRIMARY REASON FOR VISIT:  1. Family history of gene mutation      HISTORY OF PRESENT ILLNESS:   [name] [name], a 67 y.o. 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  [name] [name] presents to clinic today to discuss the possibility of a hereditary predisposition to cancer, genetic testing, and to further clarify her future cancer risks, as well as potential cancer risks for family members.    CANCER HISTORY:  [name] [name] is a 67 y.o. female with history of kidney cancer (renal cell carcinoma) diagnosed in 2023. This was treated with partial left nephrectomy.  RISK FACTORS:  Menarche was at age 78-13.  Ovaries intact: has L ovary.  Hysterectomy: no.  Menopausal status: postmenopausal.  Colonoscopy: yes; normal. Mammogram within the last year: yes.   Past Medical History:  Diagnosis Date   ALLERGIC RHINITIS 04/06/2007   ANXIETY 04/06/2007   Arthritis    Cataract    CIN I (cervical intraepithelial neoplasia I)    COLONIC POLYPS 08/04/2007   Diabetes mellitus without complication (HCC)    DIARRHEA, ACUTE 09/23/2010   Family history of adverse reaction to anesthesia    Mother and brother hard to wake up after anesthesia   Fibroid    GASTRIC POLYP 08/04/2007   GERD 08/04/2007   GOITER, MULTINODULAR 03/10/2009   Headache(784.0) 11/13/2008   Hx of blood clots    HYPERLIPIDEMIA 04/06/2007   HYPERTENSION 04/06/2007   ISCHEMIC COLITIS, HX OF 07/06/2007   Lichen sclerosus 2014   Osteopenia 08/2018   T score -1.1 FRAX 7% / 0.5%   Palpitations 03/10/2009   Renal mass    Left kidney   Rosacea 11/30/2007   THYROID NODULE, LEFT 11/04/2010   Urinary frequency    UTI'S, RECURRENT 11/30/2007   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 pectoral/subclavicular abscess drainage  09/20/2004   removed skull cyst  09/21/1971   right foot neuroma  09/20/1986   ROBOTIC ASSITED PARTIAL NEPHRECTOMY Left 04/16/2022   Procedure: XI ROBOTIC ASSITED PARTIAL NEPHRECTOMY;  Surgeon: Rene Paci, MD;  Location: [facility];  Service: Urology;  Laterality: Left;   uterine fibroids removed  09/20/1994    FAMILY HISTORY:  We obtained a detailed, 4-generation family history.  Significant diagnoses are listed below: Family History  Problem Relation Age of Onset   Diabetes Mother    Hypertension Mother    Heart disease Mother    Lung cancer Mother    Heart disease Father    Stroke Father    Emphysema Father    Stroke Brother    Deep vein thrombosis Brother    Macular degeneration Brother    Heart attack Brother    Lung cancer Maternal Uncle  Breast cancer Paternal Aunt        Age 6's   Lung cancer Maternal Grandmother    Colon cancer Neg Hx    Stomach cancer Neg Hx    Rectal cancer Neg Hx    Esophageal cancer Neg Hx    Liver cancer Neg Hx    [name] [name] has 2 brothers, no cancers.   [name] [name] mother had lung cancer at 29 and passed at 67. Maternal uncle had lung cancer and passed at 49. His daughter, patient's cousin, was recently diagnosed with peritoneal cancer and found to have a pathogenic variant in BRIP1 called p.S624* (c.1871C>A). Patient's maternal grandmother had lung cancer and passed at 46.   [name] [name] father passed at  39. Two paternal aunts had breast cancer over age 32. No other known cancers on this side of the family, but limited information is known about all uncles/aunts/cousins.  [name] [name] is unaware of previous family history of genetic testing for hereditary cancer risks. There is no reported Ashkenazi Jewish ancestry. There is no known consanguinity.    GENETIC COUNSELING ASSESSMENT: [name] [name] is a 67 y.o. female with a family history of a BRIP1 pathogenic variant and personal history of kidney cancer. We, therefore, discussed and recommended the following at today's visit.   DISCUSSION: We discussed that approximately 10% of cancer is hereditary. We discussed the BRIP1 gene, noting a 5-15% lifetime risk for ovarian cancer. There are other genes associated with hereditary cancer as well. Cancers and risks are gene specific We discussed that testing is beneficial for several reasons including knowing about cancer risks, identifying potential screening and risk-reduction options that may be appropriate, and to understand if other family members could be at risk for cancer and allow them to undergo genetic testing.   We reviewed the characteristics, features and inheritance patterns of hereditary cancer syndromes. We also discussed genetic testing, including the appropriate family members to test, the process of testing, insurance coverage and turn-around-time for results. We discussed the implications of a negative, positive and/or variant of uncertain significant result. Given her personal and family history of cancer, we recommended [name] [name] pursue genetic testing for the [facility] CancerNext-Expanded+RNA gene panel.   Based on [name] [name] personal and family history of cancer, she meets medical criteria for genetic testing. Despite that she meets criteria, she may still have an out of pocket cost.   PLAN: After considering the risks, benefits, and limitations, [name] [name] provided informed consent to  pursue genetic testing and the blood sample was sent to [facility] for analysis of the CancerNext-Expanded+RNA panel. Results should be available within approximately 2-3 weeks' time, at which point they will be disclosed by telephone to [name] [name], as will any additional recommendations warranted by these results. [name] [name] will receive a summary of her genetic counseling visit and a copy of her results once available. This information will also be available in Epic.   [name] [name] questions were answered to her satisfaction today. Our contact information was provided should additional questions or concerns arise. Thank you for the referral and allowing [place] to share in the care of your patient.   Lacy Duverney, MS, [facility] Genetic Counselor [email or web address] Phone: [phone]  The patient was seen for a total of 25 minutes in face-to-face genetic counseling.  Dr. Blake Divine was available for discussion regarding this case.   _______________________________________________________________________ For Office Staff:  Number of people involved in session: 1 Was an Intern/ student involved with case: no

## 2023-09-12 ENCOUNTER — Ambulatory Visit: Payer: 59 | Admitting: Internal Medicine

## 2023-09-12 ENCOUNTER — Encounter: Payer: Self-pay | Admitting: Internal Medicine

## 2023-09-12 VITALS — BP 124/70 | HR 80 | Temp 98.7°F | Ht 61.0 in | Wt 171.0 lb

## 2023-09-12 DIAGNOSIS — J309 Allergic rhinitis, unspecified: Secondary | ICD-10-CM

## 2023-09-12 DIAGNOSIS — E1165 Type 2 diabetes mellitus with hyperglycemia: Secondary | ICD-10-CM | POA: Diagnosis not present

## 2023-09-12 DIAGNOSIS — R21 Rash and other nonspecific skin eruption: Secondary | ICD-10-CM | POA: Diagnosis not present

## 2023-09-12 DIAGNOSIS — I10 Essential (primary) hypertension: Secondary | ICD-10-CM | POA: Diagnosis not present

## 2023-09-12 DIAGNOSIS — E7800 Pure hypercholesterolemia, unspecified: Secondary | ICD-10-CM

## 2023-09-12 DIAGNOSIS — E559 Vitamin D deficiency, unspecified: Secondary | ICD-10-CM

## 2023-09-12 DIAGNOSIS — E538 Deficiency of other specified B group vitamins: Secondary | ICD-10-CM

## 2023-09-12 NOTE — Patient Instructions (Signed)
Please take all new medication as prescribed = the cream  Please continue all other medications as before, and refills have been done if requested.  Please have the pharmacy call with any other refills you may need.  Please continue your efforts at being more active, low cholesterol diet, and weight control.  Please keep your appointments with your specialists as you may have planned  Please make an Appointment to return in 6 months, or sooner if needed, also with Lab Appointment for testing done 3-5 days before at the FIRST FLOOR Lab (so this is for TWO appointments - please see the scheduling desk as you leave)

## 2023-09-12 NOTE — Progress Notes (Unsigned)
Patient ID: [name] [name], female   DOB: 04-04-1956, 67 y.o.   MRN: [id number]        Chief Complaint: follow up rash to bilateral forearms with itching, htn, dm, hld, allergies       HPI:  [name] [name] is a 67 y.o. female here with c/o sudden onset bilateral itchy red areas to both anterior forearms, similar but not as severe as previuos episode, without obvious contact allergen or other exposure.  Pt denies chest pain, increased sob or doe, wheezing, orthopnea, PND, increased LE swelling, palpitations, dizziness or syncope.   Pt denies polydipsia, polyuria, or new focal neuro s/s.    Pt denies fever, wt loss, night sweats, loss of appetite, or other constitutional symptoms  Does have several wks ongoing nasal allergy symptoms with clearish congestion, itch and sneezing, without fever, pain, ST, cough, swelling or wheezing.       Wt Readings from Last 3 Encounters:  09/12/23 171 lb (77.6 kg)  07/27/23 168 lb (76.2 kg)  06/15/23 170 lb 3.2 oz (77.2 kg)   BP Readings from Last 3 Encounters:  09/12/23 124/70  07/27/23 122/76  06/15/23 128/68         Past Medical History:  Diagnosis Date   ALLERGIC RHINITIS 04/06/2007   ANXIETY 04/06/2007   Arthritis    Cataract    CIN I (cervical intraepithelial neoplasia I)    COLONIC POLYPS 08/04/2007   Diabetes mellitus without complication (HCC)    DIARRHEA, ACUTE 09/23/2010   Family history of adverse reaction to anesthesia    Mother and brother hard to wake up after anesthesia   Fibroid    GASTRIC POLYP 08/04/2007   GERD 08/04/2007   GOITER, MULTINODULAR 03/10/2009   Headache(784.0) 11/13/2008   Hx of blood clots    HYPERLIPIDEMIA 04/06/2007   HYPERTENSION 04/06/2007   ISCHEMIC COLITIS, HX OF 07/06/2007   Lichen sclerosus 2014   Osteopenia 08/2018   T score -1.1 FRAX 7% / 0.5%   Palpitations 03/10/2009   Renal mass    Left kidney   Rosacea 11/30/2007   THYROID NODULE, LEFT 11/04/2010   Urinary frequency    UTI'S, RECURRENT  11/30/2007   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 pectoral/subclavicular abscess drainage  09/20/2004   removed skull cyst  09/21/1971   right foot neuroma  09/20/1986   ROBOTIC ASSITED PARTIAL NEPHRECTOMY Left 04/16/2022   Procedure: XI ROBOTIC ASSITED PARTIAL NEPHRECTOMY;  Surgeon: Rene Paci, MD;  [facility];  Service: Urology;  Laterality: Left;   uterine fibroids removed  09/20/1994    reports that she has never smoked. She has never used smokeless tobacco. She reports current alcohol use. She reports that she does not use drugs. family history includes Breast cancer in her paternal aunt and paternal aunt; Cancer in her cousin; Deep vein thrombosis in her brother; Diabetes in her mother; Emphysema in her father; Heart attack in her brother;  Heart disease in her father and mother; Hypertension in her mother; Lung cancer (age of onset: 11) in her maternal grandmother; Lung cancer (age of onset: 24) in her mother; Lung cancer (age of onset: 6) in her maternal uncle; Macular degeneration in her brother; Stroke in her brother and father. Allergies  Allergen Reactions   Avelox [Moxifloxacin] Diarrhea   Cefuroxime Axetil Diarrhea   Ciprofloxacin Diarrhea   Crab Extract Diarrhea and Nausea And Vomiting    ONLY CRAB ALLERGY (no other shellfish allergies)   Crestor [Rosuvastatin] Other (See Comments)    myalgias   Lipitor [Atorvastatin Calcium] Other (See Comments)    Sensitive  (extreme muscle aches)   Latex Rash    Band-aids if left on too long   Current Outpatient Medications on File Prior to Visit  Medication Sig Dispense Refill   Blood Glucose Monitoring Suppl (CONTOUR BLOOD GLUCOSE SYSTEM) w/Device KIT Use to check blood sugar once daily E11.9 1 kit 0   CONTOUR NEXT TEST test strip USE TO CHECK BLOOD SUGAR ONCE  DAILY 100 strip 3   guaiFENesin (MUCINEX) 600 MG 12 hr tablet Take 600 mg by mouth in the morning.     levocetirizine (XYZAL) 5 MG tablet Take 5 mg by mouth every evening.     Microlet Lancets MISC CHECK BLOOD SUGAR ONCE DAILY 100 each 3   potassium chloride SA (KLOR-CON M) 20 MEQ tablet Take 1 tablet (20 mEq total) by mouth 2 (two) times daily. 180 tablet 3   rosuvastatin (CRESTOR) 40 MG tablet Take 40 mg by mouth daily.     trimethoprim (TRIMPEX) 100 MG tablet 1 tab by mouth on Monday wed Friday 36 tablet 3   valsartan-hydrochlorothiazide (DIOVAN-HCT) 320-25 MG tablet Take 1 tablet by mouth daily. 90 tablet 3   Vibegron (GEMTESA) 75 MG TABS 1 tab by mouth once daily 90 tablet 3   diphenhydrAMINE (BENADRYL ALLERGY) 25 MG tablet Take 25 mg by mouth as needed for allergies. (Patient not taking: Reported on 09/12/2023)     ezetimibe (ZETIA) 10 MG tablet Take 1 tablet (10 mg total) by mouth daily. (Patient not taking: Reported on 09/12/2023) 90 tablet 3   Menthol, Topical Analgesic, (BIOFREEZE EX) Apply 1 Application topically 3 (three) times daily as needed (muscle pain/aches.). (Patient not taking: Reported on 06/15/2023)     nitrofurantoin (MACRODANTIN) 50 MG capsule Take 1 capsule (50 mg total) by mouth 4 (four) times daily. (Patient not taking: Reported on 09/12/2023) 28 capsule 0   Omega-3 Fatty Acids (FISH OIL OMEGA-3 PO) Take by mouth. (Patient not taking: Reported on 09/12/2023)     omeprazole (PRILOSEC) 20 MG capsule Take 20 mg by mouth daily as needed (indigestion/heartburn.). (Patient not taking: Reported on 09/12/2023)     prednisoLONE acetate  (PRED FORTE) 1 % ophthalmic suspension Place into the left eye. (Patient not taking: Reported on 09/12/2023)     sulfamethoxazole-trimethoprim (BACTRIM DS) 800-160 MG tablet Take 1 tablet by mouth 2 (two) times daily. (Patient not taking: Reported on 09/12/2023) 20 tablet 0   UNABLE TO FIND Med Name: Instaflex Joint Support GNC (Patient not taking: Reported on 09/12/2023)     No current facility-administered medications on file prior to visit.        ROS:  All others reviewed and negative.  Objective        PE:  BP 124/70 (BP Location: Right Arm, Patient Position: Sitting, Cuff Size: Normal)   Pulse 80   Temp 98.7 F (37.1 C) (Oral)  Ht 5\' 1"  (1.549 m)   Wt 171 lb (77.6 kg)   SpO2 95%   BMI 32.31 kg/m                 Constitutional: Pt appears in NAD               HENT: Head: NCAT.                Right Ear: External ear normal.                 Left Ear: External ear normal.                Eyes: . Pupils are equal, round, and reactive to light. Conjunctivae and EOM are normal               Nose: without d/c or deformity               Neck: Neck supple. Gross normal ROM               Cardiovascular: Normal rate and regular rhythm.                 Pulmonary/Chest: Effort normal and breath sounds without rales or wheezing.                               Neurological: Pt is alert. At baseline orientation, motor grossly intact               Skin: Skin is warm. LE edema - none; bilateral left forearms with multiple itchy erythem lesions without tender or drainage               Psychiatric: Pt behavior is normal without agitation   Micro: none  Cardiac tracings I have personally interpreted today:  none  Pertinent Radiological findings (summarize): none   Lab Results  Component Value Date   WBC 7.0 09/07/2023   HGB 12.2 09/07/2023   HCT 36.2 09/07/2023   PLT 230.0 09/07/2023   GLUCOSE 108 (H) 09/07/2023   CHOL 118 09/07/2023   TRIG 128.0 09/07/2023   HDL 43.80 09/07/2023    LDLDIRECT 188.0 03/01/2023   LDLCALC 49 09/07/2023   ALT 28 09/07/2023   AST 27 09/07/2023   NA 134 (L) 09/07/2023   K 3.7 09/07/2023   CL 98 09/07/2023   CREATININE 0.76 09/07/2023   BUN 17 09/07/2023   CO2 28 09/07/2023   TSH 1.17 09/07/2023   HGBA1C 6.7 (H) 09/07/2023   MICROALBUR <0.7 09/07/2023   Assessment/Plan:  [name] [name] is a 67 y.o. White or Caucasian [1] female with  has a past medical history of ALLERGIC RHINITIS (04/06/2007), ANXIETY (04/06/2007), Arthritis, Cataract, CIN I (cervical intraepithelial neoplasia I), COLONIC POLYPS (08/04/2007), Diabetes mellitus without complication (HCC), DIARRHEA, ACUTE (09/23/2010), Family history of adverse reaction to anesthesia, Fibroid, GASTRIC POLYP (08/04/2007), GERD (08/04/2007), GOITER, MULTINODULAR (03/10/2009), Headache(784.0) (11/13/2008), blood clots, HYPERLIPIDEMIA (04/06/2007), HYPERTENSION (04/06/2007), ISCHEMIC COLITIS, HX OF (07/06/2007), Lichen sclerosus (2014), Osteopenia (08/2018), Palpitations (03/10/2009), Renal mass, Rosacea (11/30/2007), THYROID NODULE, LEFT (11/04/2010), Urinary frequency, UTI'S, RECURRENT (11/30/2007), and 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  Hyperlipidemia Lab Results  Component Value Date   LDLCALC 49 09/07/2023   Stable, pt to continue current statin crestor 40 qd   Essential hypertension BP Readings from Last 3 Encounters:  09/12/23 124/70  07/27/23 122/76  06/15/23 128/68   Stable, pt  to continue medical treatment diovan hct 320 25  every day,    Allergic rhinitis Mild to mod, for restart xyzal 5 mg every day,  to f/u any worsening symptoms or concerns   Diabetes Lab Results  Component Value Date   HGBA1C 6.7 (H) 09/07/2023   Stable, pt to continue current medical treatment  - diet, wt control   Rash and nonspecific skin eruption Mild to mod, I suspect is contact dermatitis related but etiology unclear  for triam cr prn,,  to f/u any worsening  symptoms or concerns  Followup: Return in about 6 months (around 03/12/2024).  Oliver Barre, MD 09/15/2023 12:55 PM [facility]

## 2023-09-15 ENCOUNTER — Encounter: Payer: Self-pay | Admitting: Internal Medicine

## 2023-09-15 NOTE — Assessment & Plan Note (Signed)
Lab Results  Component Value Date   LDLCALC 49 09/07/2023   Stable, pt to continue current statin crestor 40 qd

## 2023-09-15 NOTE — Assessment & Plan Note (Signed)
Mild to mod, for restart xyzal 5 mg every day,  to f/u any worsening symptoms or concerns

## 2023-09-15 NOTE — Assessment & Plan Note (Signed)
Lab Results  Component Value Date   HGBA1C 6.7 (H) 09/07/2023   Stable, pt to continue current medical treatment  - diet, wt control

## 2023-09-15 NOTE — Assessment & Plan Note (Signed)
BP Readings from Last 3 Encounters:  09/12/23 124/70  07/27/23 122/76  06/15/23 128/68   Stable, pt to continue medical treatment diovan hct 320 25  every day,

## 2023-09-15 NOTE — Assessment & Plan Note (Signed)
Mild to mod, I suspect is contact dermatitis related but etiology unclear  for triam cr prn,,  to f/u any worsening symptoms or concerns

## 2023-09-20 ENCOUNTER — Other Ambulatory Visit: Payer: Self-pay | Admitting: Obstetrics and Gynecology

## 2023-09-20 DIAGNOSIS — Z1231 Encounter for screening mammogram for malignant neoplasm of breast: Secondary | ICD-10-CM

## 2023-09-22 ENCOUNTER — Telehealth: Payer: Self-pay

## 2023-09-22 MED ORDER — ROSUVASTATIN CALCIUM 40 MG PO TABS: 40.0000 mg | ORAL_TABLET | Freq: Every day | ORAL | 3 refills | Status: DC

## 2023-09-22 MED ORDER — TRIAMCINOLONE ACETONIDE 0.1 % EX CREA: 1.0000 | TOPICAL_CREAM | Freq: Two times a day (BID) | CUTANEOUS | 1 refills | Status: DC

## 2023-09-22 NOTE — Telephone Encounter (Signed)
 Copied from CRM [id number]. Topic: Clinical - Medication Question >> Sep 22, 2023  3:11 PM Susanna ORN wrote: Reason for CRM: Patient states she recently saw Dr. Norleen & wants to know if he will write her a prescription for the rosuvastatin  40 mg. States she would like to start back taking it. Previous doctor wrote last prescription for it. Pt also states that Dr. Norleen was supposed to prescribe her some cream for the rash that are on her arms, but her nor the pharmacy never received the prescription. Medications are to be sent to [facility].  Please give patient a call back in regards to this. CB #: [phone].

## 2023-09-22 NOTE — Telephone Encounter (Signed)
 OK for crestor to [facility] - done erx  I send the cream to local [facility] since this is not really a 90 day med- hope that is ok

## 2023-09-22 NOTE — Addendum Note (Signed)
 Addended by: Corwin Levins on: 09/22/2023 04:31 PM   Modules accepted: Orders

## 2023-09-23 NOTE — Telephone Encounter (Signed)
 Called and left voice mail

## 2023-09-26 ENCOUNTER — Ambulatory Visit (HOSPITAL_BASED_OUTPATIENT_CLINIC_OR_DEPARTMENT_OTHER)
Admission: RE | Admit: 2023-09-26 | Discharge: 2023-09-26 | Disposition: A | Discharge status: Home / Self Care | Payer: 59 | Source: Ambulatory Visit | Attending: Obstetrics and Gynecology | Admitting: Obstetrics and Gynecology

## 2023-09-26 DIAGNOSIS — Z780 Asymptomatic menopausal state: Secondary | ICD-10-CM | POA: Insufficient documentation

## 2023-09-26 DIAGNOSIS — M8580 Other specified disorders of bone density and structure, unspecified site: Secondary | ICD-10-CM | POA: Diagnosis present

## 2023-09-30 ENCOUNTER — Telehealth: Payer: Self-pay | Admitting: Licensed Clinical Social Worker

## 2023-09-30 NOTE — Telephone Encounter (Signed)
 I contacted [name] [name] to discuss the first part of her genetic testing results. No pathogenic variants were identified in the BRIP1 gene. The remainder of her full panel is pending. We will call her once it is back. Detailed clinic note to follow.   The test report has been scanned into EPIC and is located under the Molecular Pathology section of the Results Review tab.  A portion of the result report is included below for reference.      Dena Cary, MS, [facility] Genetic Counselor [name] Phone: [phone]

## 2023-10-05 ENCOUNTER — Ambulatory Visit: Payer: Self-pay | Admitting: Genetic Counselor

## 2023-10-05 ENCOUNTER — Telehealth: Payer: Self-pay | Admitting: Genetic Counselor

## 2023-10-05 DIAGNOSIS — Z1379 Encounter for other screening for genetic and chromosomal anomalies: Secondary | ICD-10-CM | POA: Insufficient documentation

## 2023-10-05 NOTE — Telephone Encounter (Signed)
 I spoke to [name] [name] to review results from her [facility] 26 gene CancerNext-Expanded +RNAinsight panel. Testing did not identify any mutations known to increase the risk for cancer (specifically did not identify the BRIP1 p.S624* (c.1871C>A) mutation that had been reported in her cousin). Testing did identify a variant of unknown significance (VUS) in BRCA1, c.2338C>G. Reviewed that we do not use VUS to change medical management or identify at risk family members, but will recontact regarding reclassifications.  Discussed that we do not know why she has cancer or why there is cancer in the family. It could be due to a different gene that we are not testing, or maybe our current technology may not be able to pick something up.  It will be important for her to keep in contact with genetics to keep up with whether additional testing may be needed.

## 2023-10-05 NOTE — Progress Notes (Signed)
 HPI:  Ms. Barilla was previously seen in the [facility] due to a personal and family history of cancer and concerns regarding a hereditary predisposition to cancer. Please refer to our prior cancer genetics clinic note for more information regarding our discussion, assessment and recommendations, at the time. Ms. Matecki recent genetic test results were disclosed to her, as were recommendations warranted by these results. These results and recommendations are discussed in more detail below.  CANCER HISTORY:  Ms. Marrin is a 68 y.o. female with history of kidney cancer (renal cell carcinoma) diagnosed in 2023. This was treated with partial left nephrectomy.   FAMILY HISTORY:  We obtained a detailed, 4-generation family history.  Significant diagnoses are listed below: Family History  Problem Relation Age of Onset   Diabetes Mother    Hypertension Mother    Heart disease Mother    Lung cancer Mother 2   Heart disease Father    Stroke Father    Emphysema Father    Stroke Brother    Deep vein thrombosis Brother    Macular degeneration Brother    Heart attack Brother    Lung cancer Maternal Uncle 40   Breast cancer Paternal Aunt        Age 76's   Breast cancer Paternal Aunt        61s   Lung cancer Maternal Grandmother 43   Cancer Cousin        peritoneal cancer dx 59, BRIP1+   Colon cancer Neg Hx    Stomach cancer Neg Hx    Rectal cancer Neg Hx    Esophageal cancer Neg Hx    Liver cancer Neg Hx    Ms. Otremba has 2 brothers, no cancers.    Ms. Hurney mother had lung cancer at 72 and passed at 10. Maternal uncle had lung cancer and passed at 40. His daughter, patient's cousin, was recently diagnosed with peritoneal cancer and found to have a pathogenic variant in BRIP1 called p.S624* (c.1871C>A). Patient's maternal grandmother had lung cancer and passed at 32.    Ms. Herda father passed at 77. Two paternal aunts had breast cancer over age 84. No  other known cancers on this side of the family, but limited information is known about all uncles/aunts/cousins.   Ms. Maxson is unaware of previous family history of genetic testing for hereditary cancer risks. There is no reported Ashkenazi Jewish ancestry. There is no known consanguinity.     GENETIC TEST RESULTS: Genetic testing reported out on 10/04/23 through the [facility] 76 gene CancerNext-Expanded +RNAinsight cancer panel found no pathogenic mutations. The CancerNext-Expanded gene panel offered by [facility] and includes sequencing, rearrangement, and RNA analysis for the following 76 genes: AIP, ALK, APC, ATM, AXIN2, BAP1, BARD1, BMPR1A, BRCA1, BRCA2, BRIP1, CDC73, CDH1, CDK4, CDKN1B, CDKN2A, CEBPA, CHEK2, CTNNA1, DDX41, DICER1, ETV6, FH, FLCN, GATA2, LZTR1, MAX, MBD4, MEN1, MET, MLH1, MSH2, MSH3, MSH6, MUTYH, NF1, NF2, NTHL1, PALB2, PHOX2B, PMS2, POT1, PRKAR1A, PTCH1, PTEN, RAD51C, RAD51D, RB1, RET, RUNX1, SDHA, SDHAF2, SDHB, SDHC, SDHD, SMAD4, SMARCA4, SMARCB1, SMARCE1, STK11, SUFU, TMEM127, TP53, TSC1, TSC2, VHL, and WT1 (sequencing and deletion/duplication); EGFR, HOXB13, KIT, MITF, PDGFRA, POLD1, and POLE (sequencing only); EPCAM and GREM1 (deletion/duplication only). The test report has been scanned into EPIC and is located under the Molecular Pathology section of the Results Review tab.  A portion of the result report is included below for reference.     We discussed with Ms. Hubby that because current genetic testing  is not perfect, it is possible there may be a gene mutation in one of these genes that current testing cannot detect, but that chance is small.  We also discussed, that there could be another gene that has not yet been discovered, or that we have not yet tested, that is responsible for the cancer diagnoses in the family. It is also possible there is a hereditary cause for the cancer in the family that Ms. Hoffa did not inherit and therefore was not identified in her  testing.  Therefore, it is important to remain in touch with cancer genetics in the future so that we can continue to offer Ms. Hinck the most up to date genetic testing.   Genetic testing did identify a variant of uncertain significance (VUS) was identified in the BRCA1 gene called p.Q780E (c.2338C>G).  At this time, it is unknown if this variant is associated with increased cancer risk or if this is a normal finding, but most variants such as this get reclassified to being inconsequential. It should not be used to make medical management decisions or identify at risk family members. With time, we suspect the lab will determine the significance of this variant, if any. If we do learn more about it, we will try to contact Ms. Mones to discuss it further. However, it is important to stay in touch with us  periodically and keep the address and phone number up to date.  We recommended Ms. Hubach pursue testing for the familial hereditary cancer gene mutation called BRIP1, c.1871C>A. Ms. Tenison test was normal and did not reveal the familial mutation. We call this result a true negative result because the cancer-causing mutation was identified in Ms. Fromme family, and she did not inherit it.  Given this negative result, Ms. Tee chances of developing BRIP1-related cancers are the same as they are in the general population. Testing for the BRIP1 mutation is still recommended for Ms. Malach relatives.   ADDITIONAL GENETIC TESTING: We discussed with Ms. Downen that her genetic testing was fairly extensive.  If there are genes identified to increase cancer risk that can be analyzed in the future, we would be happy to discuss and coordinate this testing at that time.    CANCER SCREENING RECOMMENDATIONS: Ms. Younghans test result is considered negative (normal).  This means that we have not identified a hereditary cause for her personal and family history of cancer at this time. Most cancers happen  by chance and this negative test suggests that her personal and family history of cancer may fall into this category.    Possible reasons for Ms. Johansen negative genetic test include:  1. There may be a gene mutation in one of these genes that current testing methods cannot detect but that chance is small.  2. There could be another gene that has not yet been discovered, or that we have not yet tested, that is responsible for the cancer diagnoses in the family.  3.  There may be no hereditary risk for cancer in the family. The cancers in Ms. Hermosillo and/or her family may be sporadic/familial or due to other genetic and environmental factors. 4. It is also possible there is a hereditary cause for the cancer in the family that Ms. Cornish did not inherit.  Therefore, it is recommended she continue to follow the cancer management and screening guidelines provided by her oncology and primary healthcare provider. An individual's cancer risk and medical management are not determined by genetic test results alone. Overall  cancer risk assessment incorporates additional factors, including personal medical history, family history, and any available genetic information that may result in a personalized plan for cancer prevention and surveillance  An individual's cancer risk and medical management are not determined by genetic test results alone. Overall cancer risk assessment incorporates additional factors, including personal medical history, family history, and any available genetic information that may result in a personalized plan for cancer prevention and surveillance.  Based on Ms. Hegger family of cancer, as well as her genetic test results, statistical models Barton Bos)  and literature data were used to estimate her risk of developing breast cancer. This estimates her lifetime risk of developing breast cancer to be approximately 6.5%.  The patient's lifetime breast cancer risk is a preliminary  estimate based on available information using one of several models endorsed by the [employer] (NCCN).  The NCCN recommends consideration of breast MRI screening as an adjunct to mammography for patients at high risk (defined as 20% or greater lifetime risk). Please note that a woman's breast cancer risk changes over time. It may increase or decrease based on age and any changes to the personal and/or family medical history. The risks and recommendations listed above apply to this patient at this point in time. In the future, she may or may not be eligible for the same medical management strategies and, in some cases, other medical management strategies may become available to her. If she is interested in an updated breast cancer risk assessment at a later date, she can contact us .     RECOMMENDATIONS FOR FAMILY MEMBERS:  Individuals in this family might be at some increased risk of developing cancer, over the general population risk, simply due to the family history of cancer.  We recommended women in this family have a yearly mammogram beginning at age 37, or 51 years younger than the earliest onset of cancer, an annual clinical breast exam, and perform monthly breast self-exams. Women in this family should also have a gynecological exam as recommended by their primary provider. All family members should be referred for colonoscopy starting at age 53, or 26 years younger than the earliest onset of cancer.  It is also possible there is a hereditary cause for the cancer in Ms. Duchon family that she did not inherit and therefore was not identified in her.  Based on Ms. Aills family history, we recommended her brothers have genetic counseling and testing for the familial BRIP1 mutation. Additionally, family members affected with cancer would benefit from their own genetic testing. Ms. Groth will let us  know if we can be of any assistance in coordinating genetic counseling  and/or testing for this family member.   FOLLOW-UP: Lastly, we discussed with Ms. Mazzola that cancer genetics is a rapidly advancing field and it is possible that new genetic tests will be appropriate for her and/or her family members in the future. We encouraged her to remain in contact with cancer genetics on an annual basis so we can update her personal and family histories and let her know of advances in cancer genetics that may benefit this family.   Our contact number was provided. Ms. Foulkes questions were answered to her satisfaction, and she knows she is welcome to call us  at anytime with additional questions or concerns.   Jobie Mulders, MS, [facility] Licensed, Retail [email or web address] [phone]

## 2023-10-12 NOTE — Progress Notes (Signed)
 68 y.o. G0P0 Single Caucasian female here for annual exam.  Currently being treated for UTI by PCP.  Symptoms have improved.    UTIs every 3 - 4 months.   Used Trimethoprim  for an extended period, and had recurrence of infection when stopped.  Tried D-mannose.   Has diarrhea off and on.   Status post partial left nephrectomy for renal cancer.   Planning to retire from [facility].   PCP: Norleen Lynwood ORN, MD   No LMP recorded. Patient is postmenopausal.           Sexually active: No.  The current method of family planning is post menopausal status.    Menopausal hormone therapy:  n/a Exercising: Yes.    Stretching and walking Smoker:  no  OB History  Gravida Para Term Preterm AB Living  0       SAB IAB Ectopic Multiple Live Births           HEALTH MAINTENANCE: Last 2 paps:  08/14/19 neg: HR HPV neg, 09/04/14 neg History of abnormal Pap or positive HPV:  yes, had a colposcopy, not aware of surgery on her cervix.  Mammogram:   11/01/22 Breast Density Cat A, BI-RADS CAT 1 neg, scheduled next week Colonoscopy:  12/15/16  Bone Density:  09/26/23  Result  osteopenia.  FRAX 8.2%/0.7%   Immunization History  Administered Date(s) Administered   Fluad Quad(high Dose 65+) 07/10/2023   Influenza Split 09/06/2016   Influenza Whole 06/20/2009, 06/20/2010   Influenza, High Dose Seasonal PF 12/09/2016, 11/30/2018, 11/29/2019, 11/25/2020, 11/26/2021   Influenza,inj,Quad PF,6+ Mos 08/14/2019   Influenza,inj,Quad PF,6-35 Mos 07/04/2017   Influenza-Unspecified 05/21/2012, 05/21/2014, 06/25/2015, 07/15/2017, 06/22/2022   PFIZER(Purple Top)SARS-COV-2 Vaccination 11/01/2019, 11/22/2019, 08/23/2020   Pneumococcal Conjugate-13 10/05/2017   Pneumococcal Polysaccharide-23 08/28/2014, 09/12/2015, 12/09/2016, 11/30/2018, 11/29/2019, 11/25/2020, 11/26/2021   Td 03/20/1996, 09/15/2009   Tdap 05/06/2020   Zoster Recombinant(Shingrix) 12/15/2020, 04/03/2021      reports that she has never smoked. She  has been exposed to tobacco smoke. She has never used smokeless tobacco. She reports current alcohol use. She reports that she does not use drugs.  Past Medical History:  Diagnosis Date   ALLERGIC RHINITIS 04/06/2007   ANXIETY 04/06/2007   Arthritis    Cataract    CIN I (cervical intraepithelial neoplasia I)    COLONIC POLYPS 08/04/2007   Diabetes mellitus without complication (HCC)    DIARRHEA, ACUTE 09/23/2010   Family history of adverse reaction to anesthesia    Mother and brother hard to wake up after anesthesia   Fibroid    GASTRIC POLYP 08/04/2007   GERD 08/04/2007   GOITER, MULTINODULAR 03/10/2009   Headache(784.0) 11/13/2008   History of kidney cancer    2023   Hx of blood clots    HYPERLIPIDEMIA 04/06/2007   HYPERTENSION 04/06/2007   ISCHEMIC COLITIS, HX OF 07/06/2007   Lichen sclerosus 2014   Osteopenia 08/2018   T score -1.1 FRAX 7% / 0.5%   Palpitations 03/10/2009   Renal mass    Left kidney   Rosacea 11/30/2007   THYROID  NODULE, LEFT 11/04/2010   Urinary frequency    UTI'S, RECURRENT 11/30/2007   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 pectoral/subclavicular abscess drainage  09/20/2004   removed skull cyst  09/21/1971   right foot neuroma  09/20/1986   ROBOTIC ASSITED PARTIAL NEPHRECTOMY Left 04/16/2022   Procedure: XI ROBOTIC ASSITED PARTIAL NEPHRECTOMY;  Surgeon: Devere Lonni Righter, MD;  Location: [facility];  Service: Urology;  Laterality: Left;   uterine  fibroids removed  09/20/1994    Current Outpatient Medications  Medication Sig Dispense Refill   Blood Glucose Monitoring Suppl (CONTOUR BLOOD GLUCOSE SYSTEM) w/Device KIT Use to check blood sugar once daily E11.9 1 kit 0   Cholecalciferol (VITAMIN D -3 PO) Take by mouth.     CONTOUR NEXT TEST test strip USE TO CHECK BLOOD SUGAR ONCE  DAILY 100 strip 3   guaiFENesin (MUCINEX) 600 MG 12 hr tablet Take 600 mg by mouth in the morning.     levocetirizine (XYZAL) 5 MG tablet Take 5 mg by mouth every evening.     Microlet Lancets MISC CHECK BLOOD SUGAR ONCE DAILY 100 each 3   nitrofurantoin  (MACRODANTIN ) 50 MG capsule Take 1 capsule (50 mg total) by mouth 4 (four) times daily. 28 capsule 0   potassium chloride  SA (KLOR-CON  M) 20 MEQ tablet Take 1 tablet (20 mEq total) by mouth 2 (two) times daily. 180 tablet 3   rosuvastatin  (CRESTOR ) 40 MG tablet Take 1 tablet (40 mg total) by mouth daily. 90 tablet 3   valsartan -hydrochlorothiazide  (DIOVAN -HCT) 320-25 MG tablet Take 1 tablet by mouth daily. 90 tablet 3   diphenhydrAMINE  (BENADRYL  ALLERGY ) 25 MG tablet Take 25 mg by mouth as needed for allergies. (Patient not taking: Reported on 10/26/2023)     ezetimibe  (ZETIA ) 10 MG tablet Take 1 tablet (10 mg total) by mouth daily. (Patient not taking: Reported on 07/27/2023) 90 tablet 3   Menthol, Topical Analgesic, (BIOFREEZE EX) Apply 1 Application topically 3 (three) times daily as needed (muscle pain/aches.). (Patient not taking: Reported on 06/15/2023)     Omega-3 Fatty Acids (FISH OIL OMEGA-3 PO) Take by mouth. (Patient not taking: Reported on 10/26/2023)     omeprazole (PRILOSEC) 20 MG capsule Take 20 mg by mouth daily as needed (indigestion/heartburn.). (Patient not taking: Reported on 09/12/2023)     prednisoLONE acetate (PRED FORTE) 1 % ophthalmic suspension Place into the left eye. (Patient not taking: Reported on 06/15/2023)     sulfamethoxazole -trimethoprim  (BACTRIM  DS) 800-160 MG tablet Take 1 tablet by  mouth 2 (two) times daily. (Patient not taking: Reported on 10/26/2023) 20 tablet 0   triamcinolone  cream (KENALOG ) 0.1 % Apply 1 Application topically 2 (two) times daily. (Patient not taking: Reported on 10/26/2023) 30 g 1   trimethoprim  (TRIMPEX ) 100 MG tablet 1 tab by mouth on Monday wed Friday (Patient not taking: Reported on 10/26/2023) 36 tablet 3   UNABLE TO FIND Med Name: Instaflex Joint Support GNC (Patient not taking: Reported on 05/31/2023)     Vibegron  (GEMTESA ) 75 MG TABS 1 tab by mouth once daily (Patient not taking: Reported on 10/26/2023) 90 tablet 3   No current facility-administered medications for this visit.    ALLERGIES: Avelox [moxifloxacin], Cefuroxime axetil, Ciprofloxacin, Crab extract, Lipitor [atorvastatin calcium ], and Latex  Family History  Problem Relation Age of Onset   Diabetes Mother    Hypertension Mother  Heart disease Mother    Lung cancer Mother 68   Heart disease Father    Stroke Father    Emphysema Father    Stroke Brother    Deep vein thrombosis Brother    Macular degeneration Brother    Heart attack Brother    Lung cancer Maternal Uncle 58   Breast cancer Paternal Aunt        Age 78's   Breast cancer Paternal Aunt        65s   Lung cancer Maternal Grandmother 40   Cancer Cousin        peritoneal cancer dx 32, BRIP1+   Cancer Cousin        ovarian cancer   Colon cancer Neg Hx    Stomach cancer Neg Hx    Rectal cancer Neg Hx    Esophageal cancer Neg Hx    Liver cancer Neg Hx     Review of Systems  All other systems reviewed and are negative.   PHYSICAL EXAM:  BP 108/60 (BP Location: Left Arm, Patient Position: Sitting, Cuff Size: Normal)   Pulse 79   Resp (!) 96   Ht 5' 1 (1.549 m)   Wt 172 lb (78 kg)   BMI 32.50 kg/m     General appearance: alert, cooperative and appears stated age Head: normocephalic, without obvious abnormality, atraumatic Neck: no adenopathy, supple, symmetrical, trachea midline and thyroid  normal to  inspection and palpation Lungs: clear to auscultation bilaterally Breasts: normal appearance, no masses or tenderness, No nipple retraction or dimpling, No nipple discharge or bleeding, No axillary adenopathy Heart: regular rate and rhythm Abdomen: soft, non-tender; no masses, no organomegaly Extremities: extremities normal, atraumatic, no cyanosis or edema Skin: skin color, texture, turgor normal. No rashes or lesions Lymph nodes: cervical, supraclavicular, and axillary nodes normal. Neurologic: grossly normal  Pelvic: External genitalia:  no lesions              No abnormal inguinal nodes palpated.              Urethra:  normal appearing urethra with no masses, tenderness or lesions              Bartholins and Skenes: normal                 Vagina: normal appearing vagina with normal color and discharge, no lesions.  Atrophy noted.               Cervix: no lesions              Pap taken: Yes.   Bimanual Exam:  Uterus:  normal size, contour, position, consistency, mobility, non-tender              Adnexa: no mass, fullness, tenderness              Rectal exam: Yes.  .  Confirms.              Anus:  normal sphincter tone, no lesions  Chaperone was present for exam:  Darice BROCKS, CMA  ASSESSMENT: Well woman visit with gynecologic exam Status post right oophorectomy for dermoid cyst.  Status post hysteroscopy with dilation and curettage.  Benign polyp. Hx uterine fibroids.  Hx left hydrosalpinx.  Hx CIN I.  Cervical cancer screening today Status post partial left nephrectomy for renal cancer.  Genetic testing showing BRCA1 variants of unknown significance. Hx recurrent UTIs.  Osteopenia. Lichen sclerosus.  Ischemic colitis while on birth control pills.  Hx vascular thrombosis.  PHQ2: 0.  PLAN: Mammogram screening discussed. Self breast awareness reviewed. Pap and HRV collected:  Yes.   Guidelines for Calcium , Vitamin D , regular exercise program including cardiovascular and  weight bearing exercise. Medication refills:  NA Labs and vaccines with PCP.  BMD in 2026.  Follow up:  yearly and prn.

## 2023-10-21 ENCOUNTER — Telehealth: Payer: Self-pay | Admitting: Internal Medicine

## 2023-10-21 ENCOUNTER — Other Ambulatory Visit (INDEPENDENT_AMBULATORY_CARE_PROVIDER_SITE_OTHER): Payer: 59

## 2023-10-21 ENCOUNTER — Encounter: Payer: Self-pay | Admitting: Internal Medicine

## 2023-10-21 ENCOUNTER — Other Ambulatory Visit: Payer: Self-pay | Admitting: Internal Medicine

## 2023-10-21 DIAGNOSIS — R300 Dysuria: Secondary | ICD-10-CM

## 2023-10-21 LAB — URINALYSIS, ROUTINE W REFLEX MICROSCOPIC
Bilirubin Urine: NEGATIVE
Ketones, ur: NEGATIVE
Nitrite: NEGATIVE
Specific Gravity, Urine: <=1.005 — AB (ref 1.000–1.030)
Total Protein, Urine: NEGATIVE
Urine Glucose: NEGATIVE
Urobilinogen, UA: 0.2 (ref 0.0–1.0)
pH: 6 (ref 5.0–8.0)

## 2023-10-21 MED ORDER — NITROFURANTOIN MACROCRYSTAL 50 MG PO CAPS: 50.0000 mg | ORAL_CAPSULE | Freq: Four times a day (QID) | ORAL | 0 refills | Status: DC

## 2023-10-21 NOTE — Telephone Encounter (Signed)
Ok orders are done, thanks

## 2023-10-21 NOTE — Telephone Encounter (Signed)
  Chief Complaint: urinary sx requesting if she can come to office and submit sample  Symptoms: urinary frequency, burning, urgency, cloudy color. Feels "bloated". Last night "night sweats".  Frequency: Wednesday  Pertinent Negatives: Patient denies fever no blood in urine  Disposition: [] ED /[] Urgent Care (no appt availability in office) / [] Appointment(In office/virtual)/ []  [facility]/ [] Home Care/ [] Refused Recommended Disposition /[] [facility]/ [x]  Follow-up with PCP Additional Notes:   Requesting to come to office and submit urine sample. Reports PCP knows sx chronic issue for her. No available appt today . Patient requesting a call back if sample  can be submitted. Recommended UC if patient has not received call back today .       Copied from CRM [id number]. Topic: Clinical - Pink Word Triage >> Oct 21, 2023  8:27 AM Kathryne Eriksson wrote: Reason for Triage: UTI >> Oct 21, 2023  8:29 AM Kathryne Eriksson wrote: Concerns of possible uti.Patient states her symptoms are urgency, frequency, burning and cloudy color urine.

## 2023-10-21 NOTE — Telephone Encounter (Signed)
 Called and let Pt know

## 2023-10-22 LAB — URINE CULTURE

## 2023-10-26 ENCOUNTER — Ambulatory Visit: Payer: 59 | Admitting: Obstetrics and Gynecology

## 2023-10-26 ENCOUNTER — Other Ambulatory Visit (HOSPITAL_COMMUNITY)
Admission: RE | Admit: 2023-10-26 | Discharge: 2023-10-26 | Disposition: A | Discharge status: Home / Self Care | Payer: 59 | Source: Ambulatory Visit | Attending: Obstetrics and Gynecology | Admitting: Obstetrics and Gynecology

## 2023-10-26 ENCOUNTER — Encounter: Payer: Self-pay | Admitting: Obstetrics and Gynecology

## 2023-10-26 VITALS — BP 108/60 | HR 79 | Resp 96 | Ht 61.0 in | Wt 172.0 lb

## 2023-10-26 DIAGNOSIS — Z1331 Encounter for screening for depression: Secondary | ICD-10-CM | POA: Diagnosis not present

## 2023-10-26 DIAGNOSIS — Z87440 Personal history of urinary (tract) infections: Secondary | ICD-10-CM | POA: Diagnosis not present

## 2023-10-26 DIAGNOSIS — M8580 Other specified disorders of bone density and structure, unspecified site: Secondary | ICD-10-CM

## 2023-10-26 DIAGNOSIS — Z124 Encounter for screening for malignant neoplasm of cervix: Secondary | ICD-10-CM

## 2023-10-26 DIAGNOSIS — Z01419 Encounter for gynecological examination (general) (routine) without abnormal findings: Secondary | ICD-10-CM | POA: Diagnosis not present

## 2023-10-26 NOTE — Patient Instructions (Signed)

## 2023-10-31 ENCOUNTER — Encounter: Payer: Self-pay | Admitting: Obstetrics and Gynecology

## 2023-10-31 LAB — CYTOLOGY - PAP
Comment: NEGATIVE
Diagnosis: NEGATIVE
High risk HPV: NEGATIVE

## 2023-11-03 ENCOUNTER — Ambulatory Visit
Admission: RE | Admit: 2023-11-03 | Discharge: 2023-11-03 | Disposition: A | Discharge status: Home / Self Care | Payer: 59 | Source: Ambulatory Visit | Attending: Obstetrics and Gynecology | Admitting: Obstetrics and Gynecology

## 2023-11-03 DIAGNOSIS — Z1231 Encounter for screening mammogram for malignant neoplasm of breast: Secondary | ICD-10-CM

## 2023-11-08 ENCOUNTER — Encounter: Payer: Self-pay | Admitting: Obstetrics and Gynecology

## 2023-11-29 ENCOUNTER — Other Ambulatory Visit: Payer: Self-pay

## 2023-11-29 MED ORDER — ROSUVASTATIN CALCIUM 40 MG PO TABS: 40.0000 mg | ORAL_TABLET | Freq: Every day | ORAL | 3 refills | Status: DC

## 2023-12-09 ENCOUNTER — Other Ambulatory Visit: Payer: Self-pay | Admitting: Internal Medicine

## 2023-12-09 NOTE — Telephone Encounter (Signed)
 Copied from CRM [id number]. Topic: Clinical - Prescription Issue >> Dec 09, 2023 10:45 AM Isabell A wrote: Reason for CRM: Patient states rosuvastatin (CRESTOR) 40 MG tablet was sent to the wrong pharmacy - should've been sent to [facility] on file.

## 2023-12-22 ENCOUNTER — Other Ambulatory Visit: Payer: Self-pay

## 2023-12-22 ENCOUNTER — Telehealth: Payer: Self-pay | Admitting: Internal Medicine

## 2023-12-22 MED ORDER — ROSUVASTATIN CALCIUM 40 MG PO TABS: 40.0000 mg | ORAL_TABLET | Freq: Every day | ORAL | 3 refills | Status: AC

## 2023-12-22 NOTE — Telephone Encounter (Signed)
 Prescription has been sent to [facility].

## 2023-12-22 NOTE — Telephone Encounter (Signed)
 Copied from CRM [id number]. Topic: Clinical - Prescription Issue >> Dec 22, 2023 11:02 AM Melissa C wrote: Reason for CRM: patient's rosuvastatin (CRESTOR) 40 MG tablet was sent to [facility] however it needs to go to [facility]) - [place], [place] - [address] [place] [address] [place] [address], [place] [address] Phone: [phone]  Fax: [phone]  Please advise, thank you.

## 2023-12-23 ENCOUNTER — Ambulatory Visit: Admitting: Internal Medicine

## 2023-12-23 ENCOUNTER — Encounter: Payer: Self-pay | Admitting: Internal Medicine

## 2023-12-23 ENCOUNTER — Other Ambulatory Visit: Payer: Self-pay | Admitting: Internal Medicine

## 2023-12-23 VITALS — BP 120/76 | HR 65 | Temp 98.5°F | Ht 61.0 in | Wt 172.0 lb

## 2023-12-23 DIAGNOSIS — E1165 Type 2 diabetes mellitus with hyperglycemia: Secondary | ICD-10-CM | POA: Diagnosis not present

## 2023-12-23 DIAGNOSIS — E559 Vitamin D deficiency, unspecified: Secondary | ICD-10-CM | POA: Diagnosis not present

## 2023-12-23 DIAGNOSIS — I872 Venous insufficiency (chronic) (peripheral): Secondary | ICD-10-CM

## 2023-12-23 DIAGNOSIS — Z7984 Long term (current) use of oral hypoglycemic drugs: Secondary | ICD-10-CM

## 2023-12-23 DIAGNOSIS — I10 Essential (primary) hypertension: Secondary | ICD-10-CM | POA: Diagnosis not present

## 2023-12-23 DIAGNOSIS — R300 Dysuria: Secondary | ICD-10-CM | POA: Diagnosis not present

## 2023-12-23 DIAGNOSIS — E538 Deficiency of other specified B group vitamins: Secondary | ICD-10-CM

## 2023-12-23 LAB — BASIC METABOLIC PANEL WITH GFR
BUN: 19 mg/dL (ref 6–23)
CO2: 26 meq/L (ref 19–32)
Calcium: 9.4 mg/dL (ref 8.4–10.5)
Chloride: 100 meq/L (ref 96–112)
Creatinine, Ser: 0.76 mg/dL (ref 0.40–1.20)
GFR: 80.84 mL/min (ref >60.00)
Glucose, Bld: 132 mg/dL — ABNORMAL HIGH (ref 70–99)
Potassium: 3.8 meq/L (ref 3.5–5.1)
Sodium: 137 meq/L (ref 135–145)

## 2023-12-23 LAB — URINALYSIS, ROUTINE W REFLEX MICROSCOPIC
Bilirubin Urine: NEGATIVE
Ketones, ur: NEGATIVE
Nitrite: NEGATIVE
Specific Gravity, Urine: <=1.005 — AB (ref 1.000–1.030)
Total Protein, Urine: NEGATIVE
Urine Glucose: NEGATIVE
Urobilinogen, UA: 0.2 (ref 0.0–1.0)
pH: 6 (ref 5.0–8.0)

## 2023-12-23 LAB — CBC WITH DIFFERENTIAL/PLATELET
Basophils Absolute: 0.1 10*3/uL (ref 0.0–0.1)
Basophils Relative: 1.1 % (ref 0.0–3.0)
Eosinophils Absolute: 0.2 10*3/uL (ref 0.0–0.7)
Eosinophils Relative: 3.4 % (ref 0.0–5.0)
HCT: 37.9 % (ref 36.0–46.0)
Hemoglobin: 12.7 g/dL (ref 12.0–15.0)
Lymphocytes Relative: 36.9 % (ref 12.0–46.0)
Lymphs Abs: 2.4 10*3/uL (ref 0.7–4.0)
MCHC: 33.6 g/dL (ref 30.0–36.0)
MCV: 82.3 fl (ref 78.0–100.0)
Monocytes Absolute: 0.6 10*3/uL (ref 0.1–1.0)
Monocytes Relative: 9.1 % (ref 3.0–12.0)
Neutro Abs: 3.2 10*3/uL (ref 1.4–7.7)
Neutrophils Relative %: 49.5 % (ref 43.0–77.0)
Platelets: 217 10*3/uL (ref 150.0–400.0)
RBC: 4.6 Mil/uL (ref 3.87–5.11)
RDW: 13.8 % (ref 11.5–15.5)
WBC: 6.4 10*3/uL (ref 4.0–10.5)

## 2023-12-23 LAB — HEPATIC FUNCTION PANEL
ALT: 37 U/L — ABNORMAL HIGH (ref 0–35)
AST: 30 U/L (ref 0–37)
Albumin: 4.8 g/dL (ref 3.5–5.2)
Alkaline Phosphatase: 66 U/L (ref 39–117)
Bilirubin, Direct: 0.1 mg/dL (ref 0.0–0.3)
Total Bilirubin: 0.5 mg/dL (ref 0.2–1.2)
Total Protein: 7.3 g/dL (ref 6.0–8.3)

## 2023-12-23 LAB — MICROALBUMIN / CREATININE URINE RATIO
Creatinine,U: 19.8 mg/dL
Microalb Creat Ratio: 71.3 mg/g — ABNORMAL HIGH (ref 0.0–30.0)
Microalb, Ur: 1.4 mg/dL (ref 0.0–1.9)

## 2023-12-23 LAB — LIPID PANEL
Cholesterol: 114 mg/dL (ref 0–200)
HDL: 40.7 mg/dL (ref >39.00)
LDL Cholesterol: 48 mg/dL (ref 0–99)
NonHDL: 73.65
Total CHOL/HDL Ratio: 3
Triglycerides: 128 mg/dL (ref 0.0–149.0)
VLDL: 25.6 mg/dL (ref 0.0–40.0)

## 2023-12-23 LAB — VITAMIN D 25 HYDROXY (VIT D DEFICIENCY, FRACTURES): VITD: 66.49 ng/mL (ref 30.00–100.00)

## 2023-12-23 LAB — VITAMIN B12: Vitamin B-12: 698 pg/mL (ref 211–911)

## 2023-12-23 LAB — HEMOGLOBIN A1C: Hgb A1c MFr Bld: 7.6 % — ABNORMAL HIGH (ref 4.6–6.5)

## 2023-12-23 LAB — TSH: TSH: 1.04 u[IU]/mL (ref 0.35–5.50)

## 2023-12-23 MED ORDER — CEPHALEXIN 500 MG PO CAPS: ORAL_CAPSULE | ORAL | 0 refills | Status: DC

## 2023-12-23 MED ORDER — LEVOFLOXACIN 500 MG PO TABS: 500.0000 mg | ORAL_TABLET | Freq: Every day | ORAL | 0 refills | Status: DC

## 2023-12-23 MED ORDER — METFORMIN HCL ER 500 MG PO TB24
500.0000 mg | ORAL_TABLET | Freq: Every day | ORAL | 3 refills | Status: DC
Start: 2023-12-23 — End: 2024-05-15

## 2023-12-23 NOTE — Patient Instructions (Signed)
 Please take all new medication as prescribed  - the antibiotic - levaquin  Please take all new medication as prescribed - the cephalexin antibiotic intended for 3 times per wk for 3 months (such as Mon wed fri)  Please continue all other medications as before, and refills have been done if requested.  Please have the pharmacy call with any other refills you may need.  Please keep your appointments with your specialists as you may have planned  Please go to the LAB at the blood drawing area for the tests to be done - just the urine testing today  You will be contacted by phone if any changes need to be made immediately.  Otherwise, you will receive a letter about your results with an explanation, but please check with MyChart first.

## 2023-12-23 NOTE — Progress Notes (Addendum)
 Patient ID: [name] [name], female   DOB: [birth year], 68 y.o.   MRN: [id number]        Chief Complaint: follow up dysuria, dm, htn,        HPI:  [name] [name] is a 68 y.o. female here with c/o dysuria x 3 days and Denies urinary symptoms such as frequency, urgency, flank pain, hematuria or n/v, fever, chills.  Pt denies chest pain, increased sob or doe, wheezing, orthopnea, PND, increased LE swelling, palpitations, dizziness or syncope.   Pt denies polydipsia, polyuria, or new focal neuro s/s.    Pt denies recent wt loss, night sweats, loss of appetite, or other constitutional symptoms         Wt Readings from Last 3 Encounters:  12/23/23 172 lb (78 kg)  10/26/23 172 lb (78 kg)  09/12/23 171 lb (77.6 kg)   BP Readings from Last 3 Encounters:  12/23/23 120/76  10/26/23 108/60  09/12/23 124/70         Past Medical History:  Diagnosis Date   ALLERGIC RHINITIS 04/06/2007   ANXIETY 04/06/2007   Arthritis    Cataract    CIN I (cervical intraepithelial neoplasia I)    COLONIC POLYPS 08/04/2007   Diabetes mellitus without complication (HCC)    DIARRHEA, ACUTE 09/23/2010   Family history of adverse reaction to anesthesia    Mother and brother hard to wake up after anesthesia   Fibroid    GASTRIC POLYP 08/04/2007   GERD 08/04/2007   GOITER, MULTINODULAR 03/10/2009   Headache(784.0) 11/13/2008   History of kidney cancer    2023   Hx of blood clots    HYPERLIPIDEMIA 04/06/2007   HYPERTENSION 04/06/2007   ISCHEMIC COLITIS, HX OF 07/06/2007   Lichen sclerosus 2014   Osteopenia 08/2018   T score -1.1 FRAX 7% / 0.5%   Palpitations 03/10/2009   Renal mass    Left kidney   Rosacea 11/30/2007   THYROID NODULE, LEFT 11/04/2010   Urinary frequency    UTI'S, RECURRENT 11/30/2007   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 pectoral/subclavicular abscess drainage  09/20/2004   removed skull cyst  09/21/1971   right foot neuroma  09/20/1986   ROBOTIC ASSITED PARTIAL NEPHRECTOMY Left 04/16/2022   Procedure: XI ROBOTIC ASSITED PARTIAL NEPHRECTOMY;  Surgeon: Rene Paci, MD;  Location: [facility];  Service: Urology;  Laterality: Left;   uterine fibroids removed  09/20/1994    reports that she has never smoked. She has been exposed to tobacco smoke. She has never used smokeless tobacco. She reports current alcohol use. She reports that she does not use drugs. family history includes Breast cancer (age of onset: 1 - 21) in her paternal aunt and paternal aunt; Cancer in her cousin; Deep vein thrombosis in her brother; Diabetes in her mother; Emphysema in her father; Heart attack in her brother; Heart disease in her father and mother; Hypertension in her mother; Lung  cancer (age of onset: 31) in her maternal grandmother; Lung cancer (age of onset: 49) in her mother; Lung cancer (age of onset: 17) in her maternal uncle; Macular degeneration in her brother; Ovarian cancer (age of onset: 70 - 27) in her cousin; Stroke in her brother and father. Allergies  Allergen Reactions   Avelox [Moxifloxacin] Diarrhea   Cefuroxime Axetil Diarrhea   Ciprofloxacin Diarrhea   Crab Extract Diarrhea and Nausea And Vomiting    ONLY CRAB ALLERGY (no other shellfish allergies)   Lipitor [Atorvastatin Calcium] Other (See Comments)    Sensitive (extreme muscle aches)   Latex Rash    Band-aids if left on too long   Current Outpatient Medications on File Prior to Visit  Medication Sig Dispense Refill    albuterol (VENTOLIN HFA) 108 (90 Base) MCG/ACT inhaler 1-2 puffs Inhalation every 4-6 hours prn cough/wheeze for 90 days     azelastine (OPTIVAR) 0.05 % ophthalmic solution 1 drop into affected eye Ophthalmic Twice a day for 90 days     Blood Glucose Monitoring Suppl (CONTOUR BLOOD GLUCOSE SYSTEM) w/Device KIT Use to check blood sugar once daily E11.9 1 kit 0   Cholecalciferol (VITAMIN D-3 PO) Take by mouth.     CONTOUR NEXT TEST test strip USE TO CHECK BLOOD SUGAR ONCE  DAILY 100 strip 3   hydrOXYzine (ATARAX) 25 MG tablet TAKE 1 TABLET BY MOUTH EVERY 8 HOURS THREE TIMES A DAY for 90     levocetirizine (XYZAL) 5 MG tablet Take 5 mg by mouth every evening.     Microlet Lancets MISC CHECK BLOOD SUGAR ONCE DAILY 100 each 3   potassium chloride SA (KLOR-CON M) 20 MEQ tablet Take 1 tablet (20 mEq total) by mouth 2 (two) times daily. 180 tablet 3   prednisoLONE acetate (PRED FORTE) 1 % ophthalmic suspension Place into the left eye.     rosuvastatin (CRESTOR) 40 MG tablet Take 1 tablet (40 mg total) by mouth daily. 90 tablet 3   Spacer/Aero-Holding Chambers (AEROCHAMBER HOLDING CHAMBER) DEVI as directed for 30 days     valsartan-hydrochlorothiazide (DIOVAN-HCT) 320-25 MG tablet Take 1 tablet by mouth daily. 90 tablet 3   ezetimibe (ZETIA) 10 MG tablet Take 1 tablet (10 mg total) by mouth daily. (Patient not taking: Reported on 12/23/2023) 90 tablet 3   Menthol, Topical Analgesic, (BIOFREEZE EX) Apply 1 Application topically 3 (three) times daily as needed (muscle pain/aches.). (Patient not taking: Reported on 06/15/2023)     UNABLE TO FIND Med Name: Instaflex Joint Support GNC (Patient not taking: Reported on 12/23/2023)     Vibegron (GEMTESA) 75 MG TABS 1 tab by mouth once daily (Patient not taking: Reported on 12/23/2023) 90 tablet 3   No current facility-administered medications on file prior to visit.        ROS:  All others reviewed and negative.  Objective        PE:  BP 120/76 (BP Location:  Left Arm, Patient Position: Sitting, Cuff Size: Normal)   Pulse 65   Temp 98.5 F (36.9 C) (Oral)   Ht 5\' 1"  (1.549 m)   Wt 172 lb (78 kg)   SpO2 98%   BMI 32.50 kg/m                 Constitutional: Pt appears in NAD               HENT: Head: NCAT.  Right Ear: External ear normal.                 Left Ear: External ear normal.                Eyes: . Pupils are equal, round, and reactive to light. Conjunctivae and EOM are normal               Nose: without d/c or deformity               Neck: Neck supple. Gross normal ROM               Cardiovascular: Normal rate and regular rhythm.                 Pulmonary/Chest: Effort normal and breath sounds without rales or wheezing.                Abd:  Soft, low mid abd tender, ND, + BS, no organomegaly               Neurological: Pt is alert. At baseline orientation, motor grossly intact               Skin: Skin is warm. No rashes, no other new lesions, LE edema - trace bilateral               Psychiatric: Pt behavior is normal without agitation   Micro: none  Cardiac tracings I have personally interpreted today:  none  Pertinent Radiological findings (summarize): none   Lab Results  Component Value Date   WBC 6.4 12/23/2023   HGB 12.7 12/23/2023   HCT 37.9 12/23/2023   PLT 217.0 12/23/2023   GLUCOSE 132 (H) 12/23/2023   CHOL 114 12/23/2023   TRIG 128.0 12/23/2023   HDL 40.70 12/23/2023   LDLDIRECT 188.0 03/01/2023   LDLCALC 48 12/23/2023   ALT 37 (H) 12/23/2023   AST 30 12/23/2023   NA 137 12/23/2023   K 3.8 12/23/2023   CL 100 12/23/2023   CREATININE 0.76 12/23/2023   BUN 19 12/23/2023   CO2 26 12/23/2023   TSH 1.04 12/23/2023   HGBA1C 7.6 (H) 12/23/2023   MICROALBUR 1.4 12/23/2023   Assessment/Plan:  INDIANA GAMERO is a 68 y.o. White or Caucasian [1] female with  has a past medical history of ALLERGIC RHINITIS (04/06/2007), ANXIETY (04/06/2007), Arthritis, Cataract, CIN I (cervical intraepithelial  neoplasia I), COLONIC POLYPS (08/04/2007), Diabetes mellitus without complication (HCC), DIARRHEA, ACUTE (09/23/2010), Family history of adverse reaction to anesthesia, Fibroid, GASTRIC POLYP (08/04/2007), GERD (08/04/2007), GOITER, MULTINODULAR (03/10/2009), Headache(784.0) (11/13/2008), History of kidney cancer, blood clots, HYPERLIPIDEMIA (04/06/2007), HYPERTENSION (04/06/2007), ISCHEMIC COLITIS, HX OF (07/06/2007), Lichen sclerosus (2014), Osteopenia (08/2018), Palpitations (03/10/2009), Renal mass, Rosacea (11/30/2007), THYROID NODULE, LEFT (11/04/2010), Urinary frequency, UTI'S, RECURRENT (11/30/2007), and 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  Essential hypertension BP Readings from Last 3 Encounters:  12/23/23 120/76  10/26/23 108/60  09/12/23 124/70   Stable, pt to continue medical treatment diovan hct 320 25 qd   Diabetes Lab Results  Component Value Date   HGBA1C 7.6 (H) 12/23/2023   Mild uncontrolled, pt to start metformin ER 500 mg - 1 qd   Chronic venous insufficiency Chronic stable, for compression stocking bilat  Dysuria Mild to mod, high suspicion UTI now the 4th recently, today for antibx course levaquin 500 every day, then cephalexin 3 times weekly for prevention,  to f/u any worsening symptoms or concerns  Followup: Return if symptoms  worsen or fail to improve.  Oliver Barre, MD 12/25/2023 8:30 PM [facility] [place] [facility] - [facility] Medicine

## 2023-12-25 LAB — URINE CULTURE

## 2023-12-25 NOTE — Assessment & Plan Note (Addendum)
 Mild to mod, high suspicion UTI now the 4th recently, today for antibx course levaquin 500 every day, then cephalexin 3 times weekly for prevention,  to f/u any worsening symptoms or concerns

## 2023-12-25 NOTE — Assessment & Plan Note (Signed)
 BP Readings from Last 3 Encounters:  12/23/23 120/76  10/26/23 108/60  09/12/23 124/70   Stable, pt to continue medical treatment diovan hct 320 25 qd

## 2023-12-25 NOTE — Assessment & Plan Note (Signed)
 Chronic stable, for compression stocking bilat

## 2023-12-25 NOTE — Assessment & Plan Note (Signed)
 Lab Results  Component Value Date   HGBA1C 7.6 (H) 12/23/2023   Mild uncontrolled, pt to start metformin ER 500 mg - 1 qd

## 2024-03-12 ENCOUNTER — Ambulatory Visit: Payer: 59 | Admitting: Internal Medicine

## 2024-03-18 ENCOUNTER — Other Ambulatory Visit: Payer: Self-pay | Admitting: Internal Medicine

## 2024-03-19 ENCOUNTER — Other Ambulatory Visit: Payer: Self-pay

## 2024-03-30 ENCOUNTER — Other Ambulatory Visit: Payer: Self-pay | Admitting: Internal Medicine

## 2024-04-02 LAB — HM DIABETES EYE EXAM

## 2024-04-03 ENCOUNTER — Ambulatory Visit: Admitting: Internal Medicine

## 2024-04-03 ENCOUNTER — Encounter: Payer: Self-pay | Admitting: Internal Medicine

## 2024-04-03 ENCOUNTER — Ambulatory Visit: Payer: Self-pay | Admitting: Internal Medicine

## 2024-04-03 VITALS — BP 110/72 | HR 74 | Temp 98.5°F | Ht 61.0 in | Wt 172.2 lb

## 2024-04-03 DIAGNOSIS — E538 Deficiency of other specified B group vitamins: Secondary | ICD-10-CM

## 2024-04-03 DIAGNOSIS — E7800 Pure hypercholesterolemia, unspecified: Secondary | ICD-10-CM

## 2024-04-03 DIAGNOSIS — I10 Essential (primary) hypertension: Secondary | ICD-10-CM

## 2024-04-03 DIAGNOSIS — Z7984 Long term (current) use of oral hypoglycemic drugs: Secondary | ICD-10-CM

## 2024-04-03 DIAGNOSIS — E559 Vitamin D deficiency, unspecified: Secondary | ICD-10-CM

## 2024-04-03 DIAGNOSIS — R3915 Urgency of urination: Secondary | ICD-10-CM

## 2024-04-03 DIAGNOSIS — E1165 Type 2 diabetes mellitus with hyperglycemia: Secondary | ICD-10-CM

## 2024-04-03 DIAGNOSIS — N390 Urinary tract infection, site not specified: Secondary | ICD-10-CM

## 2024-04-03 LAB — URINALYSIS, ROUTINE W REFLEX MICROSCOPIC
Bilirubin Urine: NEGATIVE
Hgb urine dipstick: NEGATIVE
Ketones, ur: NEGATIVE
Nitrite: NEGATIVE
Specific Gravity, Urine: <=1.005 — AB (ref 1.000–1.030)
Total Protein, Urine: NEGATIVE
Urine Glucose: NEGATIVE
Urobilinogen, UA: 0.2 (ref 0.0–1.0)
pH: 6 (ref 5.0–8.0)

## 2024-04-03 MED ORDER — CEPHALEXIN 500 MG PO CAPS: ORAL_CAPSULE | ORAL | 2 refills | Status: AC

## 2024-04-03 NOTE — Assessment & Plan Note (Signed)
 Lab Results  Component Value Date   HGBA1C 7.6 (H) 12/23/2023   uncontrolled, pt to restart metformin  ER 500 mg qd

## 2024-04-03 NOTE — Assessment & Plan Note (Signed)
 BP Readings from Last 3 Encounters:  04/03/24 110/72  12/23/23 120/76  10/26/23 108/60   Stable, pt to continue medical treatment diovan  hct 320 25 qd

## 2024-04-03 NOTE — Assessment & Plan Note (Signed)
 Lab Results  Component Value Date   LDLCALC 48 12/23/2023   Stable, pt to continue current statin crestor  40 mg qd

## 2024-04-03 NOTE — Assessment & Plan Note (Signed)
 With worsening subjective after out of preventive UTI med - exam benign, but will need UA and Cx, with tx pending results.   Also gave refill of caphalexin preventive to start if culture neg

## 2024-04-03 NOTE — Progress Notes (Signed)
 Patient ID: [name] [name], female   DOB: 06-25-1956, 68 y.o.   MRN: [id number]        Chief Complaint: follow up urinary urgency with bloating and night sweats       HPI:  [name] [name] is a 68 y.o. female here with c/o 3 days worsening urinary urgency, soon after running out of her 90 days of 3x weekly cephalexin  preventive.  Denies other urinary symptoms such as dysuria, frequency, flank pain, hematuria or n/v, fever, chills, but here  about of abundance of caution.  Pt denies chest pain, increased sob or doe, wheezing, orthopnea, PND, increased LE swelling, palpitations, dizziness or syncope.   Pt denies polydipsia, polyuria, or new focal neuro s/s.          Wt Readings from Last 3 Encounters:  04/03/24 172 lb 3.2 oz (78.1 kg)  12/23/23 172 lb (78 kg)  10/26/23 172 lb (78 kg)   BP Readings from Last 3 Encounters:  04/03/24 110/72  12/23/23 120/76  10/26/23 108/60         Past Medical History:  Diagnosis Date   ALLERGIC RHINITIS 04/06/2007   ANXIETY 04/06/2007   Arthritis    Cataract    CIN I (cervical intraepithelial neoplasia I)    COLONIC POLYPS 08/04/2007   Diabetes mellitus without complication (HCC)    DIARRHEA, ACUTE 09/23/2010   Family history of adverse reaction to anesthesia    Mother and brother hard to wake up after anesthesia   Fibroid    GASTRIC POLYP 08/04/2007   GERD 08/04/2007   GOITER, MULTINODULAR 03/10/2009   Headache(784.0) 11/13/2008   History of kidney cancer    2023   Hx of blood clots    HYPERLIPIDEMIA 04/06/2007   HYPERTENSION 04/06/2007   ISCHEMIC COLITIS, HX OF 07/06/2007   Lichen sclerosus 2014   Osteopenia 08/2018   T score -1.1 FRAX 7% / 0.5%   Palpitations 03/10/2009   Renal mass    Left kidney   Rosacea 11/30/2007   THYROID  NODULE, LEFT 11/04/2010   Urinary frequency    UTI'S, RECURRENT 11/30/2007   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 pectoral/subclavicular abscess drainage  09/20/2004   removed skull cyst  09/21/1971   right foot neuroma  09/20/1986   ROBOTIC ASSITED PARTIAL NEPHRECTOMY Left 04/16/2022   Procedure: XI ROBOTIC ASSITED PARTIAL NEPHRECTOMY;  Surgeon: Devere Lonni Righter, MD;  Location: [facility];  Service: Urology;  Laterality: Left;   uterine fibroids removed  09/20/1994    reports that she has never smoked. She has been exposed to tobacco smoke. She has never used smokeless tobacco. She reports current alcohol use. She reports that she does not use drugs. family history includes Breast cancer (age of onset: 69 - 25) in her paternal aunt and paternal aunt; Cancer in her cousin; Deep vein thrombosis in her brother; Diabetes in her mother; Emphysema in her father; Heart attack in her brother;  Heart disease in her father and mother; Hypertension in her mother; Lung cancer (age of onset: 6) in her maternal grandmother; Lung cancer (age of onset: 35) in her mother; Lung cancer (age of onset: 37) in her maternal uncle; Macular degeneration in her brother; Ovarian cancer (age of onset: 22 - 67) in her cousin; Stroke in her brother and father. Allergies  Allergen Reactions   Avelox [Moxifloxacin] Diarrhea   Cefuroxime Axetil Diarrhea   Ciprofloxacin Diarrhea   Crab Extract Diarrhea and Nausea And Vomiting    ONLY CRAB ALLERGY  (no other shellfish allergies)   Lipitor [Atorvastatin Calcium ] Other (See Comments)    Sensitive (extreme muscle aches)   Latex Rash    Band-aids if left on too long   Current  Outpatient Medications on File Prior to Visit  Medication Sig Dispense Refill   albuterol  (VENTOLIN  HFA) 108 (90 Base) MCG/ACT inhaler 1-2 puffs Inhalation every 4-6 hours prn cough/wheeze for 90 days     azelastine (OPTIVAR) 0.05 % ophthalmic solution 1 drop into affected eye Ophthalmic Twice a day for 90 days     Blood Glucose Monitoring Suppl (CONTOUR BLOOD GLUCOSE SYSTEM) w/Device KIT Use to check blood sugar once daily E11.9 1 kit 0   Cholecalciferol (VITAMIN D -3 PO) Take by mouth.     CONTOUR NEXT TEST test strip CHECK BLOOD SUGAR ONCE DAILY 100 strip 3   hydrOXYzine (ATARAX) 25 MG tablet TAKE 1 TABLET BY MOUTH EVERY 8 HOURS THREE TIMES A DAY for 90     Microlet Lancets MISC CHECK BLOOD SUGAR ONCE DAILY 100 each 3   potassium chloride  SA (KLOR-CON  M) 20 MEQ tablet TAKE 1 TABLET BY MOUTH TWICE  DAILY 180 tablet 3   rosuvastatin  (CRESTOR ) 40 MG tablet Take 1 tablet (40 mg total) by mouth daily. 90 tablet 3   Spacer/Aero-Holding Chambers (AEROCHAMBER HOLDING CHAMBER) DEVI as directed for 30 days     valsartan -hydrochlorothiazide  (DIOVAN -HCT) 320-25 MG tablet TAKE 1 TABLET BY MOUTH DAILY 90 tablet 3   ezetimibe  (ZETIA ) 10 MG tablet Take 1 tablet (10 mg total) by mouth daily. (Patient not taking: Reported on 04/03/2024) 90 tablet 3   levocetirizine (XYZAL) 5 MG tablet Take 5 mg by mouth every evening. (Patient not taking: Reported on 04/03/2024)     Menthol, Topical Analgesic, (BIOFREEZE EX) Apply 1 Application topically 3 (three) times daily as needed (muscle pain/aches.). (Patient not taking: Reported on 04/03/2024)     metFORMIN  (GLUCOPHAGE -XR) 500 MG 24 hr tablet Take 1 tablet (500 mg total) by mouth daily with breakfast. (Patient not taking: Reported on 04/03/2024) 90 tablet 3   prednisoLONE acetate (PRED FORTE) 1 % ophthalmic suspension Place into the left eye. (Patient not taking: Reported on 04/03/2024)     UNABLE TO FIND Med Name: Instaflex Joint Support GNC (Patient not taking: Reported on  04/03/2024)     Vibegron  (GEMTESA ) 75 MG TABS 1 tab by mouth once daily (Patient not taking: Reported on 04/03/2024) 90 tablet 3   No current facility-administered medications on file prior to visit.        ROS:  All others reviewed and negative.  Objective        PE:  BP 110/72   Pulse 74   Temp 98.5 F (36.9 C)   Ht 5' 1 (1.549 m)   Wt 172 lb 3.2 oz (78.1 kg)   SpO2 92%   BMI 32.54 kg/m  Constitutional: Pt appears in NAD               HENT: Head: NCAT.                Right Ear: External ear normal.                 Left Ear: External ear normal.                Eyes: . Pupils are equal, round, and reactive to light. Conjunctivae and EOM are normal               Nose: without d/c or deformity               Neck: Neck supple. Gross normal ROM               Cardiovascular: Normal rate and regular rhythm.                 Pulmonary/Chest: Effort normal and breath sounds without rales or wheezing.                Abd:  Soft, NT, ND, + BS, no organomegaly               Neurological: Pt is alert. At baseline orientation, motor grossly intact               Skin: Skin is warm. No rashes, no other new lesions, LE edema - none               Psychiatric: Pt behavior is normal without agitation   Micro: none  Cardiac tracings I have personally interpreted today:  none  Pertinent Radiological findings (summarize): none   Lab Results  Component Value Date   WBC 6.4 12/23/2023   HGB 12.7 12/23/2023   HCT 37.9 12/23/2023   PLT 217.0 12/23/2023   GLUCOSE 132 (H) 12/23/2023   CHOL 114 12/23/2023   TRIG 128.0 12/23/2023   HDL 40.70 12/23/2023   LDLDIRECT 188.0 03/01/2023   LDLCALC 48 12/23/2023   ALT 37 (H) 12/23/2023   AST 30 12/23/2023   NA 137 12/23/2023   K 3.8 12/23/2023   CL 100 12/23/2023   CREATININE 0.76 12/23/2023   BUN 19 12/23/2023   CO2 26 12/23/2023   TSH 1.04 12/23/2023   HGBA1C 7.6 (H) 12/23/2023   MICROALBUR 1.4 12/23/2023   Assessment/Plan:   [name] [name] is a 68 y.o. White or Caucasian [1] female with  has a past medical history of ALLERGIC RHINITIS (04/06/2007), ANXIETY (04/06/2007), Arthritis, Cataract, CIN I (cervical intraepithelial neoplasia I), COLONIC POLYPS (08/04/2007), Diabetes mellitus without complication (HCC), DIARRHEA, ACUTE (09/23/2010), Family history of adverse reaction to anesthesia, Fibroid, GASTRIC POLYP (08/04/2007), GERD (08/04/2007), GOITER, MULTINODULAR (03/10/2009), Headache(784.0) (11/13/2008), History of kidney cancer, blood clots, HYPERLIPIDEMIA (04/06/2007), HYPERTENSION (04/06/2007), ISCHEMIC COLITIS, HX OF (07/06/2007), Lichen sclerosus (2014), Osteopenia (08/2018), Palpitations (03/10/2009), Renal mass, Rosacea (11/30/2007), THYROID  NODULE, LEFT (11/04/2010), Urinary frequency, UTI'S, RECURRENT (11/30/2007), and 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  Urinary urgency With worsening subjective after out of preventive UTI med - exam benign, but will need UA and Cx, with tx pending results.   Also gave refill of caphalexin preventive to start if culture neg  Essential hypertension BP Readings from Last 3 Encounters:  04/03/24 110/72  12/23/23 120/76  10/26/23 108/60   Stable, pt to continue medical treatment diovan  hct 320 25 qd   Diabetes Lab Results  Component Value Date  HGBA1C 7.6 (H) 12/23/2023   uncontrolled, pt to restart metformin  ER 500 mg qd   Hyperlipidemia Lab Results  Component Value Date   LDLCALC 48 12/23/2023   Stable, pt to continue current statin crestor  40 mg qd  Followup: No follow-ups on file.  Lynwood Rush, MD 04/03/2024 9:02 PM [facility] [place] [facility] - [facility]

## 2024-04-03 NOTE — Patient Instructions (Signed)
 Ok to restart the preventive antibiotic  Please continue all other medications as before, and refills have been done if requested.  Please have the pharmacy call with any other refills you may need.  Please keep your appointments with your specialists as you may have planned  Please go to the LAB at the blood drawing area for the tests to be done - just the urine only  You will be contacted by phone if any changes need to be made immediately.

## 2024-04-04 LAB — URINE CULTURE

## 2024-05-09 ENCOUNTER — Ambulatory Visit: Admitting: Internal Medicine

## 2024-05-10 ENCOUNTER — Other Ambulatory Visit (INDEPENDENT_AMBULATORY_CARE_PROVIDER_SITE_OTHER)

## 2024-05-10 DIAGNOSIS — E1165 Type 2 diabetes mellitus with hyperglycemia: Secondary | ICD-10-CM | POA: Diagnosis not present

## 2024-05-10 DIAGNOSIS — E559 Vitamin D deficiency, unspecified: Secondary | ICD-10-CM | POA: Diagnosis not present

## 2024-05-10 DIAGNOSIS — N390 Urinary tract infection, site not specified: Secondary | ICD-10-CM

## 2024-05-10 DIAGNOSIS — E538 Deficiency of other specified B group vitamins: Secondary | ICD-10-CM | POA: Diagnosis not present

## 2024-05-10 LAB — URINALYSIS, ROUTINE W REFLEX MICROSCOPIC
Bilirubin Urine: NEGATIVE
Hgb urine dipstick: NEGATIVE
Ketones, ur: NEGATIVE
Leukocytes,Ua: NEGATIVE
Nitrite: NEGATIVE
RBC / HPF: NONE SEEN (ref 0–?)
Specific Gravity, Urine: <=1.005 — AB (ref 1.000–1.030)
Total Protein, Urine: NEGATIVE
Urine Glucose: NEGATIVE
Urobilinogen, UA: 0.2 (ref 0.0–1.0)
WBC, UA: NONE SEEN (ref 0–?)
pH: 6 (ref 5.0–8.0)

## 2024-05-10 LAB — HEPATIC FUNCTION PANEL
ALT: 30 U/L (ref 0–35)
AST: 24 U/L (ref 0–37)
Albumin: 4.4 g/dL (ref 3.5–5.2)
Alkaline Phosphatase: 68 U/L (ref 39–117)
Bilirubin, Direct: 0.1 mg/dL (ref 0.0–0.3)
Total Bilirubin: 0.5 mg/dL (ref 0.2–1.2)
Total Protein: 7.1 g/dL (ref 6.0–8.3)

## 2024-05-10 LAB — BASIC METABOLIC PANEL WITH GFR
BUN: 15 mg/dL (ref 6–23)
CO2: 25 meq/L (ref 19–32)
Calcium: 9.3 mg/dL (ref 8.4–10.5)
Chloride: 101 meq/L (ref 96–112)
Creatinine, Ser: 0.73 mg/dL (ref 0.40–1.20)
GFR: 84.62 mL/min (ref >60.00)
Glucose, Bld: 140 mg/dL — ABNORMAL HIGH (ref 70–99)
Potassium: 3.6 meq/L (ref 3.5–5.1)
Sodium: 137 meq/L (ref 135–145)

## 2024-05-10 LAB — LIPID PANEL
Cholesterol: 107 mg/dL (ref 0–200)
HDL: 43.1 mg/dL (ref >39.00)
LDL Cholesterol: 42 mg/dL (ref 0–99)
NonHDL: 64.1
Total CHOL/HDL Ratio: 2
Triglycerides: 112 mg/dL (ref 0.0–149.0)
VLDL: 22.4 mg/dL (ref 0.0–40.0)

## 2024-05-10 LAB — CBC WITH DIFFERENTIAL/PLATELET
Basophils Absolute: 0.1 K/uL (ref 0.0–0.1)
Basophils Relative: 1.3 % (ref 0.0–3.0)
Eosinophils Absolute: 0.3 K/uL (ref 0.0–0.7)
Eosinophils Relative: 4.1 % (ref 0.0–5.0)
HCT: 36 % (ref 36.0–46.0)
Hemoglobin: 12.3 g/dL (ref 12.0–15.0)
Lymphocytes Relative: 38.6 % (ref 12.0–46.0)
Lymphs Abs: 2.6 K/uL (ref 0.7–4.0)
MCHC: 34.2 g/dL (ref 30.0–36.0)
MCV: 81.3 fl (ref 78.0–100.0)
Monocytes Absolute: 0.6 K/uL (ref 0.1–1.0)
Monocytes Relative: 9.6 % (ref 3.0–12.0)
Neutro Abs: 3.1 K/uL (ref 1.4–7.7)
Neutrophils Relative %: 46.4 % (ref 43.0–77.0)
Platelets: 194 K/uL (ref 150.0–400.0)
RBC: 4.43 Mil/uL (ref 3.87–5.11)
RDW: 14.1 % (ref 11.5–15.5)
WBC: 6.7 K/uL (ref 4.0–10.5)

## 2024-05-10 LAB — MICROALBUMIN / CREATININE URINE RATIO
Creatinine,U: 27.5 mg/dL
Microalb Creat Ratio: UNDETERMINED mg/g (ref 0.0–30.0)
Microalb, Ur: <0.7 mg/dL

## 2024-05-10 LAB — VITAMIN B12: Vitamin B-12: 595 pg/mL (ref 211–911)

## 2024-05-10 LAB — VITAMIN D 25 HYDROXY (VIT D DEFICIENCY, FRACTURES): VITD: 86.25 ng/mL (ref 30.00–100.00)

## 2024-05-10 LAB — HEMOGLOBIN A1C: Hgb A1c MFr Bld: 7.6 % — ABNORMAL HIGH (ref 4.6–6.5)

## 2024-05-10 LAB — TSH: TSH: 1.59 u[IU]/mL (ref 0.35–5.50)

## 2024-05-11 LAB — URINE CULTURE
MICRO NUMBER:: 16864555
Result:: NO GROWTH
SPECIMEN QUALITY:: ADEQUATE

## 2024-05-12 ENCOUNTER — Ambulatory Visit: Payer: Self-pay | Admitting: Internal Medicine

## 2024-05-15 ENCOUNTER — Encounter: Payer: Self-pay | Admitting: Internal Medicine

## 2024-05-15 ENCOUNTER — Ambulatory Visit: Admitting: Internal Medicine

## 2024-05-15 VITALS — BP 142/78 | HR 75 | Temp 97.8°F | Ht 61.0 in | Wt 171.0 lb

## 2024-05-15 DIAGNOSIS — Z7984 Long term (current) use of oral hypoglycemic drugs: Secondary | ICD-10-CM | POA: Diagnosis not present

## 2024-05-15 DIAGNOSIS — I10 Essential (primary) hypertension: Secondary | ICD-10-CM

## 2024-05-15 DIAGNOSIS — E559 Vitamin D deficiency, unspecified: Secondary | ICD-10-CM

## 2024-05-15 DIAGNOSIS — Z0001 Encounter for general adult medical examination with abnormal findings: Secondary | ICD-10-CM

## 2024-05-15 DIAGNOSIS — E119 Type 2 diabetes mellitus without complications: Secondary | ICD-10-CM

## 2024-05-15 DIAGNOSIS — E7800 Pure hypercholesterolemia, unspecified: Secondary | ICD-10-CM

## 2024-05-15 NOTE — Patient Instructions (Addendum)
 Please have your flu shot at the pharmacy  Please call if you would like to start the Mounjaro 2.5 mg weekly  Please monitor your BP at home on a regular basis  Please continue all other medications as before, and refills have been done if requested.  Please have the pharmacy call with any other refills you may need.  Please continue your efforts at being more active, low cholesterol diet, and weight control.  You are otherwise up to date with prevention measures today.  Please keep your appointments with your specialists as you may have planned  Please make an Appointment to return in 6 months, or sooner if needed, also with Lab Appointment for testing done 3-5 days before at the FIRST FLOOR Lab (so this is for TWO appointments - please see the scheduling desk as you leave)

## 2024-05-15 NOTE — Assessment & Plan Note (Signed)
 Lab Results  Component Value Date   HGBA1C 7.6 (H) 05/10/2024   Now uncontrolled, pt has declined to start metformin  ER 500 mg 1 every day, and will consider starting mounjaro 2.5 mg but will let us  know

## 2024-05-15 NOTE — Assessment & Plan Note (Signed)
 BP Readings from Last 3 Encounters:  05/15/24 (!) 142/78  04/03/24 110/72  12/23/23 120/76   Uncontrolled, but pt does not want med change, pt to continue medical treatment diovan  hct 320 25 mg qd

## 2024-05-15 NOTE — Progress Notes (Signed)
 Patient ID: [name] [name], female   DOB: 1956/04/26, 68 y.o.   MRN: [id number]         Chief Complaint:: wellness exam and dm, htn, hld       HPI:  [name] [name] is a 68 y.o. female here for wellness exam; plans to retire soon; for flu shot at pharmacy                        Also Pt denies chest pain, increased sob or doe, wheezing, orthopnea, PND, increased LE swelling, palpitations, dizziness or syncope.    Pt denies polydipsia, polyuria, or new focal neuro s/s.    Pt denies fever, wt loss, night sweats, loss of appetite, or other constitutional symptoms   Wt Readings from Last 3 Encounters:  05/15/24 171 lb (77.6 kg)  04/03/24 172 lb 3.2 oz (78.1 kg)  12/23/23 172 lb (78 kg)   BP Readings from Last 3 Encounters:  05/15/24 (!) 142/78  04/03/24 110/72  12/23/23 120/76   Immunization History  Administered Date(s) Administered   Fluad Quad(high Dose 65+) 07/10/2023   INFLUENZA, HIGH DOSE SEASONAL PF 12/09/2016, 11/30/2018, 11/29/2019, 11/25/2020, 11/26/2021   Influenza Split 09/06/2016   Influenza Whole 06/20/2009, 06/20/2010   Influenza,inj,Quad PF,6+ Mos 08/14/2019   Influenza,inj,Quad PF,6-35 Mos 07/04/2017   Influenza-Unspecified 05/21/2012, 05/21/2014, 06/25/2015, 07/15/2017, 06/22/2022   PFIZER(Purple Top)SARS-COV-2 Vaccination 11/01/2019, 11/22/2019, 08/23/2020   Pneumococcal Conjugate-13 10/05/2017   Pneumococcal Polysaccharide-23 08/28/2014, 09/12/2015, 12/09/2016, 11/30/2018, 11/29/2019, 11/25/2020, 11/26/2021   Td 03/20/1996, 09/15/2009   Tdap 05/06/2020   Zoster Recombinant(Shingrix) 12/15/2020, 04/03/2021   Health Maintenance Due  Topic Date Due   INFLUENZA VACCINE  04/20/2024      Past Medical History:  Diagnosis Date   ALLERGIC RHINITIS 04/06/2007   ANXIETY 04/06/2007   Arthritis    Cataract    CIN I (cervical intraepithelial neoplasia I)    COLONIC POLYPS 08/04/2007   Diabetes mellitus without complication (HCC)    DIARRHEA, ACUTE 09/23/2010    Family history of adverse reaction to anesthesia    Mother and brother hard to wake up after anesthesia   Fibroid    GASTRIC POLYP 08/04/2007   GERD 08/04/2007   GOITER, MULTINODULAR 03/10/2009   Headache(784.0) 11/13/2008   History of kidney cancer    2023   Hx of blood clots    HYPERLIPIDEMIA 04/06/2007   HYPERTENSION 04/06/2007   ISCHEMIC COLITIS, HX OF 07/06/2007   Lichen sclerosus 2014   Osteopenia 08/2018   T score -1.1 FRAX 7% / 0.5%   Palpitations 03/10/2009   Renal mass    Left kidney   Rosacea 11/30/2007   THYROID  NODULE, LEFT 11/04/2010   Urinary frequency    UTI'S, RECURRENT 11/30/2007   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 pectoral/subclavicular abscess drainage  09/20/2004   removed skull cyst  09/21/1971   right foot neuroma  09/20/1986   ROBOTIC ASSITED PARTIAL  NEPHRECTOMY Left 04/16/2022   Procedure: XI ROBOTIC ASSITED PARTIAL NEPHRECTOMY;  Surgeon: Devere Lonni Righter, MD;  Location: [facility];  Service: Urology;  Laterality: Left;   uterine fibroids removed  09/20/1994    reports that she has never smoked. She has been exposed to tobacco smoke. She has never used smokeless tobacco. She reports current alcohol use. She reports that she does not use drugs. family history includes Breast cancer (age of onset: 74 - 49) in her paternal aunt and paternal aunt; Cancer in her cousin; Deep vein thrombosis in her brother; Diabetes in her mother; Emphysema in her father;  Heart attack in her brother; Heart disease in her father and mother; Hypertension in her mother; Lung cancer (age of onset: 73) in her maternal grandmother; Lung cancer (age of onset: 6) in her mother; Lung cancer (age of onset: 21) in her maternal uncle; Macular degeneration in her brother; Ovarian cancer (age of onset: 38 - 59) in her cousin; Stroke in her brother and father. Allergies  Allergen Reactions   Avelox [Moxifloxacin] Diarrhea   Cefuroxime Axetil Diarrhea   Ciprofloxacin Diarrhea   Crab Extract Diarrhea and Nausea And Vomiting    ONLY CRAB ALLERGY  (no other shellfish allergies)   Lipitor [Atorvastatin Calcium ] Other (See Comments)    Sensitive (extreme muscle aches)   Latex Rash    Band-aids if left on too long   Current Outpatient Medications on File Prior to Visit  Medication Sig Dispense Refill   albuterol  (VENTOLIN  HFA) 108 (90 Base) MCG/ACT inhaler 1-2 puffs Inhalation every 4-6 hours prn cough/wheeze for 90 days     azelastine (OPTIVAR) 0.05 % ophthalmic solution 1 drop into affected eye Ophthalmic Twice a day for 90 days     Blood Glucose Monitoring Suppl (CONTOUR BLOOD GLUCOSE SYSTEM) w/Device KIT Use to check blood sugar once daily E11.9 1 kit 0   cephALEXin  (KEFLEX ) 500 MG capsule 1 tab by mouth three times weekly (mon - wed - fri) for prevention 36 capsule 2   Cholecalciferol (VITAMIN D -3 PO) Take by mouth.     CONTOUR NEXT TEST test strip CHECK BLOOD SUGAR ONCE DAILY 100 strip 3   Cyanocobalamin  (VITAMIN B-12 PO) Take by mouth.     hydrOXYzine (ATARAX) 25 MG tablet TAKE 1 TABLET BY MOUTH EVERY 8 HOURS THREE TIMES A DAY for 90     levocetirizine (XYZAL) 5 MG tablet Take 5 mg by mouth every evening.     Microlet Lancets MISC CHECK BLOOD SUGAR ONCE DAILY 100 each 3   potassium chloride  SA (KLOR-CON  M) 20 MEQ tablet TAKE 1 TABLET BY MOUTH TWICE  DAILY 180 tablet 3   rosuvastatin  (CRESTOR ) 40 MG tablet Take 1 tablet (40 mg total) by mouth daily. 90 tablet 3    Spacer/Aero-Holding Chambers (AEROCHAMBER HOLDING CHAMBER) DEVI as directed for 30 days     valsartan -hydrochlorothiazide  (DIOVAN -HCT) 320-25 MG tablet TAKE 1 TABLET BY MOUTH DAILY 90 tablet 3   Vibegron  (GEMTESA ) 75 MG TABS 1 tab by mouth once daily (Patient not taking: Reported on 05/15/2024) 90 tablet 3   No current facility-administered medications on file prior to visit.        ROS:  All others reviewed and negative.  Objective        PE:  BP (!) 142/78   Pulse 75   Temp 97.8 F (36.6 C) (Temporal)   Ht 5' 1 (1.549 m)   Wt 171 lb (77.6 kg)   SpO2 95%   BMI  32.31 kg/m                 Constitutional: Pt appears in NAD               HENT: Head: NCAT.                Right Ear: External ear normal.                 Left Ear: External ear normal.                Eyes: . Pupils are equal, round, and reactive to light. Conjunctivae and EOM are normal               Nose: without d/c or deformity               Neck: Neck supple. Gross normal ROM               Cardiovascular: Normal rate and regular rhythm.                 Pulmonary/Chest: Effort normal and breath sounds without rales or wheezing.                Abd:  Soft, NT, ND, + BS, no organomegaly               Neurological: Pt is alert. At baseline orientation, motor grossly intact               Skin: Skin is warm. No rashes, no other new lesions, LE edema - none               Psychiatric: Pt behavior is normal without agitation   Micro: none  Cardiac tracings I have personally interpreted today:  none  Pertinent Radiological findings (summarize): none   Lab Results  Component Value Date   WBC 6.7 05/10/2024   HGB 12.3 05/10/2024   HCT 36.0 05/10/2024   PLT 194.0 05/10/2024   GLUCOSE 140 (H) 05/10/2024   CHOL 107 05/10/2024   TRIG 112.0 05/10/2024   HDL 43.10 05/10/2024   LDLDIRECT 188.0 03/01/2023   LDLCALC 42 05/10/2024   ALT 30 05/10/2024   AST 24 05/10/2024   NA 137 05/10/2024   K 3.6 05/10/2024   CL 101  05/10/2024   CREATININE 0.73 05/10/2024   BUN 15 05/10/2024   CO2 25 05/10/2024   TSH 1.59 05/10/2024   HGBA1C 7.6 (H) 05/10/2024   MICROALBUR <0.7 05/10/2024   Assessment/Plan:  [name] [name] is a 68 y.o. White or Caucasian [1] female with  has a past medical history of ALLERGIC RHINITIS (04/06/2007), ANXIETY (04/06/2007), Arthritis, Cataract, CIN I (cervical intraepithelial neoplasia I), COLONIC POLYPS (08/04/2007), Diabetes mellitus without complication (HCC), DIARRHEA, ACUTE (09/23/2010), Family history of adverse reaction to anesthesia, Fibroid, GASTRIC POLYP (08/04/2007), GERD (08/04/2007), GOITER, MULTINODULAR (03/10/2009), Headache(784.0) (11/13/2008), History of kidney cancer, blood clots, HYPERLIPIDEMIA (04/06/2007), HYPERTENSION (04/06/2007), ISCHEMIC COLITIS, HX OF (07/06/2007), Lichen sclerosus (2014), Osteopenia (08/2018), Palpitations (03/10/2009), Renal mass, Rosacea (11/30/2007), THYROID  NODULE, LEFT (11/04/2010), Urinary frequency, UTI'S, RECURRENT (11/30/2007), and 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  Encounter for well adult exam with abnormal findings Age and sex appropriate education and counseling updated with regular exercise and diet Referrals for preventative services - none needed Immunizations addressed - for flu shot at pharmacy Smoking counseling  - none needed Evidence for depression or other mood disorder - none significant Most recent labs reviewed. I have personally reviewed  and have noted: 1) the patient's medical and social history 2) The patient's current medications and supplements 3) The patient's height, weight, and BMI have been recorded in the chart   Controlled type 2 diabetes mellitus without complication (HCC) Lab Results  Component Value Date   HGBA1C 7.6 (H) 05/10/2024   Now uncontrolled, pt has declined to start metformin  ER 500 mg 1 every day, and will consider starting mounjaro 2.5 mg but will let us   know   Essential hypertension BP Readings from Last 3 Encounters:  05/15/24 (!) 142/78  04/03/24 110/72  12/23/23 120/76   Uncontrolled, but pt does not want med change, pt to continue medical treatment diovan  hct 320 25 mg qd   Hyperlipidemia Lab Results  Component Value Date   LDLCALC 42 05/10/2024   Stable, pt to continue current statin crestor  40 mg qd  Followup: Return in about 6 months (around 11/15/2024).  Lynwood Rush, MD 05/15/2024 7:51 PM [facility]

## 2024-05-15 NOTE — Assessment & Plan Note (Signed)
 Lab Results  Component Value Date   LDLCALC 42 05/10/2024   Stable, pt to continue current statin crestor  40 mg qd

## 2024-05-15 NOTE — Assessment & Plan Note (Signed)
 Age and sex appropriate education and counseling updated with regular exercise and diet Referrals for preventative services - none needed Immunizations addressed - for flu shot at pharmacy Smoking counseling  - none needed Evidence for depression or other mood disorder - none significant Most recent labs reviewed. I have personally reviewed and have noted: 1) the patient's medical and social history 2) The patient's current medications and supplements 3) The patient's height, weight, and BMI have been recorded in the chart

## 2024-08-14 ENCOUNTER — Ambulatory Visit (HOSPITAL_COMMUNITY)
Admission: RE | Admit: 2024-08-14 | Discharge: 2024-08-14 | Disposition: A | Discharge status: Home / Self Care | Source: Ambulatory Visit | Attending: Urology | Admitting: Urology

## 2024-08-14 ENCOUNTER — Other Ambulatory Visit (HOSPITAL_COMMUNITY): Payer: Self-pay | Admitting: Urology

## 2024-08-14 DIAGNOSIS — C642 Malignant neoplasm of left kidney, except renal pelvis: Secondary | ICD-10-CM

## 2024-09-28 ENCOUNTER — Other Ambulatory Visit: Payer: Self-pay | Admitting: Obstetrics and Gynecology

## 2024-09-28 DIAGNOSIS — Z1231 Encounter for screening mammogram for malignant neoplasm of breast: Secondary | ICD-10-CM

## 2024-11-05 ENCOUNTER — Ambulatory Visit: Payer: 59 | Admitting: Obstetrics and Gynecology

## 2024-11-05 ENCOUNTER — Ambulatory Visit

## 2024-11-13 ENCOUNTER — Other Ambulatory Visit

## 2024-11-15 ENCOUNTER — Encounter

## 2024-11-15 ENCOUNTER — Ambulatory Visit: Admitting: Internal Medicine
